# Patient Record
Sex: Female | Born: 1960 | Race: White | Hispanic: No | Marital: Married | Smoking: Never smoker
Health system: Southern US, Community
[De-identification: ages and names within clinical notes are randomized; demographics above are authoritative.]

## PROBLEM LIST (undated history)

## (undated) DIAGNOSIS — G43109 Migraine with aura, not intractable, without status migrainosus: Secondary | ICD-10-CM

## (undated) DIAGNOSIS — J45909 Unspecified asthma, uncomplicated: Secondary | ICD-10-CM

## (undated) DIAGNOSIS — Z9889 Other specified postprocedural states: Secondary | ICD-10-CM

## (undated) DIAGNOSIS — R112 Nausea with vomiting, unspecified: Secondary | ICD-10-CM

## (undated) DIAGNOSIS — L509 Urticaria, unspecified: Secondary | ICD-10-CM

## (undated) DIAGNOSIS — E7800 Pure hypercholesterolemia, unspecified: Secondary | ICD-10-CM

## (undated) DIAGNOSIS — C50919 Malignant neoplasm of unspecified site of unspecified female breast: Secondary | ICD-10-CM

## (undated) DIAGNOSIS — E0590 Thyrotoxicosis, unspecified without thyrotoxic crisis or storm: Secondary | ICD-10-CM

## (undated) DIAGNOSIS — N900 Mild vulvar dysplasia: Secondary | ICD-10-CM

## (undated) DIAGNOSIS — J069 Acute upper respiratory infection, unspecified: Secondary | ICD-10-CM

## (undated) HISTORY — DX: Migraine with aura, not intractable, without status migrainosus: G43.109

## (undated) HISTORY — DX: Malignant neoplasm of unspecified site of unspecified female breast: C50.919

## (undated) HISTORY — DX: Mild vulvar dysplasia: N90.0

## (undated) HISTORY — DX: Urticaria, unspecified: L50.9

## (undated) HISTORY — DX: Thyrotoxicosis, unspecified without thyrotoxic crisis or storm: E05.90

## (undated) HISTORY — PX: MASTECTOMY: SHX3

## (undated) HISTORY — PX: AUGMENTATION MAMMAPLASTY: SUR837

## (undated) HISTORY — PX: BREAST SURGERY: SHX581

## (undated) HISTORY — DX: Pure hypercholesterolemia, unspecified: E78.00

## (undated) HISTORY — DX: Acute upper respiratory infection, unspecified: J06.9

---

## 1997-09-11 ENCOUNTER — Other Ambulatory Visit: Admission: RE | Admit: 1997-09-11 | Discharge: 1997-09-11 | Payer: Self-pay | Admitting: Gynecology

## 1998-07-13 ENCOUNTER — Other Ambulatory Visit: Admission: RE | Admit: 1998-07-13 | Discharge: 1998-07-13 | Payer: Self-pay | Admitting: Gynecology

## 1999-09-19 ENCOUNTER — Other Ambulatory Visit: Admission: RE | Admit: 1999-09-19 | Discharge: 1999-09-19 | Payer: Self-pay | Admitting: Gynecology

## 2001-01-12 ENCOUNTER — Other Ambulatory Visit: Admission: RE | Admit: 2001-01-12 | Discharge: 2001-01-12 | Payer: Self-pay | Admitting: Gynecology

## 2001-01-21 ENCOUNTER — Ambulatory Visit (HOSPITAL_COMMUNITY): Admission: RE | Admit: 2001-01-21 | Discharge: 2001-01-21 | Payer: Self-pay | Admitting: Gynecology

## 2001-01-21 ENCOUNTER — Encounter: Payer: Self-pay | Admitting: Gynecology

## 2001-12-28 ENCOUNTER — Other Ambulatory Visit: Admission: RE | Admit: 2001-12-28 | Discharge: 2001-12-28 | Payer: Self-pay | Admitting: Gynecology

## 2002-02-09 ENCOUNTER — Encounter: Payer: Self-pay | Admitting: Gynecology

## 2002-02-09 ENCOUNTER — Ambulatory Visit (HOSPITAL_COMMUNITY): Admission: RE | Admit: 2002-02-09 | Discharge: 2002-02-09 | Payer: Self-pay | Admitting: Gynecology

## 2003-01-17 ENCOUNTER — Other Ambulatory Visit: Admission: RE | Admit: 2003-01-17 | Discharge: 2003-01-17 | Payer: Self-pay | Admitting: Gynecology

## 2003-01-30 ENCOUNTER — Encounter: Admission: RE | Admit: 2003-01-30 | Discharge: 2003-01-30 | Payer: Self-pay | Admitting: Family Medicine

## 2003-01-30 ENCOUNTER — Encounter (INDEPENDENT_AMBULATORY_CARE_PROVIDER_SITE_OTHER): Payer: Self-pay | Admitting: *Deleted

## 2003-01-30 ENCOUNTER — Encounter: Payer: Self-pay | Admitting: Family Medicine

## 2003-05-04 ENCOUNTER — Encounter: Admission: RE | Admit: 2003-05-04 | Discharge: 2003-05-04 | Payer: Self-pay | Admitting: Gynecology

## 2004-02-19 ENCOUNTER — Other Ambulatory Visit: Admission: RE | Admit: 2004-02-19 | Discharge: 2004-02-19 | Payer: Self-pay | Admitting: Obstetrics and Gynecology

## 2004-05-31 ENCOUNTER — Ambulatory Visit (HOSPITAL_COMMUNITY): Admission: RE | Admit: 2004-05-31 | Discharge: 2004-05-31 | Payer: Self-pay | Admitting: Gynecology

## 2004-12-17 DIAGNOSIS — N900 Mild vulvar dysplasia: Secondary | ICD-10-CM

## 2004-12-17 HISTORY — DX: Mild vulvar dysplasia: N90.0

## 2005-02-19 ENCOUNTER — Other Ambulatory Visit: Admission: RE | Admit: 2005-02-19 | Discharge: 2005-02-19 | Payer: Self-pay | Admitting: Gynecology

## 2005-08-21 ENCOUNTER — Ambulatory Visit (HOSPITAL_COMMUNITY): Admission: RE | Admit: 2005-08-21 | Discharge: 2005-08-21 | Payer: Self-pay | Admitting: Gynecology

## 2005-09-05 ENCOUNTER — Encounter: Admission: RE | Admit: 2005-09-05 | Discharge: 2005-09-05 | Payer: Self-pay | Admitting: Gynecology

## 2006-02-23 ENCOUNTER — Other Ambulatory Visit: Admission: RE | Admit: 2006-02-23 | Discharge: 2006-02-23 | Payer: Self-pay | Admitting: Gynecology

## 2006-05-19 HISTORY — PX: CARDIAC CATHETERIZATION: SHX172

## 2006-06-25 ENCOUNTER — Ambulatory Visit: Payer: Self-pay | Admitting: Family Medicine

## 2006-07-17 ENCOUNTER — Ambulatory Visit: Payer: Self-pay | Admitting: Family Medicine

## 2006-07-29 ENCOUNTER — Ambulatory Visit: Payer: Self-pay | Admitting: Cardiology

## 2006-07-29 LAB — CONVERTED CEMR LAB
BUN: 10 mg/dL (ref 6–23)
Basophils Absolute: 0 10*3/uL (ref 0.0–0.1)
Basophils Relative: 0.4 % (ref 0.0–1.0)
CO2: 25 meq/L (ref 19–32)
Calcium: 9.3 mg/dL (ref 8.4–10.5)
Chloride: 105 meq/L (ref 96–112)
Cholesterol: 183 mg/dL (ref 0–200)
Creatinine, Ser: 0.8 mg/dL (ref 0.4–1.2)
Eosinophils Absolute: 0.1 10*3/uL (ref 0.0–0.6)
Eosinophils Relative: 1.2 % (ref 0.0–5.0)
GFR calc Af Amer: 100 mL/min
GFR calc non Af Amer: 82 mL/min
Glucose, Bld: 97 mg/dL (ref 70–99)
HCT: 39.6 % (ref 36.0–46.0)
HDL: 60.8 mg/dL (ref >39.0)
Hemoglobin: 13.9 g/dL (ref 12.0–15.0)
Hgb A1c MFr Bld: 5.4 % (ref 4.6–6.0)
INR: 0.9 (ref 0.9–2.0)
LDL Cholesterol: 91 mg/dL (ref 0–99)
Lymphocytes Relative: 23.9 % (ref 12.0–46.0)
MCHC: 35 g/dL (ref 30.0–36.0)
MCV: 89.8 fL (ref 78.0–100.0)
Monocytes Absolute: 0.3 10*3/uL (ref 0.2–0.7)
Monocytes Relative: 5.2 % (ref 3.0–11.0)
Neutro Abs: 4.6 10*3/uL (ref 1.4–7.7)
Neutrophils Relative %: 69.3 % (ref 43.0–77.0)
Platelets: 285 10*3/uL (ref 150–400)
Potassium: 3.9 meq/L (ref 3.5–5.1)
Prothrombin Time: 11.7 s (ref 10.0–14.0)
RBC: 4.42 M/uL (ref 3.87–5.11)
RDW: 11.9 % (ref 11.5–14.6)
Sodium: 140 meq/L (ref 135–145)
Total CHOL/HDL Ratio: 3
Triglycerides: 154 mg/dL — ABNORMAL HIGH (ref 0–149)
VLDL: 31 mg/dL (ref 0–40)
WBC: 6.6 10*3/uL (ref 4.5–10.5)
aPTT: 29.5 s (ref 26.5–36.5)

## 2006-08-04 ENCOUNTER — Inpatient Hospital Stay (HOSPITAL_BASED_OUTPATIENT_CLINIC_OR_DEPARTMENT_OTHER): Admission: RE | Admit: 2006-08-04 | Discharge: 2006-08-04 | Payer: Self-pay | Admitting: Cardiovascular Disease

## 2006-08-04 ENCOUNTER — Ambulatory Visit: Payer: Self-pay | Admitting: Cardiovascular Disease

## 2006-08-14 ENCOUNTER — Ambulatory Visit: Payer: Self-pay

## 2006-08-14 ENCOUNTER — Encounter: Payer: Self-pay | Admitting: Cardiology

## 2006-09-10 ENCOUNTER — Ambulatory Visit: Payer: Self-pay | Admitting: Cardiology

## 2007-02-25 ENCOUNTER — Other Ambulatory Visit: Admission: RE | Admit: 2007-02-25 | Discharge: 2007-02-25 | Payer: Self-pay | Admitting: Gynecology

## 2008-03-17 ENCOUNTER — Ambulatory Visit: Payer: Self-pay | Admitting: Gynecology

## 2008-03-17 ENCOUNTER — Encounter: Payer: Self-pay | Admitting: Gynecology

## 2008-03-17 ENCOUNTER — Other Ambulatory Visit: Admission: RE | Admit: 2008-03-17 | Discharge: 2008-03-17 | Payer: Self-pay | Admitting: Gynecology

## 2008-06-07 ENCOUNTER — Ambulatory Visit: Payer: Self-pay | Admitting: Gynecology

## 2008-06-13 ENCOUNTER — Encounter: Admission: RE | Admit: 2008-06-13 | Discharge: 2008-06-13 | Payer: Self-pay | Admitting: Gynecology

## 2008-10-19 ENCOUNTER — Ambulatory Visit: Payer: Self-pay | Admitting: Gynecology

## 2008-11-16 ENCOUNTER — Ambulatory Visit: Payer: Self-pay | Admitting: Gynecology

## 2009-01-12 DIAGNOSIS — Z8742 Personal history of other diseases of the female genital tract: Secondary | ICD-10-CM | POA: Insufficient documentation

## 2009-01-12 DIAGNOSIS — K219 Gastro-esophageal reflux disease without esophagitis: Secondary | ICD-10-CM | POA: Insufficient documentation

## 2009-01-12 DIAGNOSIS — R1110 Vomiting, unspecified: Secondary | ICD-10-CM | POA: Insufficient documentation

## 2009-01-12 DIAGNOSIS — R110 Nausea: Secondary | ICD-10-CM | POA: Insufficient documentation

## 2009-01-12 DIAGNOSIS — R109 Unspecified abdominal pain: Secondary | ICD-10-CM | POA: Insufficient documentation

## 2009-01-18 ENCOUNTER — Ambulatory Visit: Payer: Self-pay | Admitting: Internal Medicine

## 2009-01-18 DIAGNOSIS — R195 Other fecal abnormalities: Secondary | ICD-10-CM | POA: Insufficient documentation

## 2009-01-18 DIAGNOSIS — R635 Abnormal weight gain: Secondary | ICD-10-CM | POA: Insufficient documentation

## 2009-01-18 DIAGNOSIS — K5900 Constipation, unspecified: Secondary | ICD-10-CM | POA: Insufficient documentation

## 2009-01-19 LAB — CONVERTED CEMR LAB
ALT: 24 units/L (ref 0–35)
AST: 21 units/L (ref 0–37)
Albumin: 3.9 g/dL (ref 3.5–5.2)
Alkaline Phosphatase: 56 units/L (ref 39–117)
Basophils Absolute: 0 10*3/uL (ref 0.0–0.1)
Basophils Relative: 0.1 % (ref 0.0–3.0)
Bilirubin, Direct: 0.1 mg/dL (ref 0.0–0.3)
Eosinophils Absolute: 0.1 10*3/uL (ref 0.0–0.7)
Eosinophils Relative: 1.1 % (ref 0.0–5.0)
HCT: 38.1 % (ref 36.0–46.0)
Hemoglobin: 13.2 g/dL (ref 12.0–15.0)
Lymphocytes Relative: 28.7 % (ref 12.0–46.0)
Lymphs Abs: 1.7 10*3/uL (ref 0.7–4.0)
MCHC: 34.7 g/dL (ref 30.0–36.0)
MCV: 92.1 fL (ref 78.0–100.0)
Monocytes Absolute: 0.3 10*3/uL (ref 0.1–1.0)
Monocytes Relative: 5.4 % (ref 3.0–12.0)
Neutro Abs: 3.7 10*3/uL (ref 1.4–7.7)
Neutrophils Relative %: 64.7 % (ref 43.0–77.0)
Platelets: 250 10*3/uL (ref 150.0–400.0)
RBC: 4.14 M/uL (ref 3.87–5.11)
RDW: 12.2 % (ref 11.5–14.6)
Total Bilirubin: 0.6 mg/dL (ref 0.3–1.2)
Total Protein: 7.2 g/dL (ref 6.0–8.3)
WBC: 5.8 10*3/uL (ref 4.5–10.5)

## 2009-01-23 ENCOUNTER — Ambulatory Visit (HOSPITAL_COMMUNITY): Admission: RE | Admit: 2009-01-23 | Discharge: 2009-01-23 | Payer: Self-pay | Admitting: Internal Medicine

## 2009-03-28 ENCOUNTER — Encounter: Payer: Self-pay | Admitting: Gynecology

## 2009-03-28 ENCOUNTER — Other Ambulatory Visit: Admission: RE | Admit: 2009-03-28 | Discharge: 2009-03-28 | Payer: Self-pay | Admitting: Gynecology

## 2009-03-28 ENCOUNTER — Ambulatory Visit: Payer: Self-pay | Admitting: Gynecology

## 2009-08-19 ENCOUNTER — Emergency Department (HOSPITAL_COMMUNITY): Admission: EM | Admit: 2009-08-19 | Discharge: 2009-08-19 | Payer: Self-pay | Admitting: Emergency Medicine

## 2010-04-01 ENCOUNTER — Ambulatory Visit: Payer: Self-pay | Admitting: Gynecology

## 2010-04-01 ENCOUNTER — Other Ambulatory Visit: Admission: RE | Admit: 2010-04-01 | Discharge: 2010-04-01 | Payer: Self-pay | Admitting: Gynecology

## 2010-06-08 ENCOUNTER — Encounter: Payer: Self-pay | Admitting: Gynecology

## 2010-06-09 ENCOUNTER — Encounter: Payer: Self-pay | Admitting: Gynecology

## 2010-06-09 ENCOUNTER — Encounter: Payer: Self-pay | Admitting: Sports Medicine

## 2010-06-18 NOTE — Assessment & Plan Note (Signed)
Summary: nausea,burning in throat,vomitting,stomach pain.Marland Kitchenem   History of Present Illness Visit Type: new patient  Primary GI MD: Lina Sar MD Primary Provider: Colin Broach, MD  Requesting Provider: n/a Chief Complaint: Pt states BRB in stool after BM's, nausea, vomiting, and lower abd pain History of Present Illness:   This is a 50 year old white female with several months history of burning epigastric pain and nocturnal abdominal pain radiating to the back. She has developed severe food intolerances. She had an attack of abdominal pain after having barbecue for supper. There is no fever or jaundice. There is strong family history of gallbladder disease in patient's mother, father and sister. She also has occasional dysphagia to liquids and solids but mostly solids. Patient has taken TUMS with only partial relief. There has been a significant weight gain recently. She also complains of constipation. Patient denies any stress.   GI Review of Systems    Reports abdominal pain, acid reflux, bloating, heartburn, nausea, vomiting, and  weight gain.     Location of  Abdominal pain: lower abdomen.    Denies belching, chest pain, dysphagia with liquids, dysphagia with solids, loss of appetite, vomiting blood, and  weight loss.      Reports constipation, hemorrhoids, and  rectal bleeding.     Denies anal fissure, black tarry stools, change in bowel habit, diarrhea, diverticulosis, fecal incontinence, heme positive stool, irritable bowel syndrome, jaundice, light color stool, liver problems, and  rectal pain.    Current Medications (verified): 1)  None  Allergies (verified): No Known Drug Allergies  Past History:  Past Medical History: Current Problems:  WEIGHT GAIN (ICD-783.1) BLOOD IN STOOL, OCCULT (ICD-792.1) CONSTIPATION (ICD-564.00) GERD (ICD-530.81) ABDOMINAL PAIN, UNSPECIFIED SITE (ICD-789.00) VOMITING (ICD-787.03) NAUSEA (ICD-787.02)  DIABETES MELLITUS, GESTATIONAL, HX OF (ICD-V13.29)      Past Surgical History: Reviewed history from 01/12/2009 and no changes required. Unremarkable  Family History: Family History of Heart Disease: Father Family History of Diabetes: Mom, Dad, and Sister  Family History of Colon Cancer:MGF   Social History: Patient has never smoked.  Alcohol Use - no Illicit Drug Use - no Occupation: Self employed  married 3 childern  Daily Caffeine Use: 2 daily   Review of Systems       The patient complains of back pain and night sweats.  The patient denies allergy/sinus, anemia, anxiety-new, arthritis/joint pain, blood in urine, breast changes/lumps, change in vision, confusion, cough, coughing up blood, depression-new, fainting, fatigue, fever, headaches-new, hearing problems, heart murmur, heart rhythm changes, itching, menstrual pain, muscle pains/cramps, nosebleeds, pregnancy symptoms, shortness of breath, skin rash, sleeping problems, sore throat, swelling of feet/legs, swollen lymph glands, thirst - excessive , urination - excessive , urination changes/pain, urine leakage, vision changes, and voice change.         Pertinent positive and negative review of systems were noted in the above HPI. All other ROS was otherwise negative.   Vital Signs:  Patient profile:   50 year old female Height:      65 inches Weight:      163 pounds BMI:     27.22 BSA:     1.82 Pulse rate:   74 / minute Pulse rhythm:   regular BP sitting:   132 / 80  (left arm) Cuff size:   regular  Vitals Entered By: Ok Anis CMA (January 18, 2009 9:33 AM)  Physical Exam  General:  very nice, pleasant, alert and oriented. Eyes:  nonicteric. Mouth:  no ulcers. Neck:  Supple; no  masses or thyromegaly. Lungs:  Clear throughout to auscultation. Heart:  Regular rate and rhythm; no murmurs, rubs,  or bruits.  Abdomen:  soft abdomen with diffuse tenderness in all quadrants but more so in the epigastrium and along the right costal margin with more pain on inspiration. No CVA tenderness. Rectal:  deferred due to patient being on her menstrual cycle.   Impression & Recommendations:  Problem # 1:  BLOOD IN STOOL, OCCULT (ICD-792.1) Patient will have a CBC today. A rectal exam could not be done due to patient having her menstrual cycle.  Problem # 2:  ABDOMINAL PAIN, UNSPECIFIED SITE (ICD-789.00) Patient's dyspepsia and abdominal pain is suggestive of either biliary dysfunction or gastritis. We need to rule out peptic ulcer disease. She will be started on Prevacid 15 mg a day and will be scheduled for an upper endoscopy. We will obtain liver function tests and a CBC as well. Orders: TLB-CBC Platelet - w/Differential (85025-CBCD) TLB-Hepatic/Liver Function Pnl (80076-HEPATIC) EGD (EGD) Ultrasound Abdomen (UAS)  Patient Instructions: 1)  Prevacid 15 mg by mouth once daily. 2)  Abdominal ultrasound. 3)  Liver function tests and CBC today. 4)  Low-fat diet. 5)  Upper endoscopy with biopsies for H. pylori. Prescriptions: PROMETHAZINE HCL 25 MG TABS (PROMETHAZINE HCL) Take 1 tablet by mouth every 6 hours as needed for nausea  #20 x 0   Entered by:   Hortense Ramal CMA (AAMA)   Authorized by:   Hart Carwin MD   Signed by:   Hortense Ramal CMA (AAMA) on 01/18/2009   Method used:   Electronically to        [facility]  [address] [phone]* (retail)       [address]       [place], [place]  [address]       Ph: [phone]       Fax: [phone]   RxID:   [id number]

## 2010-08-02 ENCOUNTER — Emergency Department (HOSPITAL_COMMUNITY): Payer: BC Managed Care – PPO

## 2010-08-02 ENCOUNTER — Emergency Department (HOSPITAL_COMMUNITY)
Admission: EM | Admit: 2010-08-02 | Discharge: 2010-08-03 | Disposition: A | Discharge status: Home / Self Care | Payer: BC Managed Care – PPO | Attending: Emergency Medicine | Admitting: Emergency Medicine

## 2010-08-02 DIAGNOSIS — R42 Dizziness and giddiness: Secondary | ICD-10-CM | POA: Insufficient documentation

## 2010-08-02 DIAGNOSIS — I10 Essential (primary) hypertension: Secondary | ICD-10-CM | POA: Insufficient documentation

## 2010-08-02 DIAGNOSIS — R0602 Shortness of breath: Secondary | ICD-10-CM | POA: Insufficient documentation

## 2010-08-02 DIAGNOSIS — E7800 Pure hypercholesterolemia, unspecified: Secondary | ICD-10-CM | POA: Insufficient documentation

## 2010-08-02 DIAGNOSIS — R55 Syncope and collapse: Secondary | ICD-10-CM | POA: Insufficient documentation

## 2010-08-02 DIAGNOSIS — R079 Chest pain, unspecified: Secondary | ICD-10-CM | POA: Insufficient documentation

## 2010-08-02 DIAGNOSIS — Z79899 Other long term (current) drug therapy: Secondary | ICD-10-CM | POA: Insufficient documentation

## 2010-08-02 DIAGNOSIS — R110 Nausea: Secondary | ICD-10-CM | POA: Insufficient documentation

## 2010-08-02 LAB — POCT I-STAT, CHEM 8
BUN: 11 mg/dL (ref 6–23)
Calcium, Ion: 1.03 mmol/L — ABNORMAL LOW (ref 1.12–1.32)
Chloride: 106 mEq/L (ref 96–112)
Creatinine, Ser: 0.8 mg/dL (ref 0.4–1.2)
Glucose, Bld: 111 mg/dL — ABNORMAL HIGH (ref 70–99)
HCT: 40 % (ref 36.0–46.0)
Hemoglobin: 13.6 g/dL (ref 12.0–15.0)
Potassium: 3.1 mEq/L — ABNORMAL LOW (ref 3.5–5.1)
Sodium: 137 mEq/L (ref 135–145)
TCO2: 19 mmol/L (ref 0–100)

## 2010-08-02 LAB — CBC
HCT: 38.2 % (ref 36.0–46.0)
Hemoglobin: 13 g/dL (ref 12.0–15.0)
MCH: 29.7 pg (ref 26.0–34.0)
MCHC: 34 g/dL (ref 30.0–36.0)
MCV: 87.2 fL (ref 78.0–100.0)
Platelets: 236 10*3/uL (ref 150–400)
RBC: 4.38 MIL/uL (ref 3.87–5.11)
RDW: 12.5 % (ref 11.5–15.5)
WBC: 9.7 10*3/uL (ref 4.0–10.5)

## 2010-08-02 LAB — DIFFERENTIAL
Basophils Absolute: 0 10*3/uL (ref 0.0–0.1)
Basophils Relative: 0 % (ref 0–1)
Eosinophils Absolute: 0.1 10*3/uL (ref 0.0–0.7)
Eosinophils Relative: 1 % (ref 0–5)
Lymphocytes Relative: 23 % (ref 12–46)
Lymphs Abs: 2.3 10*3/uL (ref 0.7–4.0)
Monocytes Absolute: 0.5 10*3/uL (ref 0.1–1.0)
Monocytes Relative: 5 % (ref 3–12)
Neutro Abs: 6.9 10*3/uL (ref 1.7–7.7)
Neutrophils Relative %: 71 % (ref 43–77)

## 2010-08-02 LAB — APTT: aPTT: 27 seconds (ref 24–37)

## 2010-08-02 LAB — POCT CARDIAC MARKERS
CKMB, poc: <1 ng/mL — ABNORMAL LOW (ref 1.0–8.0)
Myoglobin, poc: 115 ng/mL (ref 12–200)
Troponin i, poc: 0.1 ng/mL — ABNORMAL HIGH (ref 0.00–0.09)

## 2010-08-02 LAB — PROTIME-INR
INR: 0.98 (ref 0.00–1.49)
Prothrombin Time: 13.2 seconds (ref 11.6–15.2)

## 2010-08-02 LAB — CK TOTAL AND CKMB (NOT AT ARMC)
CK, MB: 0.9 ng/mL (ref 0.3–4.0)
Relative Index: INVALID (ref 0.0–2.5)
Total CK: 68 U/L (ref 7–177)

## 2010-08-02 LAB — TROPONIN I: Troponin I: 0.01 ng/mL (ref 0.00–0.06)

## 2010-08-07 LAB — DIFFERENTIAL
Basophils Absolute: 0 10*3/uL (ref 0.0–0.1)
Basophils Relative: 1 % (ref 0–1)
Eosinophils Absolute: 0.1 10*3/uL (ref 0.0–0.7)
Eosinophils Relative: 1 % (ref 0–5)
Lymphocytes Relative: 25 % (ref 12–46)
Lymphs Abs: 1.8 10*3/uL (ref 0.7–4.0)
Monocytes Absolute: 0.3 10*3/uL (ref 0.1–1.0)
Monocytes Relative: 4 % (ref 3–12)
Neutro Abs: 5.2 10*3/uL (ref 1.7–7.7)
Neutrophils Relative %: 70 % (ref 43–77)

## 2010-08-07 LAB — POCT I-STAT, CHEM 8
BUN: 10 mg/dL (ref 6–23)
Calcium, Ion: 1.13 mmol/L (ref 1.12–1.32)
Chloride: 106 mEq/L (ref 96–112)
Creatinine, Ser: 0.5 mg/dL (ref 0.4–1.2)
Glucose, Bld: 102 mg/dL — ABNORMAL HIGH (ref 70–99)
HCT: 43 % (ref 36.0–46.0)
Hemoglobin: 14.6 g/dL (ref 12.0–15.0)
Potassium: 3.4 mEq/L — ABNORMAL LOW (ref 3.5–5.1)
Sodium: 139 mEq/L (ref 135–145)
TCO2: 23 mmol/L (ref 0–100)

## 2010-08-07 LAB — POCT CARDIAC MARKERS
CKMB, poc: <1 ng/mL — ABNORMAL LOW (ref 1.0–8.0)
CKMB, poc: <1 ng/mL — ABNORMAL LOW (ref 1.0–8.0)
Myoglobin, poc: 31.4 ng/mL (ref 12–200)
Myoglobin, poc: 48 ng/mL (ref 12–200)
Troponin i, poc: <0.05 ng/mL (ref 0.00–0.09)
Troponin i, poc: <0.05 ng/mL (ref 0.00–0.09)

## 2010-08-07 LAB — CBC
HCT: 41.2 % (ref 36.0–46.0)
Hemoglobin: 14.2 g/dL (ref 12.0–15.0)
MCHC: 34.5 g/dL (ref 30.0–36.0)
MCV: 91.6 fL (ref 78.0–100.0)
Platelets: 254 10*3/uL (ref 150–400)
RBC: 4.49 MIL/uL (ref 3.87–5.11)
RDW: 12.5 % (ref 11.5–15.5)
WBC: 7.4 10*3/uL (ref 4.0–10.5)

## 2010-08-07 LAB — PROTIME-INR
INR: 0.99 (ref 0.00–1.49)
Prothrombin Time: 13 seconds (ref 11.6–15.2)

## 2010-08-07 LAB — POCT PREGNANCY, URINE: Preg Test, Ur: NEGATIVE

## 2010-08-07 LAB — URINALYSIS, ROUTINE W REFLEX MICROSCOPIC
Bilirubin Urine: NEGATIVE
Glucose, UA: NEGATIVE mg/dL
Hgb urine dipstick: NEGATIVE
Ketones, ur: NEGATIVE mg/dL
Nitrite: NEGATIVE
Protein, ur: NEGATIVE mg/dL
Specific Gravity, Urine: 1.012 (ref 1.005–1.030)
Urobilinogen, UA: 0.2 mg/dL (ref 0.0–1.0)
pH: 5.5 (ref 5.0–8.0)

## 2010-08-12 ENCOUNTER — Emergency Department (HOSPITAL_COMMUNITY)
Admission: EM | Admit: 2010-08-12 | Discharge: 2010-08-12 | Disposition: A | Discharge status: Home / Self Care | Payer: BC Managed Care – PPO | Attending: Emergency Medicine | Admitting: Emergency Medicine

## 2010-08-12 DIAGNOSIS — R197 Diarrhea, unspecified: Secondary | ICD-10-CM | POA: Insufficient documentation

## 2010-08-12 DIAGNOSIS — R002 Palpitations: Secondary | ICD-10-CM | POA: Insufficient documentation

## 2010-08-12 DIAGNOSIS — I10 Essential (primary) hypertension: Secondary | ICD-10-CM | POA: Insufficient documentation

## 2010-08-12 DIAGNOSIS — E7800 Pure hypercholesterolemia, unspecified: Secondary | ICD-10-CM | POA: Insufficient documentation

## 2010-08-12 DIAGNOSIS — R109 Unspecified abdominal pain: Secondary | ICD-10-CM | POA: Insufficient documentation

## 2010-08-12 DIAGNOSIS — R10819 Abdominal tenderness, unspecified site: Secondary | ICD-10-CM | POA: Insufficient documentation

## 2010-08-12 DIAGNOSIS — R112 Nausea with vomiting, unspecified: Secondary | ICD-10-CM | POA: Insufficient documentation

## 2010-08-12 DIAGNOSIS — Z79899 Other long term (current) drug therapy: Secondary | ICD-10-CM | POA: Insufficient documentation

## 2010-08-12 DIAGNOSIS — R079 Chest pain, unspecified: Secondary | ICD-10-CM | POA: Insufficient documentation

## 2010-08-12 LAB — CBC
HCT: 39.9 % (ref 36.0–46.0)
Hemoglobin: 13.7 g/dL (ref 12.0–15.0)
MCH: 30.8 pg (ref 26.0–34.0)
MCHC: 34.3 g/dL (ref 30.0–36.0)
MCV: 89.7 fL (ref 78.0–100.0)
Platelets: 221 10*3/uL (ref 150–400)
RBC: 4.45 MIL/uL (ref 3.87–5.11)
RDW: 12.7 % (ref 11.5–15.5)
WBC: 10.8 10*3/uL — ABNORMAL HIGH (ref 4.0–10.5)

## 2010-08-12 LAB — DIFFERENTIAL
Basophils Absolute: 0 10*3/uL (ref 0.0–0.1)
Basophils Relative: 0 % (ref 0–1)
Eosinophils Absolute: 0.1 10*3/uL (ref 0.0–0.7)
Eosinophils Relative: 1 % (ref 0–5)
Lymphocytes Relative: 6 % — ABNORMAL LOW (ref 12–46)
Lymphs Abs: 0.7 10*3/uL (ref 0.7–4.0)
Monocytes Absolute: 0.6 10*3/uL (ref 0.1–1.0)
Monocytes Relative: 5 % (ref 3–12)
Neutro Abs: 9.5 10*3/uL — ABNORMAL HIGH (ref 1.7–7.7)
Neutrophils Relative %: 88 % — ABNORMAL HIGH (ref 43–77)

## 2010-08-12 LAB — URINALYSIS, ROUTINE W REFLEX MICROSCOPIC
Bilirubin Urine: NEGATIVE
Glucose, UA: NEGATIVE mg/dL
Hgb urine dipstick: NEGATIVE
Ketones, ur: NEGATIVE mg/dL
Nitrite: NEGATIVE
Protein, ur: NEGATIVE mg/dL
Specific Gravity, Urine: 1.016 (ref 1.005–1.030)
Urobilinogen, UA: 0.2 mg/dL (ref 0.0–1.0)
pH: 7 (ref 5.0–8.0)

## 2010-08-12 LAB — POCT CARDIAC MARKERS
CKMB, poc: <1 ng/mL — ABNORMAL LOW (ref 1.0–8.0)
Myoglobin, poc: 34.5 ng/mL (ref 12–200)
Troponin i, poc: <0.05 ng/mL (ref 0.00–0.09)

## 2010-08-12 LAB — COMPREHENSIVE METABOLIC PANEL
ALT: 18 U/L (ref 0–35)
AST: 19 U/L (ref 0–37)
Albumin: 3.8 g/dL (ref 3.5–5.2)
Alkaline Phosphatase: 54 U/L (ref 39–117)
BUN: 17 mg/dL (ref 6–23)
CO2: 25 mEq/L (ref 19–32)
Calcium: 8.6 mg/dL (ref 8.4–10.5)
Chloride: 103 mEq/L (ref 96–112)
Creatinine, Ser: 0.67 mg/dL (ref 0.4–1.2)
GFR calc Af Amer: >60 mL/min (ref >60)
GFR calc non Af Amer: >60 mL/min (ref >60)
Glucose, Bld: 117 mg/dL — ABNORMAL HIGH (ref 70–99)
Potassium: 3.8 mEq/L (ref 3.5–5.1)
Sodium: 137 mEq/L (ref 135–145)
Total Bilirubin: 0.5 mg/dL (ref 0.3–1.2)
Total Protein: 6.4 g/dL (ref 6.0–8.3)

## 2010-08-15 ENCOUNTER — Other Ambulatory Visit (HOSPITAL_COMMUNITY): Payer: Self-pay | Admitting: Gastroenterology

## 2010-08-15 DIAGNOSIS — R112 Nausea with vomiting, unspecified: Secondary | ICD-10-CM

## 2010-08-15 DIAGNOSIS — R1084 Generalized abdominal pain: Secondary | ICD-10-CM

## 2010-08-26 ENCOUNTER — Ambulatory Visit
Admission: RE | Admit: 2010-08-26 | Discharge: 2010-08-26 | Disposition: A | Discharge status: Home / Self Care | Payer: BC Managed Care – PPO | Source: Ambulatory Visit | Attending: Gastroenterology | Admitting: Gastroenterology

## 2010-08-26 ENCOUNTER — Other Ambulatory Visit: Payer: Self-pay | Admitting: Gastroenterology

## 2010-08-26 DIAGNOSIS — R110 Nausea: Secondary | ICD-10-CM

## 2010-08-28 ENCOUNTER — Other Ambulatory Visit (HOSPITAL_COMMUNITY): Payer: BC Managed Care – PPO

## 2010-09-04 ENCOUNTER — Other Ambulatory Visit (HOSPITAL_COMMUNITY): Payer: BC Managed Care – PPO

## 2010-09-04 ENCOUNTER — Encounter (HOSPITAL_COMMUNITY): Payer: Self-pay

## 2010-09-04 ENCOUNTER — Encounter (HOSPITAL_COMMUNITY)
Admission: RE | Admit: 2010-09-04 | Discharge: 2010-09-04 | Disposition: A | Discharge status: Home / Self Care | Payer: BC Managed Care – PPO | Source: Ambulatory Visit | Attending: Gastroenterology | Admitting: Gastroenterology

## 2010-09-04 DIAGNOSIS — R112 Nausea with vomiting, unspecified: Secondary | ICD-10-CM | POA: Insufficient documentation

## 2010-09-04 DIAGNOSIS — R1084 Generalized abdominal pain: Secondary | ICD-10-CM

## 2010-09-04 DIAGNOSIS — R109 Unspecified abdominal pain: Secondary | ICD-10-CM | POA: Insufficient documentation

## 2010-09-04 MED ORDER — TECHNETIUM TC 99M MEBROFENIN IV KIT
5.3000 | PACK | Freq: Once | INTRAVENOUS | Status: AC | PRN
  Administered 2010-09-04: 5.3 via INTRAVENOUS

## 2010-09-04 MED ORDER — SINCALIDE 5 MCG IJ SOLR: 0.0200 ug/kg | Freq: Once | INTRAMUSCULAR | Status: DC

## 2010-09-05 ENCOUNTER — Other Ambulatory Visit (HOSPITAL_COMMUNITY): Payer: Self-pay | Admitting: Gastroenterology

## 2010-09-05 DIAGNOSIS — R1011 Right upper quadrant pain: Secondary | ICD-10-CM

## 2010-09-05 DIAGNOSIS — R112 Nausea with vomiting, unspecified: Secondary | ICD-10-CM

## 2010-09-10 ENCOUNTER — Other Ambulatory Visit: Payer: Self-pay | Admitting: Dermatology

## 2010-09-18 ENCOUNTER — Inpatient Hospital Stay (HOSPITAL_COMMUNITY): Admission: RE | Admit: 2010-09-18 | Payer: BC Managed Care – PPO | Source: Ambulatory Visit

## 2010-09-25 ENCOUNTER — Inpatient Hospital Stay (HOSPITAL_COMMUNITY): Admission: RE | Admit: 2010-09-25 | Payer: BC Managed Care – PPO | Source: Ambulatory Visit

## 2010-10-04 NOTE — Assessment & Plan Note (Signed)
[facility]                            CARDIOLOGY OFFICE NOTE   NAME:Brandy [name], Brandy [name]                        MRN:          [id number]  DATE:09/10/2006                            DOB:          03-05-1961    Brandy [name] returns today.  Her cardiac catheterization was normal.  There  was a question of some mild mitral valve prolapse.  She underwent a 2D  echocardiogram, which ruled out mitral valve prolapse.   She had been feeling dizzy and having headaches.   Her medicines are Toprol XL 25 mg a day.   Her blood pressure is elevated again today at 160/90.  I checked it with  a large cuff in the left arm, even after she had rested for 10 minutes.  Her heart rate is 90 and regular.  The rest of her exam is unchanged.   ASSESSMENT:  1. Essential hypertension.  2. Normal coronary arteries.  3. No evidence of mitral valve prolapse.   PLAN:  1. Increase Toprol XL to 50 mg a day.  2. Add lisinopril hydrochlorothiazide 10/12.5 q.a.m.  3. Check blood pressure cuffs at home.  4. Follow up with me in 2 weeks to review her blood pressure log, and      make any additional changes.     Thomas C. Daleen Squibb, MD, [facility]  Electronically Signed    TCW/MedQ  DD: 09/10/2006  DT: 09/10/2006  Job #: [id number]   cc:   Brandy A. Milinda Antis, MD

## 2010-10-04 NOTE — Cardiovascular Report (Signed)
NAME:  [name], [name]                 ACCOUNT NO.:  [id number]   MEDICAL RECORD NO.:  [id number]          PATIENT TYPE:  OIB   LOCATION:  1962                         FACILITY:  [facility]   PHYSICIAN:  Veverly Fells. Excell Seltzer, MD  DATE OF BIRTH:  1960-07-21   DATE OF PROCEDURE:  08/04/2006  DATE OF DISCHARGE:  08/04/2006                            CARDIAC CATHETERIZATION   PROCEDURE:  Left heart catheterization, selective coronary angiography,  left ventricular angiography.   Brandy [name] is a 50 year old woman who presented for evaluation of chest  discomfort.  She has a strong family history of premature coronary  artery disease in her father.  She has been experiencing frequent chest  pain over the past few weeks.  With her strong family history and  borderline hypertension and hyperlipidemia, she was referred for  diagnostic angiography.   Risks and indications of the procedure were reviewed with the patient.  Informed consent was obtained.  The right groin was prepped, draped and  anesthetized with 1% lidocaine.  A 4-French sheath was placed in the  right femoral artery using the modified Seldinger technique.  Multiple  views of the left and right coronary arteries were taken with standard 4-  French catheters.  Following selective coronary angiography, an angled  pigtail catheter was inserted into the left ventricle and pressures were  recorded.  A left ventriculogram was performed, then a pullback across  the aortic valve was done.  At the conclusion of the procedure, the  patient was taken to the recovery area, and there were no immediate  complications.   FINDINGS:  Aortic pressure 131/77 with a mean of 102, left ventricular  pressure 130/2 with an end-diastolic pressure of 6.   CORONARY ANGIOGRAPHY:  Left mainstem is angiographically normal.  It  bifurcates into the LAD and left circumflex.   The LAD is a large-caliber vessel that courses down to the LV apex.  It  gives off a  large diagonal branch from its midportion.  There are  multiple septal perforators.  There is no significant angiographic  disease in the LAD.   The left circumflex is large caliber.  It gives off a first OM branch,  as well as two posterolateral branches.  The true AV groove circumflex  is of medium caliber.  There is no significant angiographic disease  throughout the left circumflex system.   The right coronary artery is dominant.  It is a large vessel.  It  supplies a PDA and posterior AV segment that gives off one  posterolateral branch.  There is also an RV marginal branch from the  midportion of the right coronary artery.  There is no significant  angiographic disease in the right coronary artery.   Left ventriculogram performed in the 30 degree right anterior oblique  projection shows normal LV function with an estimated LVEF of 60%.  There is probable mitral valve prolapse present.   ASSESSMENT:  1. Normal coronary arteries.  2. Normal left ventricular function.  3. Probable mitral valve prolapse.   RECOMMENDATIONS:  Recommend a 2-D echocardiogram to rule out significant  mitral valve prolapse.  I do not appreciate any mitral regurgitation.  This may give an explanation for the patient's chest pain syndrome.  If  not, she may require further investigation into noncardiac chest pain.  The patient will follow-up with Dr. Milinda Antis as an outpatient.      Veverly Fells. Excell Seltzer, MD  Electronically Signed     MDC/MEDQ  D:  08/05/2006  T:  08/05/2006  Job:  [id number]   cc:   Thomas C. Wall, MD, [facility]  Marne A. Milinda Antis, MD

## 2010-10-04 NOTE — Assessment & Plan Note (Signed)
[facility]                            CARDIOLOGY OFFICE NOTE   NAME:[name], [name]                        MRN:          [id number]  DATE:07/29/2006                            DOB:          1961-04-08    I was asked by Dr. Milinda Antis to evaluate Brandy [name], a delightful 50-year-  old married white female with chest pressure and tightness going into  her neck and down her left arm.   HISTORY OF PRESENT ILLNESS:  Brandy [name] is a 50 year old married white  female from [place].  She is quite active and has 3 children; 1 is  getting ready to go to college in [place].   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  She works incredible hours.   Recently, she was walking about the house one evening and had sudden  onset of pressure, heaviness and tightness in her chest.  It went up  into her left shoulder and down her left arm.  It resolved on its own.  At the time, she did not have any reflux symptoms or indigestion.  She  has had some reflux on occasion, however.   Since that time, she has had some chest tightness and similar symptoms  with exertion; it has not been nearly as bad.  She has had no recent  symptoms.   PAST MEDICAL HISTORY:  She does not smoke, does not drink and does not  use any recreational drugs.  She does not know her lipid status.   She has had no surgery.   MEDICATIONS:  Her only medicine is Dianah Field, which is birth control.   FAMILY HISTORY:  Her father had coronary artery disease diagnosed at age  34; he has had 2 bypass procedures since then.  He is 17 and doing  poorly.   SOCIAL HISTORY:  As above.   REVIEW OF SYSTEMS:  Other than the HPI, she had asthma as a child.  She  also had gestational diabetes.   PHYSICAL EXAMINATION:  Her blood pressure is 150/93.  Her heart rate is  101 and regular.  EKG from the outside shows no acute changes.  She is 5  feet 5 inches and weighs 152  pounds.  HEENT:  Normocephalic, atraumatic.  PERRLA.  Extraocular movements  intact.  Sclerae are clear.  Facial symmetry is normal.  Dentition is  satisfactory.  Carotid upstrokes are equal bilaterally without bruits.  There is no  JVD.  Neck is supple.  Thyroid is not enlarged.  LUNGS:  Clear.  HEART:  Reveals a nondisplaced PMI.  She has a normal S1 and S2.  ABDOMEN:  Soft with good bowel sounds.  No midline bruit.  There is no  hepatomegaly.  EXTREMITIES:  No cyanosis, clubbing or edema.  Pulses are brisk.  NEUROLOGIC:  Exam is intact.  SKIN:  Intact.  MUSCULOSKELETAL:  Intact.   ASSESSMENT:  1. Exertional chest tightness with radiation to the left shoulder,      neck and down the arm.  She has had subsequent events  with some      exertion.  She has several cardiac risk factors including a strong      family history, probable hyperlipidemia, perhaps hypertension,      sedentary lifestyle and lots of stress.  2. Hypertension today.  3. Questionable hyperlipidemia.  4. History of gestational diabetes.   RECOMMENDATION:  I have recommended to Mrs. Sorenson that she have an  outpatient cardiac catheterization.  We will also get a comprehensive  metabolic panel, CBC, lipid panel and hemoglobin A1c.  I have started  her on aspirin as well as metoprolol 25 mg a day.  Indications, risks  and potential benefits have been discussed about the cath.  She agrees  to proceed early next week.   I have also talked to her about unstable angina or an acute coronary  syndrome and how to activate 911.     Thomas C. Daleen Squibb, MD, [facility]  Electronically Signed    TCW/MedQ  DD: 07/29/2006  DT: 07/31/2006  Job #: [id number]   cc:   Marne A. Milinda Antis, MD

## 2010-12-10 ENCOUNTER — Other Ambulatory Visit: Payer: Self-pay | Admitting: Gynecology

## 2010-12-10 DIAGNOSIS — Z1231 Encounter for screening mammogram for malignant neoplasm of breast: Secondary | ICD-10-CM

## 2010-12-17 ENCOUNTER — Ambulatory Visit: Payer: BC Managed Care – PPO

## 2011-03-28 ENCOUNTER — Encounter: Payer: Self-pay | Admitting: *Deleted

## 2011-03-28 DIAGNOSIS — N900 Mild vulvar dysplasia: Secondary | ICD-10-CM | POA: Insufficient documentation

## 2011-04-03 ENCOUNTER — Encounter: Payer: BC Managed Care – PPO | Admitting: Gynecology

## 2011-04-07 ENCOUNTER — Encounter: Payer: BC Managed Care – PPO | Admitting: Gynecology

## 2011-05-08 ENCOUNTER — Encounter: Payer: Self-pay | Admitting: Gynecology

## 2011-05-08 ENCOUNTER — Ambulatory Visit (INDEPENDENT_AMBULATORY_CARE_PROVIDER_SITE_OTHER): Payer: BC Managed Care – PPO | Admitting: Gynecology

## 2011-05-08 VITALS — BP 124/82 | Ht 65.0 in | Wt 155.0 lb

## 2011-05-08 DIAGNOSIS — F419 Anxiety disorder, unspecified: Secondary | ICD-10-CM

## 2011-05-08 DIAGNOSIS — F411 Generalized anxiety disorder: Secondary | ICD-10-CM

## 2011-05-08 DIAGNOSIS — Z01419 Encounter for gynecological examination (general) (routine) without abnormal findings: Secondary | ICD-10-CM

## 2011-05-08 MED ORDER — FLUOXETINE HCL 20 MG PO TABS: 20.0000 mg | ORAL_TABLET | Freq: Every day | ORAL | Status: DC

## 2011-05-08 NOTE — Progress Notes (Signed)
Brandy [name] 10/11/1960 [id number]        50 y.o.  for annual exam.  Menses continue heavy and she continues to contemplate hysterectomy.  Past medical history,surgical history, medications, allergies, family history and social history were all reviewed and documented in the EPIC chart. ROS:  Was performed and pertinent positives and negatives are included in the history.  Exam: chaperone present Filed Vitals:   05/08/11 0916  BP: 124/82   General appearance  Normal Skin grossly normal Head/Neck normal with no cervical or supraclavicular adenopathy thyroid normal Lungs  clear Cardiac RR, without RMG Abdominal  soft, nontender, without masses, organomegaly or hernia Breasts  examined lying and sitting without masses, retractions, discharge or axillary adenopathy. Pelvic  Ext/BUS/vagina  normal   Cervix  normal    Uterus  anteverted, normal size, shape and contour, midline and mobile nontender   Adnexa  Without masses or tenderness    Anus and perineum  normal   Rectovaginal  normal sphincter tone without palpated masses or tenderness.    Assessment/Plan:  50 y.o. female for annual exam.    1. Menorrhagia/dysmenorrhea. Menses continue heavy. She's been evaluated with hormonal studies sonohysterogram biopsy all which were negative. She continues to contemplate hysterectomy but is not ready to make that decision yet. She'll continue to monitor her cycles and follow up with me when she decides for definitive treatment. 2. Contraception. I again discussed her lack of contraception and the pregnancy risk and she understands and accepts this. Options have been discussed and declined. 3. Pap smear. She has no history of abnormal Pap smears. Her last Pap smear was 2011. I reviewed current screening guidelines and we'll plan on doing every 3 year Pap smears and no Pap smear was done today. 4. Breast health. SBE monthly reviewed. She has not had a mammogram in 2 years and she knows the  importance of doing so and agrees to schedule this.from 5. Anxiety. She was started on Prozac previously and has done well with this, wants to continue it, I refilled her times a year. 6. Health maintenance. She actively sees her primary who manages her medical issues and she'll continue to do so. No blood work was done today, this is all done through their office.     Dara Lords MD, 9:36 AM 05/08/2011

## 2011-05-08 NOTE — Patient Instructions (Signed)
Schedule mammogram. Follow for annual GYN exam in 1 year. Follow up sooner if you decide you want to proceed with hysterectomy.

## 2011-11-27 ENCOUNTER — Encounter: Payer: Self-pay | Admitting: Gynecology

## 2011-11-27 ENCOUNTER — Ambulatory Visit (INDEPENDENT_AMBULATORY_CARE_PROVIDER_SITE_OTHER): Payer: BC Managed Care – PPO | Admitting: Gynecology

## 2011-11-27 VITALS — BP 110/74

## 2011-11-27 DIAGNOSIS — N951 Menopausal and female climacteric states: Secondary | ICD-10-CM

## 2011-11-27 LAB — TSH: TSH: 2.636 u[IU]/mL (ref 0.350–4.500)

## 2011-11-27 MED ORDER — VENLAFAXINE HCL ER 37.5 MG PO CP24: 37.5000 mg | ORAL_CAPSULE | Freq: Every day | ORAL | Status: DC

## 2011-11-27 NOTE — Progress Notes (Signed)
Patient presents to discuss hormonal-type symptoms. She says she feels fatigued somewhat anxious having hot flushes some sweats. She's continuing on monthly heavy menses. No weight changes hair changes skin changes nausea vomiting diarrhea constipation. Had been on Prozac in the past but is on nothing from a psychoactive standpoint now.  Exam HEENT normal Lungs clear Cardiac regular rate no rubs murmurs or gallops  Assessment and plan: Hormonal-type perimenopausal symptoms. Still with regular menses. Will check TSH FSH. Options for management include observation, psychoactive medications such as Effexor/Paxil, HRT to include low-dose patch intermittent progesterone her continuous progesterone, low-dose oral contraceptives.  She is being followed for her hypertension although her blood pressure today is normal. She's also being seen for migraines. The risks of estrogen containing products to include the birth control pills and HRT of stroke heart attack DVT reviewed. I also discussed the increased risk of breast cancer with HRT. The issues of her migraines reviewed. After a lengthy discussion wait to try Effexor XR 37.5. She'll call me in a month we'll see how she's doing and then talk about dose adjustment to 75 mg. Side effect profile as well as suicide ideation precautions reviewed.

## 2011-11-27 NOTE — Patient Instructions (Signed)
Start on Effexor daily, call me in a month with follow up results. Office will call you with hormone results.

## 2011-11-28 LAB — FOLLICLE STIMULATING HORMONE: FSH: 10.3 m[IU]/mL

## 2011-12-02 ENCOUNTER — Telehealth: Payer: Self-pay | Admitting: *Deleted

## 2011-12-02 NOTE — Telephone Encounter (Signed)
Tell patient my preference would be to try the Effexor.  With her history of hypertension and migraine headaches am not sure low dose birth control pills would be the best choice particularly if she did not try Effexor first. 37.5 mg a low dose and I think she would be better off trying that oppose to birth control pills at this point.

## 2011-12-02 NOTE — Telephone Encounter (Signed)
Pt was giving Effexor xr 37.5 mg daily on office visit 11/27/11, pt decided she wold like to try birth control pills. Pt didn't like some of the side effects on the label. Please advise

## 2011-12-03 NOTE — Telephone Encounter (Signed)
Left message for pt to call.

## 2011-12-03 NOTE — Telephone Encounter (Signed)
Pt informed with the below note. 

## 2011-12-04 ENCOUNTER — Ambulatory Visit: Payer: BC Managed Care – PPO

## 2012-02-04 ENCOUNTER — Telehealth: Payer: Self-pay | Admitting: *Deleted

## 2012-02-04 MED ORDER — MEGESTROL ACETATE 40 MG PO TABS: 40.0000 mg | ORAL_TABLET | Freq: Two times a day (BID) | ORAL | Status: DC

## 2012-02-04 NOTE — Telephone Encounter (Signed)
Please make sure that patient does a home pregnancy test is negative. Since my partner will not be here to next week she's more than welcome to come in this week to be checked. If her pregnancy test is negative and she was to wait until next week to see Dr. Audie Box we can call her in Megace 40 mg one by mouth twice a day for 5 days until she sees him next week at which time he may want to do an endometrial biopsy.

## 2012-02-04 NOTE — Telephone Encounter (Signed)
Pt informed with all the below and will make ov with TF next week, rx sent to pharmacy.

## 2012-02-04 NOTE — Telephone Encounter (Signed)
Pt calling c/o bleeding x 6 weeks now, she had about 4 days of spotting then bleeding started back. Pt is calling requesting something to stop bleeding until TF returns to office to follow up. Please advise

## 2012-02-12 ENCOUNTER — Telehealth: Payer: Self-pay | Admitting: *Deleted

## 2012-02-12 MED ORDER — MEGESTROL ACETATE 20 MG PO TABS: 20.0000 mg | ORAL_TABLET | Freq: Every day | ORAL | Status: DC

## 2012-02-12 NOTE — Telephone Encounter (Signed)
Megace 20 mg daily, #15. Patient needs to have an appointment within a 2 week window to discuss and evaluate.

## 2012-02-12 NOTE — Telephone Encounter (Signed)
Follow up telephone encounter on 02/04/12. Pt took megace x 5 days, bleeding slowed down, but now starting to bleeding heavy again. Can pt have more megace until OV with you? She would like to schedule OV to discuss proceed with hysterectomy  as well. Please advise

## 2012-02-12 NOTE — Telephone Encounter (Signed)
Pt informed with the below note, she will make appointment. 

## 2012-02-26 ENCOUNTER — Ambulatory Visit (INDEPENDENT_AMBULATORY_CARE_PROVIDER_SITE_OTHER): Payer: BC Managed Care – PPO | Admitting: Gynecology

## 2012-02-26 ENCOUNTER — Encounter: Payer: Self-pay | Admitting: Gynecology

## 2012-02-26 DIAGNOSIS — N926 Irregular menstruation, unspecified: Secondary | ICD-10-CM

## 2012-02-26 DIAGNOSIS — N920 Excessive and frequent menstruation with regular cycle: Secondary | ICD-10-CM

## 2012-02-26 DIAGNOSIS — Z23 Encounter for immunization: Secondary | ICD-10-CM

## 2012-02-26 LAB — CBC WITH DIFFERENTIAL/PLATELET
Basophils Absolute: 0 10*3/uL (ref 0.0–0.1)
Basophils Relative: 1 % (ref 0–1)
Eosinophils Absolute: 0.1 10*3/uL (ref 0.0–0.7)
Eosinophils Relative: 2 % (ref 0–5)
HCT: 39.9 % (ref 36.0–46.0)
Hemoglobin: 13.8 g/dL (ref 12.0–15.0)
Lymphocytes Relative: 34 % (ref 12–46)
Lymphs Abs: 2 10*3/uL (ref 0.7–4.0)
MCH: 30.4 pg (ref 26.0–34.0)
MCHC: 34.6 g/dL (ref 30.0–36.0)
MCV: 87.9 fL (ref 78.0–100.0)
Monocytes Absolute: 0.3 10*3/uL (ref 0.1–1.0)
Monocytes Relative: 5 % (ref 3–12)
Neutro Abs: 3.3 10*3/uL (ref 1.7–7.7)
Neutrophils Relative %: 58 % (ref 43–77)
Platelets: 328 10*3/uL (ref 150–400)
RBC: 4.54 MIL/uL (ref 3.87–5.11)
RDW: 13.1 % (ref 11.5–15.5)
WBC: 5.7 10*3/uL (ref 4.0–10.5)

## 2012-02-26 MED ORDER — MEGESTROL ACETATE 20 MG PO TABS: 20.0000 mg | ORAL_TABLET | Freq: Every day | ORAL | Status: DC

## 2012-02-26 NOTE — Progress Notes (Signed)
Patient presents having bled on and off for the last 2 months. She has a history of menorrhagia and was planning hysterectomy but has postponed it over the last several years. She did have a biopsy with negative sonohysterogram several years ago and recently had normal TSH/FSH.  She was started on Megace for the bleeding over the phone and currently is on 20 mg daily. Bleeding has substantially declined.  Exam with Fleet Contras Asst. Abdomen soft nontender without masses guarding rebound organomegaly. Pelvic external BUS vagina with scant bleeding. Cervix normal. Uterus globoid grossly normal size midline mobile nontender. Adnexa without masses or tenderness  Assessment and plan: Menorrhagia now with irregular bleeding. We again discussed the situation. She does want to proceed with hysterectomy now wants to schedule this. The question is whether we should study her first to include ultrasound biopsy. I think if we're going to proceed with surgery within the next month or 2 that we do not need to do so as she is easy to examine and short of cancer it would not alter our plan. Given she was sampled several years ago which was negative and she was having regular monthly menses up until most recently I feel is reasonable to proceed with surgery. I discussed the ovarian conservation issue. The options to keep her ovaries for continued hormone production given she is 51 and the perimenopause question as to how much longer her ovaries will function versus the risk of ovarian cancer and benign ovarian disease. Alternative to remove her ovaries and the issues of hypoestrogenism accelerated cardiovascular risk in bone loss all reviewed. Patient desires to keep both ovaries and she accepts the risks of disease to include ovarian cancer in the future. I also discussed we will attempt vaginal hysterectomy given that she has had vaginal births but laparoscopic assisted up to including TAH possibilities were also reviewed if  surprise findings or complications during surgery. Patient wants to move toward scheduling. She'll stay on her Megace 20 mg daily through surgery we'll check CBC now given her bleeding history and she will follow up for a full preoperative consult

## 2012-02-26 NOTE — Patient Instructions (Signed)
Stay on Megace 20 mg daily through surgery. Office will contact you to arrange surgery.

## 2012-02-27 ENCOUNTER — Telehealth: Payer: Self-pay | Admitting: Gynecology

## 2012-02-27 NOTE — Telephone Encounter (Signed)
error 

## 2012-03-09 ENCOUNTER — Encounter (HOSPITAL_COMMUNITY): Payer: Self-pay | Admitting: Pharmacist

## 2012-03-15 ENCOUNTER — Encounter (HOSPITAL_COMMUNITY): Payer: Self-pay

## 2012-03-15 ENCOUNTER — Encounter (HOSPITAL_COMMUNITY)
Admission: RE | Admit: 2012-03-15 | Discharge: 2012-03-15 | Disposition: A | Discharge status: Home / Self Care | Payer: BC Managed Care – PPO | Source: Ambulatory Visit | Attending: Gynecology | Admitting: Gynecology

## 2012-03-15 DIAGNOSIS — Z01818 Encounter for other preprocedural examination: Secondary | ICD-10-CM | POA: Insufficient documentation

## 2012-03-15 DIAGNOSIS — Z01812 Encounter for preprocedural laboratory examination: Secondary | ICD-10-CM | POA: Insufficient documentation

## 2012-03-15 HISTORY — DX: Unspecified asthma, uncomplicated: J45.909

## 2012-03-15 LAB — CBC
HCT: 39.5 % (ref 36.0–46.0)
Hemoglobin: 13.5 g/dL (ref 12.0–15.0)
MCH: 30.1 pg (ref 26.0–34.0)
MCHC: 34.2 g/dL (ref 30.0–36.0)
MCV: 88 fL (ref 78.0–100.0)
Platelets: 262 10*3/uL (ref 150–400)
RBC: 4.49 MIL/uL (ref 3.87–5.11)
RDW: 12.5 % (ref 11.5–15.5)
WBC: 5.8 10*3/uL (ref 4.0–10.5)

## 2012-03-15 LAB — SURGICAL PCR SCREEN
MRSA, PCR: NEGATIVE
Staphylococcus aureus: NEGATIVE

## 2012-03-15 LAB — COMPREHENSIVE METABOLIC PANEL
ALT: 16 U/L (ref 0–35)
AST: 19 U/L (ref 0–37)
Albumin: 4.3 g/dL (ref 3.5–5.2)
Alkaline Phosphatase: 56 U/L (ref 39–117)
BUN: 11 mg/dL (ref 6–23)
CO2: 23 mEq/L (ref 19–32)
Calcium: 9.4 mg/dL (ref 8.4–10.5)
Chloride: 99 mEq/L (ref 96–112)
Creatinine, Ser: 0.62 mg/dL (ref 0.50–1.10)
GFR calc Af Amer: >90 mL/min (ref >90)
GFR calc non Af Amer: >90 mL/min (ref >90)
Glucose, Bld: 94 mg/dL (ref 70–99)
Potassium: 3.5 mEq/L (ref 3.5–5.1)
Sodium: 135 mEq/L (ref 135–145)
Total Bilirubin: 0.8 mg/dL (ref 0.3–1.2)
Total Protein: 7.2 g/dL (ref 6.0–8.3)

## 2012-03-15 NOTE — Patient Instructions (Addendum)
Your procedure is scheduled on:03/22/12  Enter through the Main Entrance at :6am Pick up desk phone and dial [phone] and inform us of your arrival.  Please call [phone] if you have any problems the morning of surgery.  Remember: Do not eat or drink after midnight:Sunday   Take these meds the morning of surgery with a sip of water:BP med DO NOT wear jewelry, eye make-up, lipstick,body lotion, or dark fingernail polish. Do not shave for 48 hours prior to surgery.  If you are to be admitted after surgery, leave suitcase in car until your room has been assigned. Patients discharged on the day of surgery will not be allowed to drive home.

## 2012-03-16 ENCOUNTER — Ambulatory Visit (INDEPENDENT_AMBULATORY_CARE_PROVIDER_SITE_OTHER): Payer: BC Managed Care – PPO | Admitting: Gynecology

## 2012-03-16 ENCOUNTER — Encounter: Payer: Self-pay | Admitting: Gynecology

## 2012-03-16 DIAGNOSIS — N920 Excessive and frequent menstruation with regular cycle: Secondary | ICD-10-CM

## 2012-03-16 DIAGNOSIS — N946 Dysmenorrhea, unspecified: Secondary | ICD-10-CM

## 2012-03-16 NOTE — Progress Notes (Signed)
Brandy [name] Oct 17, 1960 [id number]   Preoperative consultation  Chief complaint:  Frequent, heavy, painful periods   History of present illness: 51 y.o. G3P3 with long history of worsening menses where she will bleed every other to every third week lasting 7-8 days changing every 2 hours double protection bleedthrough episodes with significant cramping which has become socially unacceptable. Has most recently required Megace for menstrual suppression.  Has been evaluated in the past to include a negative sonohysterogram and normal endometrial biopsy and options for management to include hormone manipulation, Mirena IUD, endometrial ablation, hysterectomy. Patient elects for hysterectomy and will be admitted for [facility].  Past medical history,surgical history, medications, allergies, family history and social history were all reviewed and documented in the EPIC chart. ROS:  Was performed and pertinent positives and negatives are included in the history of present illness.  Exam:  Brandy [name] assistant General: well developed, well nourished female, no acute distress HEENT: normal  Lungs: clear to auscultation without wheezing, rales or rhonchi  Cardiac: regular rate without rubs, murmurs or gallops  Abdomen: soft, nontender without masses, guarding, rebound, organomegaly  Pelvic: external bus vagina: normal   Cervix: grossly normal  Uterus: normal size, midline and mobile, nontender  Adnexa: without masses or tenderness      Assessment/Plan:  51 y.o. G3P3 long history of worsening menses as described above. I again reviewed options as I have on multiple occasions in the past several years and the patient ultimately has decided to proceed with hysterectomy. The ovarian conservation issue was reviewed with her and the options to keep both ovaries, recognizing her age for continued hormone production and possible peri-/postmenopausal benefit versus risk of ovarian disease in the future to include pain,  cysts and ovarian cancer versus removing her ovaries now and the issues of symptoms accelerated cardiovascular risk/osteoporosis and possible HRT. The patient very strongly wants to keep both ovaries understanding and accepting the risks of ovarian disease up to and including ovarian cancer in the future although she does give me permission to remove one or both ovaries if significant disease is encountered at the time of surgery and is my best intraoperative decision to remove them she would accept this. Sexuality following hysterectomy reviewed and the risks of persistent orgasmic dysfunction and persistent dyspareunia discussed as well as the absolute and irreversible sterility associated with hysterectomy. The expected intraoperative/postoperative courses were discussed and she understands that we will attempt a TVH but at any time during the procedure if unexpected findings or complications arise I may convert this to an LAVH or TAH with larger incision and longer recovery and she understands and accepts this. The risks of infection to include prolonged antibiotics as well as abscess/hematoma formation requiring reoperation and drainage was reviewed. The risk of hemorrhage necessitating transfusion and the risks of transfusion discussed to include transfusion reaction hepatitis HIV mad cow disease and other unknown entities. Incisional complications requiring opening and draining of incisions and closure by secondary intention as well as long-term issues of keloid/cosmetics and hernia formation was discussed. The risk of inadvertent injury to internal organs including bowel bladder ureters vessels and nerves, either immediately recognized or delay recognized necessitating major exploratory reparative surgeries, future reparative surgeries bowel resection bladder repair ureteral damage repair ostomy formation was all discussed understood and accepted. The patient's questions were answered to her satisfaction she  is ready to proceed with surgery.    Dara Lords MD, 12:43 PM 03/16/2012

## 2012-03-16 NOTE — Patient Instructions (Signed)
Follow up for surgery as scheduled. Call if any questions before hand.

## 2012-03-16 NOTE — H&P (Signed)
Brandy [name] 09-01-1960 [id number]   History and Physical  Chief complaint:  Frequent, heavy, painful periods   History of present illness: 51 y.o. G3P3 with long history of worsening menses where she will bleed every other to every third week lasting 7-8 days changing every 2 hours double protection bleedthrough episodes with significant cramping which has become socially unacceptable. Has most recently required Megace for menstrual suppression.  Has been evaluated in the past to include a negative sonohysterogram and normal endometrial biopsy and options for management to include hormone manipulation, Mirena IUD, endometrial ablation, hysterectomy. Patient elects for hysterectomy and is admitted for [facility].  Past medical history,surgical history, medications, allergies, family history and social history were all reviewed and documented in the EPIC chart. ROS:  Was performed and pertinent positives and negatives are included in the history of present illness.  Exam:  [name] assistant General: well developed, well nourished female, no acute distress HEENT: normal  Lungs: clear to auscultation without wheezing, rales or rhonchi  Cardiac: regular rate without rubs, murmurs or gallops  Abdomen: soft, nontender without masses, guarding, rebound, organomegaly  Pelvic: external bus vagina: normal   Cervix: grossly normal  Uterus: normal size, midline and mobile, nontender  Adnexa: without masses or tenderness      Assessment/Plan:  51 y.o. G3P3 long history of worsening menses as described above. I again reviewed options as I have on multiple occasions in the past several years and the patient ultimately has decided to proceed with hysterectomy. The ovarian conservation issue was reviewed with her and the options to keep both ovaries, recognizing her age for continued hormone production and possible peri-/postmenopausal benefit versus risk of ovarian disease in the future to include pain, cysts and  ovarian cancer versus removing her ovaries now and the issues of symptoms accelerated cardiovascular risk/osteoporosis and possible HRT. The patient very strongly wants to keep both ovaries understanding and accepting the risks of ovarian disease up to and including ovarian cancer in the future although she does give me permission to remove one or both ovaries if significant disease is encountered at the time of surgery and is my best intraoperative decision to remove them she would accept this. Sexuality following hysterectomy reviewed and the risks of persistent orgasmic dysfunction and persistent dyspareunia discussed as well as the absolute and irreversible sterility associated with hysterectomy. The expected intraoperative/postoperative courses were discussed and she understands that we will attempt a TVH but at any time during the procedure if unexpected findings or complications arise I may convert this to an LAVH or TAH with larger incision and longer recovery and she understands and accepts this. The risks of infection to include prolonged antibiotics as well as abscess/hematoma formation requiring reoperation and drainage was reviewed. The risk of hemorrhage necessitating transfusion and the risks of transfusion discussed to include transfusion reaction hepatitis HIV mad cow disease and other unknown entities. Incisional complications requiring opening and draining of incisions and closure by secondary intention as well as long-term issues of keloid/cosmetics and hernia formation was discussed. The risk of inadvertent injury to internal organs including bowel bladder ureters vessels and nerves, either immediately recognized or delay recognized necessitating major exploratory reparative surgeries, future reparative surgeries bowel resection bladder repair ureteral damage repair ostomy formation was all discussed understood and accepted. The patient's questions were answered to her satisfaction she is ready  to proceed with surgery.   Dara Lords MD, 12:50 PM 03/16/2012

## 2012-03-18 ENCOUNTER — Telehealth: Payer: Self-pay | Admitting: Gynecology

## 2012-03-18 NOTE — Telephone Encounter (Signed)
I did get a call back from patient who confirmed that she is very sick and wants to cancel.  Cancelled case with Debbie in the OR. Schedules opened here in the office.

## 2012-03-18 NOTE — Telephone Encounter (Signed)
Let the operating room/Dr. Fernandez/[facility] scheduling know that we are canceling surgery to open our schedules

## 2012-03-18 NOTE — Telephone Encounter (Signed)
Patient's sister called.  Patient is sick with high fever, body aches and they suspect she may have the flu.  She is not able to get ready for surgery on Monday and they are concerned about her trying to have surgery with being sick now.  She said she is going to PCP today.  Sister said Brandy [name] wants to cancel her surgery and Brandy [name] said she will get back with me to let me know what is going on when she is better.  I called patient back and asked her to call me to confirm that this is what she wanted me to do.  I told her I was a little uncomfortable cancelling major surgery based on message left by someone other than the patient and the lady did not leave a phone number.

## 2012-03-22 ENCOUNTER — Encounter (HOSPITAL_COMMUNITY): Admission: RE | Payer: Self-pay | Source: Ambulatory Visit

## 2012-03-22 ENCOUNTER — Ambulatory Visit (HOSPITAL_COMMUNITY): Admission: RE | Admit: 2012-03-22 | Payer: BC Managed Care – PPO | Source: Ambulatory Visit | Admitting: Gynecology

## 2012-03-22 SURGERY — HYSTERECTOMY, VAGINAL
Anesthesia: General

## 2012-06-25 ENCOUNTER — Other Ambulatory Visit: Payer: Self-pay | Admitting: Gynecology

## 2012-06-29 ENCOUNTER — Encounter: Payer: Self-pay | Admitting: Gynecology

## 2012-06-29 ENCOUNTER — Other Ambulatory Visit (HOSPITAL_COMMUNITY)
Admission: RE | Admit: 2012-06-29 | Discharge: 2012-06-29 | Disposition: A | Discharge status: Home / Self Care | Payer: BC Managed Care – PPO | Source: Ambulatory Visit | Attending: Gynecology | Admitting: Gynecology

## 2012-06-29 ENCOUNTER — Ambulatory Visit (INDEPENDENT_AMBULATORY_CARE_PROVIDER_SITE_OTHER): Payer: BC Managed Care – PPO | Admitting: Gynecology

## 2012-06-29 VITALS — BP 130/84 | Ht 64.0 in | Wt 122.0 lb

## 2012-06-29 DIAGNOSIS — Z01419 Encounter for gynecological examination (general) (routine) without abnormal findings: Secondary | ICD-10-CM | POA: Insufficient documentation

## 2012-06-29 DIAGNOSIS — Z1151 Encounter for screening for human papillomavirus (HPV): Secondary | ICD-10-CM | POA: Insufficient documentation

## 2012-06-29 DIAGNOSIS — N920 Excessive and frequent menstruation with regular cycle: Secondary | ICD-10-CM

## 2012-06-29 DIAGNOSIS — Z13220 Encounter for screening for lipoid disorders: Secondary | ICD-10-CM

## 2012-06-29 LAB — LIPID PANEL
Cholesterol: 183 mg/dL (ref 0–200)
HDL: 53 mg/dL (ref >39)
LDL Cholesterol: 110 mg/dL — ABNORMAL HIGH (ref 0–99)
Total CHOL/HDL Ratio: 3.5 Ratio
Triglycerides: 98 mg/dL (ref <150)
VLDL: 20 mg/dL (ref 0–40)

## 2012-06-29 LAB — CBC WITH DIFFERENTIAL/PLATELET
Basophils Absolute: 0 10*3/uL (ref 0.0–0.1)
Basophils Relative: 0 % (ref 0–1)
Eosinophils Absolute: 0.1 10*3/uL (ref 0.0–0.7)
Eosinophils Relative: 1 % (ref 0–5)
HCT: 38.3 % (ref 36.0–46.0)
Hemoglobin: 12.9 g/dL (ref 12.0–15.0)
Lymphocytes Relative: 27 % (ref 12–46)
Lymphs Abs: 1.7 10*3/uL (ref 0.7–4.0)
MCH: 30.4 pg (ref 26.0–34.0)
MCHC: 33.7 g/dL (ref 30.0–36.0)
MCV: 90.3 fL (ref 78.0–100.0)
Monocytes Absolute: 0.4 10*3/uL (ref 0.1–1.0)
Monocytes Relative: 6 % (ref 3–12)
Neutro Abs: 4.2 10*3/uL (ref 1.7–7.7)
Neutrophils Relative %: 66 % (ref 43–77)
Platelets: 272 10*3/uL (ref 150–400)
RBC: 4.24 MIL/uL (ref 3.87–5.11)
RDW: 13.3 % (ref 11.5–15.5)
WBC: 6.3 10*3/uL (ref 4.0–10.5)

## 2012-06-29 LAB — COMPREHENSIVE METABOLIC PANEL
ALT: 25 U/L (ref 0–35)
AST: 21 U/L (ref 0–37)
Albumin: 4.4 g/dL (ref 3.5–5.2)
Alkaline Phosphatase: 51 U/L (ref 39–117)
BUN: 11 mg/dL (ref 6–23)
CO2: 28 mEq/L (ref 19–32)
Calcium: 9 mg/dL (ref 8.4–10.5)
Chloride: 102 mEq/L (ref 96–112)
Creat: 0.61 mg/dL (ref 0.50–1.10)
Glucose, Bld: 89 mg/dL (ref 70–99)
Potassium: 3.7 mEq/L (ref 3.5–5.3)
Sodium: 139 mEq/L (ref 135–145)
Total Bilirubin: 0.4 mg/dL (ref 0.3–1.2)
Total Protein: 6.4 g/dL (ref 6.0–8.3)

## 2012-06-29 NOTE — Patient Instructions (Signed)
All for ultrasound as scheduled. Office will call you to arrange for mammogram.

## 2012-06-29 NOTE — Progress Notes (Signed)
Brandy [name] 11/17/1960 [id number]        52 y.o.  G3P3003 for annual exam.  Several issues noted below.  Past medical history,surgical history, medications, allergies, family history and social history were all reviewed and documented in the EPIC chart. ROS:  Was performed and pertinent positives and negatives are included in the history.  Exam: [name] assistant Filed Vitals:   06/29/12 1509  BP: 130/84  Height: 5\' 4"  (1.626 m)  Weight: 122 lb (55.339 kg)   General appearance  Normal Skin grossly normal Head/Neck normal with no cervical or supraclavicular adenopathy thyroid normal Lungs  clear Cardiac RR, without RMG Abdominal  soft, nontender, without masses, organomegaly or hernia Breasts  examined lying and sitting without masses, retractions, discharge or axillary adenopathy. Pelvic  Ext/BUS/vagina  normal   Cervix  normal   Uterus  anteverted, normal size, shape and contour, midline and mobile nontender   Adnexa  Without masses or tenderness    Anus and perineum  normal   Rectovaginal  normal sphincter tone without palpated masses or tenderness.    Assessment/Plan:  52 y.o. G28P3003 female for annual exam.   1. Menorrhagia.  Patient's menses continue long and heavy.  Was scheduled for hysterectomy in November but she canceled this. Had not been studied in several years and we postponed it because of proceeding with a hysterectomy. It is now she is not moving towards hysterectomy then we will proceed with sonohysterogram to reassess the uterus and cavity allow sampling of the endometrium. Her menses continue heavy lasting up to 2 weeks. Also check baseline CBC and TSH. 2. Breast nodularity.  Patient was unsure whether she felt something in her left breast in the tail of Spence region. Her exam is totally normal today. We'll plan diagnostic mammogram as she does have dense breasts bilaterally. Assuming normal them we'll follow. 3. Questionable vulvar pigmented areas.  In several  areas on her right upper labia majora that she wanted me to look at. 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 Vulvar exam is totally normal in the area she is pointing to. Both labia majora are mildly pigmented in the areas she is pointing to look like normal pigmented areas. Patient will continue to follow as long as a remain unchanged. The changes also represent for evaluation. 4. Pap smear 2011.  Pap/HPV done today. 5. Colonoscopy.  Recommend baseline colonoscopy as she turned 52 and she agrees to arrange this. 6. Health maintenance.  Baseline CBC comprehensive metabolic panel urinalysis vitamin D lipid profile and TSH ordered along with the sonohysterogram. She is being followed for hypertension and hypercholesterolemia and has not had blood drawn in some time.    Dara Lords MD, 3:44 PM 06/29/2012

## 2012-06-30 ENCOUNTER — Telehealth: Payer: Self-pay | Admitting: *Deleted

## 2012-06-30 DIAGNOSIS — R922 Inconclusive mammogram: Secondary | ICD-10-CM

## 2012-06-30 LAB — URINALYSIS W MICROSCOPIC + REFLEX CULTURE
Bacteria, UA: NONE SEEN
Bilirubin Urine: NEGATIVE
Casts: NONE SEEN
Crystals: NONE SEEN
Glucose, UA: NEGATIVE mg/dL
Hgb urine dipstick: NEGATIVE
Ketones, ur: NEGATIVE mg/dL
Leukocytes, UA: NEGATIVE
Nitrite: NEGATIVE
Protein, ur: NEGATIVE mg/dL
Specific Gravity, Urine: 1.009 (ref 1.005–1.030)
Squamous Epithelial / LPF: NONE SEEN
Urobilinogen, UA: 1 mg/dL (ref 0.0–1.0)
pH: 6 (ref 5.0–8.0)

## 2012-06-30 LAB — TSH: TSH: 2.451 u[IU]/mL (ref 0.350–4.500)

## 2012-06-30 LAB — VITAMIN D 25 HYDROXY (VIT D DEFICIENCY, FRACTURES): Vit D, 25-Hydroxy: 21 ng/mL — ABNORMAL LOW (ref 30–89)

## 2012-06-30 NOTE — Telephone Encounter (Signed)
Message copied by Aura Camps on Wed Jun 30, 2012  9:20 AM ------      Message from: Dara Lords      Created: Tue Jun 29, 2012  4:11 PM       [facility] reference tender left tail of Spence. Very dense breasts. ------

## 2012-07-05 NOTE — Telephone Encounter (Signed)
Order placed for the below 

## 2012-07-13 NOTE — Telephone Encounter (Signed)
Appt. 07/22/12 @ 10:50 a.m

## 2012-07-16 ENCOUNTER — Ambulatory Visit (INDEPENDENT_AMBULATORY_CARE_PROVIDER_SITE_OTHER): Payer: BC Managed Care – PPO | Admitting: Gynecology

## 2012-07-16 ENCOUNTER — Encounter: Payer: Self-pay | Admitting: Gynecology

## 2012-07-16 ENCOUNTER — Ambulatory Visit (INDEPENDENT_AMBULATORY_CARE_PROVIDER_SITE_OTHER): Payer: BC Managed Care – PPO

## 2012-07-16 DIAGNOSIS — D259 Leiomyoma of uterus, unspecified: Secondary | ICD-10-CM

## 2012-07-16 DIAGNOSIS — N920 Excessive and frequent menstruation with regular cycle: Secondary | ICD-10-CM

## 2012-07-16 NOTE — Progress Notes (Signed)
Patient presents for sonohysterogram due to persistent menorrhagia. This is been a long-standing issue.  Ultrasound shows uterus generous in size. 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 Right ovary normal. 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 No free fluid. Sonohysterogram performed, sterile technique, easy catheter introduction, good distention with no abnormalities.  Endometrial sample taken. Patient tolerated well.  Assessment and plan: Long-standing menorrhagia. Patient will follow up for biopsy results. I again discussed options to include hormonal manipulation, Mirena IUD, endometrial ablation, hysterectomy. Patient will followup for biopsy results and with her decision.

## 2012-07-16 NOTE — Patient Instructions (Signed)
Followup with decision about menses management

## 2012-08-11 ENCOUNTER — Telehealth: Payer: Self-pay | Admitting: *Deleted

## 2012-08-11 MED ORDER — MEGESTROL ACETATE 20 MG PO TABS: 20.0000 mg | ORAL_TABLET | Freq: Two times a day (BID) | ORAL | Status: DC

## 2012-08-11 NOTE — Telephone Encounter (Signed)
Pt informed with the below note. She will think about the hysterectomy.

## 2012-08-11 NOTE — Telephone Encounter (Signed)
Pt calling c/o bad cramping and passing clots, she feels terrible. Period started on Saturday, she left earlier from work due to discomfort. Pt is calling requesting medication to stop her bleeding. Please advise

## 2012-08-11 NOTE — Telephone Encounter (Signed)
1. Megace 20 mg twice a day x5 days 2. Patient really needs to make a decision about hysterectomy that we've had discussion about multiple times

## 2012-11-05 ENCOUNTER — Telehealth: Payer: Self-pay | Admitting: *Deleted

## 2012-11-05 ENCOUNTER — Telehealth: Payer: Self-pay

## 2012-11-05 MED ORDER — MEGESTROL ACETATE 20 MG PO TABS: 20.0000 mg | ORAL_TABLET | Freq: Two times a day (BID) | ORAL | Status: DC

## 2012-11-05 NOTE — Telephone Encounter (Signed)
Patient called in voice mail stating she is ready to talk about scheduling her Hysterectomy.  Can you let me know exact procedure details,  etc. Thanks!

## 2012-11-05 NOTE — Telephone Encounter (Signed)
We will plan on TVH possible LAVH BSO

## 2012-11-05 NOTE — Telephone Encounter (Signed)
Pt is calling requesting medication to stop her bleeding, cycle has been on now for 2 weeks. I asked if she has made decision about the hysterectomy, pt left message for [facility] to contact her regarding this. Please advise

## 2012-11-05 NOTE — Telephone Encounter (Signed)
Pt informed with the below note, Rx sent. 

## 2012-11-05 NOTE — Telephone Encounter (Signed)
Megace 20 mg daily x7 days. Patient really needs to make a decision about surgery.

## 2012-12-22 ENCOUNTER — Ambulatory Visit
Admission: RE | Admit: 2012-12-22 | Discharge: 2012-12-22 | Disposition: A | Discharge status: Home / Self Care | Payer: BC Managed Care – PPO | Source: Ambulatory Visit | Attending: Gynecology | Admitting: Gynecology

## 2012-12-22 ENCOUNTER — Other Ambulatory Visit: Payer: Self-pay | Admitting: Gynecology

## 2012-12-22 DIAGNOSIS — N6320 Unspecified lump in the left breast, unspecified quadrant: Secondary | ICD-10-CM

## 2012-12-22 DIAGNOSIS — R922 Inconclusive mammogram: Secondary | ICD-10-CM

## 2013-03-03 ENCOUNTER — Ambulatory Visit (INDEPENDENT_AMBULATORY_CARE_PROVIDER_SITE_OTHER): Payer: BC Managed Care – PPO | Admitting: Gynecology

## 2013-03-03 ENCOUNTER — Encounter: Payer: Self-pay | Admitting: Gynecology

## 2013-03-03 DIAGNOSIS — R5383 Other fatigue: Secondary | ICD-10-CM

## 2013-03-03 DIAGNOSIS — Z23 Encounter for immunization: Secondary | ICD-10-CM

## 2013-03-03 DIAGNOSIS — R5381 Other malaise: Secondary | ICD-10-CM

## 2013-03-03 DIAGNOSIS — E559 Vitamin D deficiency, unspecified: Secondary | ICD-10-CM

## 2013-03-03 DIAGNOSIS — N920 Excessive and frequent menstruation with regular cycle: Secondary | ICD-10-CM

## 2013-03-03 LAB — CBC WITH DIFFERENTIAL/PLATELET
Basophils Absolute: 0 10*3/uL (ref 0.0–0.1)
Basophils Relative: 0 % (ref 0–1)
Eosinophils Absolute: 0.1 10*3/uL (ref 0.0–0.7)
Eosinophils Relative: 1 % (ref 0–5)
HCT: 37.5 % (ref 36.0–46.0)
Hemoglobin: 12.8 g/dL (ref 12.0–15.0)
Lymphocytes Relative: 21 % (ref 12–46)
Lymphs Abs: 1.3 10*3/uL (ref 0.7–4.0)
MCH: 31.4 pg (ref 26.0–34.0)
MCHC: 34.1 g/dL (ref 30.0–36.0)
MCV: 92.1 fL (ref 78.0–100.0)
Monocytes Absolute: 0.3 10*3/uL (ref 0.1–1.0)
Monocytes Relative: 5 % (ref 3–12)
Neutro Abs: 4.5 10*3/uL (ref 1.7–7.7)
Neutrophils Relative %: 73 % (ref 43–77)
Platelets: 280 10*3/uL (ref 150–400)
RBC: 4.07 MIL/uL (ref 3.87–5.11)
RDW: 12.7 % (ref 11.5–15.5)
WBC: 6.3 10*3/uL (ref 4.0–10.5)

## 2013-03-03 NOTE — Addendum Note (Signed)
Addended by: Dayna Barker on: 03/03/2013 10:58 AM   Modules accepted: Orders

## 2013-03-03 NOTE — Progress Notes (Signed)
Patient presents complaining of continued heavy menses. We've discussed this on multiple occasions and she actually had a hysterectomy plan last year but canceled. Had sonohysterogram 06/2012 which showed several small myomas but otherwise normal. Biopsy was benign endometrium. Patient feeling very fatigued.  Exam with Berenice Bouton External BUS vagina normal. Cervix grossly normal. Uterus anteverted grossly normal in size and shape. Adnexa without masses or tenderness.  Assessment and plan: 1. Menorrhagia with negative sonohysterogram and endometrial biopsy. TSH earlier this year normal. Hemoglobin was stable at 12. Recheck CBC now and at this age. I again reviewed options for management include hysterectomy/Mirena IUD/endometrial ablation. She does have migraines with aura do not think oral contraceptives the best choice at this point. The pros/cons risks/benefits of each choice discussed. Using no consistent contraception and risks of pregnancy reviewed. I think a Mirena IUD would be a perfect choice given the circumstance and encouraged her to consider this. Alternatives with hysterectomy again reviewed with her. Patient will follow up her lab results we'll go from there. 2. History of vitamin D deficiency. Vitamin D was 21 when I saw her in February 2014. She states she is on extra vitamin D and we'll go ahead and recheck her level today.

## 2013-03-03 NOTE — Patient Instructions (Signed)
Followup for lab results and ultimate decision about what you want to do about your heavy bleeding.

## 2013-03-04 ENCOUNTER — Other Ambulatory Visit: Payer: Self-pay | Admitting: Gynecology

## 2013-03-04 DIAGNOSIS — E559 Vitamin D deficiency, unspecified: Secondary | ICD-10-CM

## 2013-03-04 LAB — FOLLICLE STIMULATING HORMONE: FSH: 7 m[IU]/mL

## 2013-03-04 LAB — VITAMIN D 25 HYDROXY (VIT D DEFICIENCY, FRACTURES): Vit D, 25-Hydroxy: 23 ng/mL — ABNORMAL LOW (ref 30–89)

## 2013-03-04 MED ORDER — VITAMIN D (ERGOCALCIFEROL) 1.25 MG (50000 UNIT) PO CAPS: 50000.0000 [IU] | ORAL_CAPSULE | ORAL | Status: DC

## 2013-04-04 ENCOUNTER — Telehealth: Payer: Self-pay | Admitting: *Deleted

## 2013-04-04 NOTE — Telephone Encounter (Signed)
I would recommend patient consider Mirena IUD as we discussed. We've had this discussion on multiple occasions about dealing with her heavy menses. The patient has failed to followup on any of the suggestions and as such she probably will continue to bleed unless she chooses one of the options we discussed. If she chooses Mirena she should come and have it placed. She will have to abstain from intercourse for 2 weeks and had a negative hCG.

## 2013-04-04 NOTE — Telephone Encounter (Signed)
Pt called c/o bleeding since OV 03/03/13  Heavy to light at times, wearing pads and changing pads every 2 hours with cramping. Pt said she read if you take ibuprofen every 4 hours it helps with bleeding, but this has not helped as well. Pt tried megace in past and Rx didn't help as well. Please advise

## 2013-04-04 NOTE — Telephone Encounter (Signed)
Pt informed with the below note. 

## 2013-04-04 NOTE — Telephone Encounter (Signed)
Left message for pt to call.

## 2013-05-19 DIAGNOSIS — C50919 Malignant neoplasm of unspecified site of unspecified female breast: Secondary | ICD-10-CM

## 2013-05-19 HISTORY — DX: Malignant neoplasm of unspecified site of unspecified female breast: C50.919

## 2013-05-19 HISTORY — PX: BILATERAL TOTAL MASTECTOMY WITH AXILLARY LYMPH NODE DISSECTION: SHX6364

## 2013-07-27 ENCOUNTER — Encounter: Payer: Self-pay | Admitting: Gynecology

## 2013-07-27 ENCOUNTER — Ambulatory Visit (INDEPENDENT_AMBULATORY_CARE_PROVIDER_SITE_OTHER): Payer: BC Managed Care – PPO | Admitting: Gynecology

## 2013-07-27 VITALS — BP 112/66 | Ht 64.5 in | Wt 112.0 lb

## 2013-07-27 DIAGNOSIS — Z01419 Encounter for gynecological examination (general) (routine) without abnormal findings: Secondary | ICD-10-CM

## 2013-07-27 DIAGNOSIS — E559 Vitamin D deficiency, unspecified: Secondary | ICD-10-CM

## 2013-07-27 LAB — CBC WITH DIFFERENTIAL/PLATELET
Basophils Absolute: 0.1 10*3/uL (ref 0.0–0.1)
Basophils Relative: 1 % (ref 0–1)
Eosinophils Absolute: 0.1 10*3/uL (ref 0.0–0.7)
Eosinophils Relative: 1 % (ref 0–5)
HCT: 38 % (ref 36.0–46.0)
Hemoglobin: 12.7 g/dL (ref 12.0–15.0)
Lymphocytes Relative: 29 % (ref 12–46)
Lymphs Abs: 1.5 10*3/uL (ref 0.7–4.0)
MCH: 29.7 pg (ref 26.0–34.0)
MCHC: 33.4 g/dL (ref 30.0–36.0)
MCV: 88.8 fL (ref 78.0–100.0)
Monocytes Absolute: 0.4 10*3/uL (ref 0.1–1.0)
Monocytes Relative: 7 % (ref 3–12)
Neutro Abs: 3.2 10*3/uL (ref 1.7–7.7)
Neutrophils Relative %: 62 % (ref 43–77)
Platelets: 271 10*3/uL (ref 150–400)
RBC: 4.28 MIL/uL (ref 3.87–5.11)
RDW: 14 % (ref 11.5–15.5)
WBC: 5.2 10*3/uL (ref 4.0–10.5)

## 2013-07-27 NOTE — Progress Notes (Signed)
Brandy [name] 04-13-1961 [id number]        53 y.o.  G3P3003 for annual exam.  Several issues noted below.  Past medical history,surgical history, problem list, medications, allergies, family history and social history were all reviewed and documented in the EPIC chart.  ROS:  Performed and pertinent positives and negatives are included in the history, assessment and plan .  Exam: [name] assistant Filed Vitals:   07/27/13 1547  BP: 112/66  Height: 5' 4.5" (1.638 m)  Weight: 112 lb (50.803 kg)   General appearance  Normal Skin grossly normal Head/Neck normal with no cervical or supraclavicular adenopathy thyroid normal Lungs  clear Cardiac RR, without RMG Abdominal  soft, nontender, without masses, organomegaly or hernia Breasts  examined lying and sitting without masses, retractions, discharge or axillary adenopathy. Pelvic  Ext/BUS/vagina normal  Cervix normal  Uterus generous in size, anteverted, midline and mobile nontender   Adnexa  Without masses or tenderness    Anus and perineum  Normal   Rectovaginal  Normal sphincter tone without palpated masses or tenderness.    Assessment/Plan:  53 y.o. G57P3003 female for annual exam.   1. Irregular menses/menorrhagia. Patient with long history of menorrhagia. Uterus generous in size. Ultrasound a year ago showed several small myomas. Endometrial biopsy negative. Had been working towards hysterectomy. LMP 05/19/2013. Without significant menopausal symptoms. Check FSH today. At this point we both agreed just to monitor given her age and probable entering menopause and otherwise asymptomatic. Keep menstrual calendar. As long as less frequent but regular menses Will follow. If prolonged or atypical bleeding will call. If [place] is normal discussed progesterone withdrawal. Patient will followup for lab results. 2. History of VIN 2006. Vulvar exam is normal today. Patient will report any self perceived abnormalities. 3. Pap smear/HPV 2014. No Pap  smear done today. No history of abnormal Pap smears previously. Plan repeat at 3-5 year interval. 4. Colonoscopy scheduled this coming April. 5. Bone density never. We'll plan further into menopause. Increase calcium vitamin D reviewed. 6. Health maintenance. Baseline CBC comprehensive metabolic panel FSH TSH lipid profile urinalysis ordered.   Note: This document was prepared with digital dictation and possible smart phrase technology. Any transcriptional errors that result from this process are unintentional.   [name] MD, 4:45 PM 07/27/2013

## 2013-07-27 NOTE — Patient Instructions (Signed)
Office will contact you with lab results. Keep track of your menstrual periods. Report any prolonged or irregular bleeding. Followup in one year for annual exam.  Health Maintenance, Female A healthy lifestyle and preventative care can promote health and wellness.  Maintain regular health, dental, and eye exams.  Eat a healthy diet. Foods like vegetables, fruits, whole grains, low-fat dairy products, and lean protein foods contain the nutrients you need without too many calories. Decrease your intake of foods high in solid fats, added sugars, and salt. Get information about a proper diet from your caregiver, if necessary.  Regular physical exercise is one of the most important things you can do for your health. Most adults should get at least 150 minutes of moderate-intensity exercise (any activity that increases your heart rate and causes you to sweat) each week. In addition, most adults need muscle-strengthening exercises on 2 or more days a week.   Maintain a healthy weight. The body mass index (BMI) is a screening tool to identify possible weight problems. It provides an estimate of body fat based on height and weight. Your caregiver can help determine your BMI, and can help you achieve or maintain a healthy weight. For adults 20 years and older:  A BMI below 18.5 is considered underweight.  A BMI of 18.5 to 24.9 is normal.  A BMI of 25 to 29.9 is considered overweight.  A BMI of 30 and above is considered obese.  Maintain normal blood lipids and cholesterol by exercising and minimizing your intake of saturated fat. Eat a balanced diet with plenty of fruits and vegetables. Blood tests for lipids and cholesterol should begin at age 66 and be repeated every 5 years. If your lipid or cholesterol levels are high, you are over 50, or you are a high risk for heart disease, you may need your cholesterol levels checked more frequently.Ongoing high lipid and cholesterol levels should be treated  with medicines if diet and exercise are not effective.  If you smoke, find out from your caregiver how to quit. If you do not use tobacco, do not start.  Lung cancer screening is recommended for adults aged 3 80 years who are at high risk for developing lung cancer because of a history of smoking. Yearly low-dose computed tomography (CT) is recommended for people who have at least a 30-pack-year history of smoking and are a current smoker or have quit within the past 15 years. A pack year of smoking is smoking an average of 1 pack of cigarettes a day for 1 year (for example: 1 pack a day for 30 years or 2 packs a day for 15 years). Yearly screening should continue until the smoker has stopped smoking for at least 15 years. Yearly screening should also be stopped for people who develop a health problem that would prevent them from having lung cancer treatment.  If you are pregnant, do not drink alcohol. If you are breastfeeding, be very cautious about drinking alcohol. If you are not pregnant and choose to drink alcohol, do not exceed 1 drink per day. One drink is considered to be 12 ounces (355 mL) of beer, 5 ounces (148 mL) of wine, or 1.5 ounces (44 mL) of liquor.  Avoid use of street drugs. Do not share needles with anyone. Ask for help if you need support or instructions about stopping the use of drugs.  High blood pressure causes heart disease and increases the risk of stroke. Blood pressure should be checked at least every  1 to 2 years. Ongoing high blood pressure should be treated with medicines, if weight loss and exercise are not effective.  If you are 73 to 52 years old, ask your caregiver if you should take aspirin to prevent strokes.  Diabetes screening involves taking a blood sample to check your fasting blood sugar level. This should be done once every 3 years, after age 25, if you are within normal weight and without risk factors for diabetes. Testing should be considered at a younger  age or be carried out more frequently if you are overweight and have at least 1 risk factor for diabetes.  Breast cancer screening is essential preventative care for women. You should practice "breast self-awareness." This means understanding the normal appearance and feel of your breasts and may include breast self-examination. Any changes detected, no matter how small, should be reported to a caregiver. Women in their 3s and 30s should have a clinical breast exam (CBE) by a caregiver as part of a regular health exam every 1 to 3 years. After age 88, women should have a CBE every year. Starting at age 34, women should consider having a mammogram (breast X-ray) every year. Women who have a family history of breast cancer should talk to their caregiver about genetic screening. Women at a high risk of breast cancer should talk to their caregiver about having an MRI and a mammogram every year.  Breast cancer gene (BRCA)-related cancer risk assessment is recommended for women who have family members with BRCA-related cancers. BRCA-related cancers include breast, ovarian, tubal, and peritoneal cancers. Having family members with these cancers may be associated with an increased risk for harmful changes (mutations) in the breast cancer genes BRCA1 and BRCA2. Results of the assessment will determine the need for genetic counseling and BRCA1 and BRCA2 testing.  The Pap test is a screening test for cervical cancer. Women should have a Pap test starting at age 22. Between ages 79 and 89, Pap tests should be repeated every 2 years. Beginning at age 19, you should have a Pap test every 3 years as long as the past 3 Pap tests have been normal. If you had a hysterectomy for a problem that was not cancer or a condition that could lead to cancer, then you no longer need Pap tests. If you are between ages 84 and 82, and you have had normal Pap tests going back 10 years, you no longer need Pap tests. If you have had past  treatment for cervical cancer or a condition that could lead to cancer, you need Pap tests and screening for cancer for at least 20 years after your treatment. If Pap tests have been discontinued, risk factors (such as a new sexual partner) need to be reassessed to determine if screening should be resumed. Some women have medical problems that increase the chance of getting cervical cancer. In these cases, your caregiver may recommend more frequent screening and Pap tests.  The human papillomavirus (HPV) test is an additional test that may be used for cervical cancer screening. The HPV test looks for the virus that can cause the cell changes on the cervix. The cells collected during the Pap test can be tested for HPV. The HPV test could be used to screen women aged 15 years and older, and should be used in women of any age who have unclear Pap test results. After the age of 59, women should have HPV testing at the same frequency as a Pap test.  Colorectal cancer can be detected and often prevented. Most routine colorectal cancer screening begins at the age of 54 and continues through age 72. However, your caregiver may recommend screening at an earlier age if you have risk factors for colon cancer. On a yearly basis, your caregiver may provide home test kits to check for hidden blood in the stool. Use of a small camera at the end of a tube, to directly examine the colon (sigmoidoscopy or colonoscopy), can detect the earliest forms of colorectal cancer. Talk to your caregiver about this at age 76, when routine screening begins. Direct examination of the colon should be repeated every 5 to 10 years through age 24, unless early forms of pre-cancerous polyps or small growths are found.  Hepatitis C blood testing is recommended for all people born from [birth year] through 1964 and any individual with known risks for hepatitis C.  Practice safe sex. Use condoms and avoid high-risk sexual practices to reduce the spread of  sexually transmitted infections (STIs). Sexually active women aged 18 and younger should be checked for Chlamydia, which is a common sexually transmitted infection. Older women with new or multiple partners should also be tested for Chlamydia. Testing for other STIs is recommended if you are sexually active and at increased risk.  Osteoporosis is a disease in which the bones lose minerals and strength with aging. This can result in serious bone fractures. The risk of osteoporosis can be identified using a bone density scan. Women ages 60 and over and women at risk for fractures or osteoporosis should discuss screening with their caregivers. Ask your caregiver whether you should be taking a calcium supplement or vitamin D to reduce the rate of osteoporosis.  Menopause can be associated with physical symptoms and risks. Hormone replacement therapy is available to decrease symptoms and risks. You should talk to your caregiver about whether hormone replacement therapy is right for you.  Use sunscreen. Apply sunscreen liberally and repeatedly throughout the day. You should seek shade when your shadow is shorter than you. Protect yourself by wearing long sleeves, pants, a wide-brimmed hat, and sunglasses year round, whenever you are outdoors.  Notify your caregiver of new moles or changes in moles, especially if there is a change in shape or color. Also notify your caregiver if a mole is larger than the size of a pencil eraser.  Stay current with your immunizations. Document Released: 11/18/2010 Document Revised: 08/30/2012 Document Reviewed: 11/18/2010 [facility] Patient Information 2014 [place].

## 2013-07-28 ENCOUNTER — Other Ambulatory Visit: Payer: Self-pay | Admitting: Gynecology

## 2013-07-28 DIAGNOSIS — R7989 Other specified abnormal findings of blood chemistry: Secondary | ICD-10-CM

## 2013-07-28 LAB — COMPREHENSIVE METABOLIC PANEL
ALT: 22 U/L (ref 0–35)
AST: 20 U/L (ref 0–37)
Albumin: 4.1 g/dL (ref 3.5–5.2)
Alkaline Phosphatase: 50 U/L (ref 39–117)
BUN: 12 mg/dL (ref 6–23)
CO2: 26 mEq/L (ref 19–32)
Calcium: 9.5 mg/dL (ref 8.4–10.5)
Chloride: 106 mEq/L (ref 96–112)
Creat: 0.52 mg/dL (ref 0.50–1.10)
Glucose, Bld: 79 mg/dL (ref 70–99)
Potassium: 3.6 mEq/L (ref 3.5–5.3)
Sodium: 140 mEq/L (ref 135–145)
Total Bilirubin: 0.4 mg/dL (ref 0.2–1.2)
Total Protein: 6.4 g/dL (ref 6.0–8.3)

## 2013-07-28 LAB — LIPID PANEL
Cholesterol: 163 mg/dL (ref 0–200)
HDL: 64 mg/dL (ref >39)
LDL Cholesterol: 86 mg/dL (ref 0–99)
Total CHOL/HDL Ratio: 2.5 Ratio
Triglycerides: 65 mg/dL (ref <150)
VLDL: 13 mg/dL (ref 0–40)

## 2013-07-28 LAB — URINALYSIS W MICROSCOPIC + REFLEX CULTURE
Bacteria, UA: NONE SEEN
Bilirubin Urine: NEGATIVE
Casts: NONE SEEN
Crystals: NONE SEEN
Glucose, UA: NEGATIVE mg/dL
Hgb urine dipstick: NEGATIVE
Ketones, ur: NEGATIVE mg/dL
Leukocytes, UA: NEGATIVE
Nitrite: NEGATIVE
Protein, ur: NEGATIVE mg/dL
Specific Gravity, Urine: 1.009 (ref 1.005–1.030)
Squamous Epithelial / LPF: NONE SEEN
Urobilinogen, UA: 0.2 mg/dL (ref 0.0–1.0)
pH: 6 (ref 5.0–8.0)

## 2013-07-28 LAB — VITAMIN D 25 HYDROXY (VIT D DEFICIENCY, FRACTURES): Vit D, 25-Hydroxy: 49 ng/mL (ref 30–89)

## 2013-07-28 LAB — TSH: TSH: 0.041 u[IU]/mL — ABNORMAL LOW (ref 0.350–4.500)

## 2013-07-28 LAB — FOLLICLE STIMULATING HORMONE: FSH: 34.8 m[IU]/mL

## 2013-07-29 ENCOUNTER — Telehealth: Payer: Self-pay | Admitting: *Deleted

## 2013-07-29 ENCOUNTER — Other Ambulatory Visit: Payer: BC Managed Care – PPO

## 2013-07-29 NOTE — Telephone Encounter (Signed)
I would finish whatever she has at home and then start on 2000 units OTC daily

## 2013-07-29 NOTE — Telephone Encounter (Signed)
Pt would like to know if she should continuing taking prescribed Vit. 50,000 units? Recent Vit. D is 49.

## 2013-07-29 NOTE — Telephone Encounter (Signed)
Pt informed with the below note. 

## 2013-07-29 NOTE — Telephone Encounter (Signed)
Left on pt voicemail that 07/27/13 results for cmet and cbc and lipid profile normal.

## 2013-08-02 ENCOUNTER — Other Ambulatory Visit: Payer: BC Managed Care – PPO

## 2013-08-02 DIAGNOSIS — R7989 Other specified abnormal findings of blood chemistry: Secondary | ICD-10-CM

## 2013-08-03 ENCOUNTER — Telehealth: Payer: Self-pay | Admitting: *Deleted

## 2013-08-03 DIAGNOSIS — E0590 Thyrotoxicosis, unspecified without thyrotoxic crisis or storm: Secondary | ICD-10-CM

## 2013-08-03 LAB — THYROID PANEL WITH TSH
Free Thyroxine Index: 3.5 (ref 1.0–3.9)
T3 Uptake: 33.4 % (ref 22.5–37.0)
T4, Total: 10.4 ug/dL (ref 5.0–12.5)
TSH: 0.047 u[IU]/mL — ABNORMAL LOW (ref 0.350–4.500)

## 2013-08-03 NOTE — Telephone Encounter (Signed)
Message copied by Thamas Jaegers on Wed Aug 03, 2013 11:21 AM ------      Message from: Ramond Craver      Created: Wed Aug 03, 2013 11:11 AM      Regarding: endocrinology referral       Per Dr. Loetta Rough, "Tell patient that her TSH is still a little bit low which would indicate a hyper thyroid state. Recommend appointment with [facility] endocrinology."            Patient informed. She will expect to hear about appt in a few days.            Thanks! ------

## 2013-08-03 NOTE — Telephone Encounter (Signed)
Appointment 08/18/13 @ 1:15 pm

## 2013-08-03 NOTE — Telephone Encounter (Signed)
Referral placed, they will contact patient to schedule. 

## 2013-08-11 ENCOUNTER — Other Ambulatory Visit: Payer: Self-pay | Admitting: Obstetrics and Gynecology

## 2013-08-11 DIAGNOSIS — N63 Unspecified lump in unspecified breast: Secondary | ICD-10-CM

## 2013-08-16 ENCOUNTER — Ambulatory Visit: Payer: BC Managed Care – PPO | Admitting: Gynecology

## 2013-08-16 ENCOUNTER — Telehealth: Payer: Self-pay | Admitting: *Deleted

## 2013-08-16 NOTE — Telephone Encounter (Signed)
Error

## 2013-08-18 ENCOUNTER — Ambulatory Visit: Payer: BC Managed Care – PPO | Admitting: Internal Medicine

## 2013-08-18 DIAGNOSIS — Z0289 Encounter for other administrative examinations: Secondary | ICD-10-CM

## 2013-08-19 ENCOUNTER — Ambulatory Visit
Admission: RE | Admit: 2013-08-19 | Discharge: 2013-08-19 | Disposition: A | Discharge status: Home / Self Care | Payer: BC Managed Care – PPO | Source: Ambulatory Visit | Attending: Obstetrics and Gynecology | Admitting: Obstetrics and Gynecology

## 2013-08-19 DIAGNOSIS — N63 Unspecified lump in unspecified breast: Secondary | ICD-10-CM

## 2013-09-06 ENCOUNTER — Telehealth: Payer: Self-pay | Admitting: *Deleted

## 2013-09-06 NOTE — Telephone Encounter (Signed)
Pt called c/o left side ovary discomfort x 1 week now, pt asked is she could still be ovulating? LMP:05/19/2013 had some spotting after last OV on 07/27/13, but no bleeding since then. Pt said feels like a throbbing in this area. I told patient OV best to exam, she would like to know if ultrasound should be scheduled as well?

## 2013-09-06 NOTE — Telephone Encounter (Signed)
She certainly can still be ovulating. If her discomfort persists into next week then recommend schedule ultrasound with office visit.

## 2013-09-06 NOTE — Telephone Encounter (Signed)
Left the below on voicemail  

## 2013-09-13 ENCOUNTER — Other Ambulatory Visit: Payer: Self-pay | Admitting: Gynecology

## 2013-09-13 ENCOUNTER — Other Ambulatory Visit: Payer: Self-pay

## 2013-09-13 DIAGNOSIS — R1032 Left lower quadrant pain: Secondary | ICD-10-CM

## 2013-09-14 ENCOUNTER — Ambulatory Visit (INDEPENDENT_AMBULATORY_CARE_PROVIDER_SITE_OTHER): Payer: BC Managed Care – PPO

## 2013-09-14 ENCOUNTER — Other Ambulatory Visit: Payer: Self-pay | Admitting: Gynecology

## 2013-09-14 ENCOUNTER — Encounter: Payer: Self-pay | Admitting: Gynecology

## 2013-09-14 ENCOUNTER — Ambulatory Visit (INDEPENDENT_AMBULATORY_CARE_PROVIDER_SITE_OTHER): Payer: BC Managed Care – PPO | Admitting: Gynecology

## 2013-09-14 DIAGNOSIS — R102 Pelvic and perineal pain: Secondary | ICD-10-CM

## 2013-09-14 DIAGNOSIS — N949 Unspecified condition associated with female genital organs and menstrual cycle: Secondary | ICD-10-CM

## 2013-09-14 DIAGNOSIS — N8300 Follicular cyst of ovary, unspecified side: Secondary | ICD-10-CM

## 2013-09-14 DIAGNOSIS — R1032 Left lower quadrant pain: Secondary | ICD-10-CM

## 2013-09-14 DIAGNOSIS — D259 Leiomyoma of uterus, unspecified: Secondary | ICD-10-CM

## 2013-09-14 DIAGNOSIS — D251 Intramural leiomyoma of uterus: Secondary | ICD-10-CM

## 2013-09-14 NOTE — Patient Instructions (Signed)
Report any further bleeding.

## 2013-09-14 NOTE — Progress Notes (Signed)
Brandy [name] 1960/07/30 [id number]        53 y.o.  G3P3003 presents for ultrasound and exam. Patient called earlier complaining of left sided pain. History of leiomyoma and menorrhagia. Her menses have become more sporadic with LMP January 2015. She notes her pain has disappeared since it started 2 weeks ago. No nausea vomiting diarrhea constipation or urinary symptoms. Recent FSH 34.8.  Past medical history,surgical history, problem list, medications, allergies, family history and social history were all reviewed and documented in the EPIC chart.  Directed ROS with pertinent positives and negatives documented in the history of present illness/assessment and plan.  Exam: [name] assistant General appearance  Normal Abdomen soft nontender without masses guarding rebound or organomegaly. Pelvic external BUS vagina normal with blood staining. Cervix normal. Uterus normal size mobile nontender. Adnexa without masses or tenderness.  Ultrasound shows uterus generous in size with fundal myoma 30 mm. Right and left ovaries with physiologic cystic changes. Cul-de-sac negative.  Assessment/Plan:  53 y.o. G3P3003 with episode of pain 2 weeks ago now starting to bleed. I think she had an ovulatory episode is now having a withdrawal bleed. [place] is borderline elevated I think she is in the perimenopause. Patient will keep menstrual calendar as long as less frequent but regular menses when they come we'll monitor. She has prolonged her typical bleeding or goes more than a year and then has bleeding she does report this. We'll   Note: This document was prepared with digital dictation and possible smart phrase technology. Any transcriptional errors that result from this process are unintentional.   [name] MD, 3:24 PM 09/14/2013

## 2013-10-03 ENCOUNTER — Other Ambulatory Visit (HOSPITAL_COMMUNITY): Payer: Self-pay | Admitting: Endocrinology

## 2013-10-03 DIAGNOSIS — R946 Abnormal results of thyroid function studies: Secondary | ICD-10-CM

## 2013-10-11 ENCOUNTER — Other Ambulatory Visit: Payer: Self-pay | Admitting: Endocrinology

## 2013-10-11 DIAGNOSIS — R946 Abnormal results of thyroid function studies: Secondary | ICD-10-CM

## 2013-10-13 ENCOUNTER — Ambulatory Visit
Admission: RE | Admit: 2013-10-13 | Discharge: 2013-10-13 | Disposition: A | Discharge status: Home / Self Care | Payer: BC Managed Care – PPO | Source: Ambulatory Visit | Attending: Endocrinology | Admitting: Endocrinology

## 2013-10-13 DIAGNOSIS — R946 Abnormal results of thyroid function studies: Secondary | ICD-10-CM

## 2013-10-17 HISTORY — PX: COLONOSCOPY: SHX174

## 2013-10-20 ENCOUNTER — Ambulatory Visit (HOSPITAL_COMMUNITY)
Admission: RE | Admit: 2013-10-20 | Discharge: 2013-10-20 | Disposition: A | Discharge status: Home / Self Care | Payer: BC Managed Care – PPO | Source: Ambulatory Visit | Attending: Endocrinology | Admitting: Endocrinology

## 2013-10-20 DIAGNOSIS — R946 Abnormal results of thyroid function studies: Secondary | ICD-10-CM | POA: Insufficient documentation

## 2013-10-21 ENCOUNTER — Encounter (HOSPITAL_COMMUNITY): Payer: Self-pay

## 2013-10-21 ENCOUNTER — Encounter (HOSPITAL_COMMUNITY)
Admission: RE | Admit: 2013-10-21 | Discharge: 2013-10-21 | Disposition: A | Discharge status: Home / Self Care | Payer: BC Managed Care – PPO | Source: Ambulatory Visit | Attending: Endocrinology | Admitting: Endocrinology

## 2013-10-21 MED ORDER — SODIUM PERTECHNETATE TC 99M INJECTION
11.0000 | Freq: Once | INTRAVENOUS | Status: AC | PRN
  Administered 2013-10-21: 11 via INTRAVENOUS

## 2013-10-21 MED ORDER — SODIUM IODIDE I 131 CAPSULE
12.0000 | Freq: Once | INTRAVENOUS | Status: AC | PRN
  Administered 2013-10-20: 12 via ORAL

## 2013-11-15 ENCOUNTER — Other Ambulatory Visit: Payer: Self-pay

## 2013-11-15 DIAGNOSIS — Z1231 Encounter for screening mammogram for malignant neoplasm of breast: Secondary | ICD-10-CM

## 2013-11-29 ENCOUNTER — Encounter: Payer: Self-pay | Admitting: Cardiology

## 2013-11-29 ENCOUNTER — Ambulatory Visit (INDEPENDENT_AMBULATORY_CARE_PROVIDER_SITE_OTHER): Payer: BC Managed Care – PPO | Admitting: Cardiology

## 2013-11-29 VITALS — BP 132/82 | HR 88 | Ht 65.0 in | Wt 122.3 lb

## 2013-11-29 DIAGNOSIS — R0600 Dyspnea, unspecified: Secondary | ICD-10-CM | POA: Insufficient documentation

## 2013-11-29 DIAGNOSIS — R0609 Other forms of dyspnea: Secondary | ICD-10-CM

## 2013-11-29 DIAGNOSIS — R002 Palpitations: Secondary | ICD-10-CM

## 2013-11-29 DIAGNOSIS — R079 Chest pain, unspecified: Secondary | ICD-10-CM

## 2013-11-29 DIAGNOSIS — R0989 Other specified symptoms and signs involving the circulatory and respiratory systems: Secondary | ICD-10-CM

## 2013-11-29 NOTE — Patient Instructions (Signed)
Your physician recommends that you schedule a follow-up appointment in: [place] physician has requested that you have an exercise tolerance test. For further information please visit [email or web address]. Please also follow instruction sheet, as given.   Exercise Stress Electrocardiogram An exercise stress electrocardiogram is a test to check how blood flows to your heart. It is done to find areas of poor blood flow. You will need to walk on a treadmill for this test. The electrocardiogram will record your heartbeat when you are at rest and when you are exercising. BEFORE THE PROCEDURE  Do not have drinks with caffeine or foods with caffeine for 24 hours before the test, or as told by your doctor.  Follow your doctor's instructions about eating and drinking before the test.  Ask your doctor what medicines you should or should not take before the test. Take your medicines with water unless told by your doctor not to.  If you use an inhaler, bring it with you to the test.  Bring a snack to eat after the test.  Do not  smoke for 4 hours before the test.  Wear comfortable shoes and clothing. PROCEDURE  You will have patches put on your chest. Small areas of your chest may need to be shaved. Wires will be connected to the patches.  Your heart rate will be watched while you are resting and while you are exercising.  You will walk on the treadmill. The treadmill will slowly get faster to raise your heart rate.  The test will take about 1-2 hours. AFTER THE PROCEDURE  Your heart rate and blood pressure will be watched after the test.  You may return to your normal diet, activities, and medicines or as told by your doctor. Document Released: 10/22/2007 Document Revised: 02/23/2013 Document Reviewed: 01/10/2013 [facility] Patient Information 2015 [place], [place]. This information is not intended to replace advice given to you by your health care provider. Make sure you discuss any  questions you have with your health care provider.

## 2013-11-29 NOTE — Progress Notes (Signed)
HPI: 53 yo female for evaluation of palpitations. Echo 3/08 normal. Cath 2008 normal. Patient recently diagnosed for hyperthyroid. Patient has occasional chest pain. It is "mild". It occurs with exertion. It does not radiate. No associated symptoms. No dyspnea on exertion, orthopnea, PND or pedal edema. In the winter she had palpitations described as a flutter. However these have resolved. No syncope.  Current Outpatient Prescriptions  Medication Sig Dispense Refill  . ALPRAZolam (XANAX) 0.5 MG tablet Take 1/2 to 1 tablet daily as needed for acute anxiety/insomnia      . cholecalciferol (VITAMIN D) 1000 UNITS tablet Take 1,000 Units by mouth daily.      Marland Kitchen ibuprofen (ADVIL,MOTRIN) 200 MG tablet Take 400 mg by mouth every 6 (six) hours as needed. For headache      . Multiple Vitamin (MULTIVITAMIN) tablet Take 1 tablet by mouth daily.        Marland Kitchen zolpidem (AMBIEN CR) 12.5 MG CR tablet Take 1 tablet by mouth as needed.       No current facility-administered medications for this visit.    No Known Allergies  Past Medical History  Diagnosis Date  . 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 High cholesterol   . Hypertension   . Asthma     adult onset  . 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 Hyperthyroidism     Past Surgical History  Procedure Laterality Date  . No past surgeries      History   Social History  . Marital Status: Married    Spouse Name: N/A    Number of Children: 3  . Years of Education: N/A   Occupational History  .      [employer]   Social History Main Topics  . Smoking status: Never Smoker   . Smokeless tobacco: Never Used  . Alcohol Use: No  . Drug Use: No  . Sexual Activity: Yes    Birth Control/ Protection: None   Other Topics Concern  . Not on file   Social History Narrative  . No narrative on file    Family History  Problem Relation Age of Onset  . Heart disease Father 82  . Hypertension Father   .  Diabetes Father   . Hypertension Sister   . Diabetes Sister   . Hypertension Brother   . Diabetes Brother   . Stroke Brother   . Breast cancer Maternal Aunt     40's  . Breast cancer Maternal Grandmother 80  . Cancer Maternal Grandfather     colon  . Heart disease Paternal Grandmother   . Diabetes Paternal Grandmother   . Heart disease Paternal Grandfather   . Diabetes Paternal Grandfather   . Breast cancer Maternal Aunt     40's  . Diabetes Mother     ROS: no fevers or chills, productive cough, hemoptysis, dysphasia, odynophagia, melena, hematochezia, dysuria, hematuria, rash, seizure activity, orthopnea, PND, pedal edema, claudication. Remaining systems are negative.  Physical Exam:   Blood pressure 132/82, pulse 88, height 5\' 5"  (1.651 m), weight 122 lb 4.8 oz (55.475 kg).  General:  Well developed/well nourished in NAD Skin warm/dry Patient not depressed No peripheral clubbing Back-normal HEENT-normal/normal eyelids Neck supple/normal carotid upstroke bilaterally; no bruits; no JVD; no thyromegaly chest - CTA/ normal expansion CV - RRR/normal S1 and S2; no murmurs, rubs or gallops;  PMI nondisplaced Abdomen -NT/ND, no HSM, no mass, + bowel sounds, no bruit 2+ femoral pulses, no  bruits Ext-no edema, chords, 2+ DP Neuro-grossly nonfocal  ECG Normal sinus rhythm at a rate of 88. No ST changes.

## 2013-11-29 NOTE — Assessment & Plan Note (Signed)
Brief palpitations last winter. These have resolved. She has been diagnosed with hyperthyroidism. We'll not pursue further workup unless symptoms recur.

## 2013-11-29 NOTE — Assessment & Plan Note (Signed)
Symptoms are somewhat atypical. She did have a catheterization 7 years ago that was normal. Electrocardiogram no ST changes. Plan exercise treadmill for risk stratification.

## 2013-12-06 ENCOUNTER — Telehealth (HOSPITAL_COMMUNITY): Payer: Self-pay

## 2013-12-06 NOTE — Telephone Encounter (Signed)
Encounter complete. 

## 2013-12-08 ENCOUNTER — Encounter (HOSPITAL_COMMUNITY): Payer: BC Managed Care – PPO

## 2013-12-13 ENCOUNTER — Telehealth (HOSPITAL_COMMUNITY): Payer: Self-pay

## 2013-12-13 NOTE — Telephone Encounter (Signed)
Encounter complete. 

## 2013-12-15 ENCOUNTER — Encounter (HOSPITAL_COMMUNITY): Payer: BC Managed Care – PPO

## 2013-12-26 ENCOUNTER — Ambulatory Visit: Payer: BC Managed Care – PPO

## 2013-12-30 ENCOUNTER — Ambulatory Visit
Admission: RE | Admit: 2013-12-30 | Discharge: 2013-12-30 | Disposition: A | Discharge status: Home / Self Care | Payer: BC Managed Care – PPO | Source: Ambulatory Visit

## 2013-12-30 DIAGNOSIS — Z1231 Encounter for screening mammogram for malignant neoplasm of breast: Secondary | ICD-10-CM

## 2014-01-03 ENCOUNTER — Other Ambulatory Visit: Payer: Self-pay | Admitting: Gynecology

## 2014-01-03 DIAGNOSIS — R928 Other abnormal and inconclusive findings on diagnostic imaging of breast: Secondary | ICD-10-CM

## 2014-01-10 ENCOUNTER — Ambulatory Visit
Admission: RE | Admit: 2014-01-10 | Discharge: 2014-01-10 | Disposition: A | Discharge status: Home / Self Care | Payer: BC Managed Care – PPO | Source: Ambulatory Visit | Attending: Gynecology | Admitting: Gynecology

## 2014-01-10 ENCOUNTER — Other Ambulatory Visit: Payer: Self-pay | Admitting: Gynecology

## 2014-01-10 DIAGNOSIS — R928 Other abnormal and inconclusive findings on diagnostic imaging of breast: Secondary | ICD-10-CM

## 2014-01-11 ENCOUNTER — Other Ambulatory Visit: Payer: Self-pay | Admitting: Gynecology

## 2014-01-11 ENCOUNTER — Telehealth: Payer: Self-pay | Admitting: Gynecology

## 2014-01-11 ENCOUNTER — Ambulatory Visit
Admission: RE | Admit: 2014-01-11 | Discharge: 2014-01-11 | Disposition: A | Discharge status: Home / Self Care | Payer: BC Managed Care – PPO | Source: Ambulatory Visit | Attending: Gynecology | Admitting: Gynecology

## 2014-01-11 DIAGNOSIS — R928 Other abnormal and inconclusive findings on diagnostic imaging of breast: Secondary | ICD-10-CM

## 2014-01-11 DIAGNOSIS — C50911 Malignant neoplasm of unspecified site of right female breast: Secondary | ICD-10-CM

## 2014-01-11 NOTE — Telephone Encounter (Signed)
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 She has already discussed this with the radiologist and has an appointment with the multidisciplinary committee. I offered my support and she'll call me if she has any issues.

## 2014-01-12 ENCOUNTER — Telehealth: Payer: Self-pay

## 2014-01-12 NOTE — Telephone Encounter (Signed)
I received prior auth from [facility] for patient's bilateral breast MRI scheudled 01/20/14.  Josem Kaufmann #[id number]  Valid until 02/11/14 per Adela Lank.

## 2014-01-16 ENCOUNTER — Ambulatory Visit
Admission: RE | Admit: 2014-01-16 | Discharge: 2014-01-16 | Disposition: A | Discharge status: Home / Self Care | Payer: BC Managed Care – PPO | Source: Ambulatory Visit | Attending: Gynecology | Admitting: Gynecology

## 2014-01-16 ENCOUNTER — Telehealth: Payer: Self-pay | Admitting: *Deleted

## 2014-01-16 DIAGNOSIS — C50912 Malignant neoplasm of unspecified site of left female breast: Secondary | ICD-10-CM

## 2014-01-16 DIAGNOSIS — C50911 Malignant neoplasm of unspecified site of right female breast: Secondary | ICD-10-CM | POA: Insufficient documentation

## 2014-01-16 DIAGNOSIS — C50411 Malignant neoplasm of upper-outer quadrant of right female breast: Secondary | ICD-10-CM

## 2014-01-16 MED ORDER — GADOBENATE DIMEGLUMINE 529 MG/ML IV SOLN
11.0000 mL | Freq: Once | INTRAVENOUS | Status: AC | PRN
  Administered 2014-01-16: 11 mL via INTRAVENOUS

## 2014-01-16 NOTE — Telephone Encounter (Signed)
Confirmed [facility] for 01/18/14 at 1230pm .  Instructions and contact information given.

## 2014-01-17 ENCOUNTER — Other Ambulatory Visit: Payer: Self-pay | Admitting: Gynecology

## 2014-01-17 DIAGNOSIS — R928 Other abnormal and inconclusive findings on diagnostic imaging of breast: Secondary | ICD-10-CM

## 2014-01-18 ENCOUNTER — Ambulatory Visit (HOSPITAL_BASED_OUTPATIENT_CLINIC_OR_DEPARTMENT_OTHER): Payer: BC Managed Care – PPO | Admitting: Hematology and Oncology

## 2014-01-18 ENCOUNTER — Encounter: Payer: Self-pay | Admitting: Hematology and Oncology

## 2014-01-18 ENCOUNTER — Other Ambulatory Visit (HOSPITAL_BASED_OUTPATIENT_CLINIC_OR_DEPARTMENT_OTHER): Payer: BC Managed Care – PPO

## 2014-01-18 ENCOUNTER — Ambulatory Visit
Admission: RE | Admit: 2014-01-18 | Discharge: 2014-01-18 | Disposition: A | Discharge status: Home / Self Care | Payer: BC Managed Care – PPO | Source: Ambulatory Visit | Attending: Radiation Oncology | Admitting: Radiation Oncology

## 2014-01-18 ENCOUNTER — Ambulatory Visit: Payer: BC Managed Care – PPO

## 2014-01-18 ENCOUNTER — Ambulatory Visit: Payer: BC Managed Care – PPO | Attending: General Surgery | Admitting: Physical Therapy

## 2014-01-18 VITALS — BP 151/91 | HR 89 | Temp 98.1°F | Resp 18 | Ht 65.0 in | Wt 122.2 lb

## 2014-01-18 DIAGNOSIS — C50919 Malignant neoplasm of unspecified site of unspecified female breast: Secondary | ICD-10-CM | POA: Diagnosis not present

## 2014-01-18 DIAGNOSIS — C50419 Malignant neoplasm of upper-outer quadrant of unspecified female breast: Secondary | ICD-10-CM

## 2014-01-18 DIAGNOSIS — C50411 Malignant neoplasm of upper-outer quadrant of right female breast: Secondary | ICD-10-CM

## 2014-01-18 DIAGNOSIS — Z170 Estrogen receptor positive status [ER+]: Secondary | ICD-10-CM

## 2014-01-18 DIAGNOSIS — IMO0001 Reserved for inherently not codable concepts without codable children: Secondary | ICD-10-CM | POA: Diagnosis present

## 2014-01-18 LAB — COMPREHENSIVE METABOLIC PANEL (CC13)
ALT: 26 U/L (ref 0–55)
AST: 22 U/L (ref 5–34)
Albumin: 3.8 g/dL (ref 3.5–5.0)
Alkaline Phosphatase: 56 U/L (ref 40–150)
Anion Gap: 7 mEq/L (ref 3–11)
BUN: 12.3 mg/dL (ref 7.0–26.0)
CO2: 25 mEq/L (ref 22–29)
Calcium: 9.2 mg/dL (ref 8.4–10.4)
Chloride: 108 mEq/L (ref 98–109)
Creatinine: 0.7 mg/dL (ref 0.6–1.1)
Glucose: 93 mg/dl (ref 70–140)
Potassium: 3.9 mEq/L (ref 3.5–5.1)
Sodium: 140 mEq/L (ref 136–145)
Total Bilirubin: 0.5 mg/dL (ref 0.20–1.20)
Total Protein: 6.9 g/dL (ref 6.4–8.3)

## 2014-01-18 LAB — CBC WITH DIFFERENTIAL/PLATELET
BASO%: 0.8 % (ref 0.0–2.0)
Basophils Absolute: 0 10*3/uL (ref 0.0–0.1)
EOS%: 0.7 % (ref 0.0–7.0)
Eosinophils Absolute: 0 10*3/uL (ref 0.0–0.5)
HCT: 40.5 % (ref 34.8–46.6)
HGB: 13.2 g/dL (ref 11.6–15.9)
LYMPH%: 23 % (ref 14.0–49.7)
MCH: 30.5 pg (ref 25.1–34.0)
MCHC: 32.7 g/dL (ref 31.5–36.0)
MCV: 93.1 fL (ref 79.5–101.0)
MONO#: 0.4 10*3/uL (ref 0.1–0.9)
MONO%: 6 % (ref 0.0–14.0)
NEUT#: 4.1 10*3/uL (ref 1.5–6.5)
NEUT%: 69.5 % (ref 38.4–76.8)
Platelets: 252 10*3/uL (ref 145–400)
RBC: 4.35 10*6/uL (ref 3.70–5.45)
RDW: 12.9 % (ref 11.2–14.5)
WBC: 6 10*3/uL (ref 3.9–10.3)
lymph#: 1.4 10*3/uL (ref 0.9–3.3)

## 2014-01-18 NOTE — Progress Notes (Signed)
[facility] CONSULT NOTE  Patient Care Team: Sheela Stack, MD as PCP - General (Endocrinology) [name], MD as Consulting Physician (Gynecology) Autumn Messing III, MD as Consulting Physician (General Surgery) Rulon Eisenmenger, MD as Consulting Physician (Hematology and Oncology) Rexene Edison, MD as Consulting Physician (Radiation Oncology)  CHIEF COMPLAINTS/PURPOSE OF CONSULTATION:  Newly diagnosed breast cancer  HISTORY OF PRESENTING ILLNESS:  Brandy [name] 53 y.o. 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 She then underwent an MRI of the breast on 01/10/2014 that revealed 1.5 a 1.4 x 1.47 a mass along with that a 2 cm non-mass enhancement was also noted anterior to the previously biopsied mass Extending to the nipple. In addition there was a left breast abnormality 6 mm in size. Patient is being scheduled to undergo left breast biopsy by ultrasound on 01/20/2014 and a right breast biopsy by MRI on 01/26/2014. She is here accompanied by her family to discuss the approach to biopsies and treatment plan.  I reviewed her records extensively and collaborated the history with the patient.  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  She was never exposed to fertility medications or hormone replacement therapy.  She has family history of Breast/GYN/GI cancer  MEDICAL HISTORY:  Past Medical History  Diagnosis Date  . 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 High cholesterol   . Asthma     adult onset  . 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 Hyperthyroidism   . Breast cancer     SURGICAL HISTORY: Past Surgical History  Procedure Laterality Date  . No past surgeries    . Cardiac catheterization  2008    SOCIAL HISTORY: History   Social History  . Marital Status: Married    Spouse Name: N/A    Number of Children: 3  . Years of Education: N/A   Occupational History  .      [employer] dealership   Social History Main Topics  . Smoking status: Never Smoker   . Smokeless tobacco: Never Used  . Alcohol Use: No  .  Drug Use: No  . Sexual Activity: Yes    Birth Control/ Protection: None   Other Topics Concern  . Not on file   Social History Narrative  . No narrative on file    FAMILY HISTORY: Family History  Problem Relation Age of Onset  . Heart disease Father 20  . Hypertension Father   . Diabetes Father   . Hypertension Sister   . Diabetes Sister   . Hypertension Brother   . Diabetes Brother   . Stroke Brother   . Breast cancer Maternal Aunt      40's  . Breast cancer Maternal Grandmother 80  . Cancer Maternal Grandfather     colon  . Heart disease Paternal Grandmother   . Diabetes Paternal Grandmother   . Heart disease Paternal Grandfather   . Diabetes Paternal Grandfather   . Breast cancer Maternal Aunt     40's  . Diabetes Mother     ALLERGIES:  has No Known Allergies.  MEDICATIONS:  Current Outpatient Prescriptions  Medication Sig Dispense Refill  . ALPRAZolam (XANAX) 0.5 MG tablet Take 1/2 to 1 tablet daily as needed for acute anxiety/insomnia      . cholecalciferol (VITAMIN D) 1000 UNITS tablet Take 1,000 Units by mouth daily.      Marland Kitchen ibuprofen (ADVIL,MOTRIN) 200 MG tablet Take 400 mg by mouth every 6 (six) hours as needed. For headache      . Multiple Vitamin (MULTIVITAMIN) tablet Take 1 tablet by mouth daily.        Marland Kitchen zolpidem (AMBIEN CR) 12.5 MG CR tablet Take 1 tablet by mouth as needed.       No current facility-administered medications for this visit.    REVIEW OF SYSTEMS:   Constitutional: Denies fevers, chills or abnormal night sweats Eyes: Denies blurriness of vision, double vision or watery eyes Ears, nose, mouth, throat, and face: Denies mucositis or sore throat Respiratory: Denies cough, dyspnea or wheezes Cardiovascular: Denies palpitation, chest discomfort or lower extremity swelling Gastrointestinal:  Denies nausea, heartburn or change in bowel habits Skin: Denies abnormal skin rashes Lymphatics: Denies new lymphadenopathy or easy bruising Neurological:Denies numbness, tingling or new weaknesses Behavioral/Psych: Mood is stable, no new changes  Breast: Denies any palpable lumps or discharge All other systems were reviewed with the patient and are negative.  PHYSICAL EXAMINATION: ECOG PERFORMANCE STATUS: 0 - Asymptomatic  Filed Vitals:   01/18/14 1315  BP: 151/91  Pulse: 89  Temp: 98.1 F (36.7 C)  Resp: 18   Filed Weights   01/18/14 1315  Weight: 122 lb 3.2 oz (55.43 kg)     GENERAL:alert, no distress and comfortable SKIN: skin color, texture, turgor are normal, no rashes or significant lesions EYES: normal, conjunctiva are pink and non-injected, sclera clear OROPHARYNX:no exudate, no erythema and lips, buccal mucosa, and tongue normal  NECK: supple, thyroid normal size, non-tender, without nodularity LYMPH:  no palpable lymphadenopathy in the cervical, axillary or inguinal LUNGS: clear to auscultation and percussion with normal breathing effort HEART: regular rate & rhythm and no murmurs and no lower extremity edema ABDOMEN:abdomen soft, non-tender and normal bowel sounds Musculoskeletal:no cyanosis of digits and no clubbing  PSYCH: alert & oriented x 3 with fluent speech NEURO: no focal motor/sensory deficits BREAST: No palpable nodules in breast. No palpable axillary or supraclavicular lymphadenopathy  LABORATORY DATA:  I have reviewed the data as listed Lab Results  Component Value Date   WBC 6.0 01/18/2014   HGB 13.2 01/18/2014   HCT  40.5 01/18/2014   MCV 93.1 01/18/2014   PLT 252 01/18/2014   Lab Results  Component Value Date   NA 140 01/18/2014   K 3.9 01/18/2014   CL 106 07/27/2013   CO2 25 01/18/2014    RADIOGRAPHIC STUDIES: I have personally reviewed the radiological reports and agreed with the findings in the report.  ASSESSMENT AND PLAN:  Breast cancer of upper-outer quadrant of right female breast 1. Right breast invasive ductal cancer 1.3 cm the ultrasound 1.5 cm by MRI but on MRI there was an enhancement to worsen nipple 2 cm in size the primary mass was biopsy-proven to be invasive ductal cancer ER PR positive HER-2/neu negative Ki-67 10%. The anterior enhancement to worsen nipple is scheduled to be biopsied by MRI on 01/26/2014. Left breast there was also another nodule measuring 6 mm in size which is going to be biopsied by ultrasound 01/20/2014.  2. Discussed with her the details of pathology including the clinical staging the type of  breast cancer, the significance of ER PR and HER-2/neu receptors and the implications for treatment decisions. After reviewing the pathology in detail, we proceeded to discuss the different treatment options between surgery, radiation, chemotherapy, antiestrogen therapies.  3. I discussed with her the significance of doing Oncotype DX if she was lymph node negative disease after surgery to determine if she would benefit from adjuvant systemic chemotherapy. If she does not need chemotherapy then she can go on to radiation if necessary followed by antiestrogen therapy.  4. I opened MRI and the breast films and reviewed these with the patient and showed him the abnormalities I have discussed previously.   All questions were answered. The patient knows to call the clinic with any problems, questions or concerns. I spent 55 minutes counseling the patient face to face. The total time spent in the appointment was 60 minutes and more than 50% was on counseling.     Rulon Eisenmenger, MD 01/18/2014 4:10 PM

## 2014-01-18 NOTE — Progress Notes (Signed)
[facility] NEW PATIENT EVALUATION  Name: Brandy [name] MRN: [id number]  Date:   01/18/2014           DOB: 1960/08/31  Status: outpatient   CC: Sheela Stack, MD  Jovita Kussmaul, MD    REFERRING PHYSICIAN: Autumn Messing III, MD   DIAGNOSIS: Stage I A (T1, N0, M0) invasive ductal carcinoma of the right breast   HISTORY OF PRESENT ILLNESS:  Brandy [name] is a 53 y.o. female who is seen today at the [facility] through the courtesy of Dr. Marlou Starks for discussion of possible radiation therapy in the management of her T1 N0 invasive ductal carcinoma of the right breast. At the time of a screening mammogram at the [facility] on 12/30/2013 she is felt to have a possible right breast mass. Additional views and ultrasound on 01/10/2014 showed a suspicious mass within the right breast at 10:00 in the subareolar location. This measured 1.3 cm. A needle aspiration biopsy on 01/10/2014 was diagnostic for invasive ductal carcinoma along with DCIS. This was ER/PR positive with a Ki-67 of 10%. A MRI scan on 01/16/2014 showed an enhancing oval mass within the upper-outer quadrant of the right breast corresponding to the known biopsy-proven malignancy along with abnormal linear non masslike enhancement within the subareolar right breast just inferior and medial to the known cancer. MRI guided biopsy of the non-masslike enhancement is scheduled for September 10. Within the left breast with a 6 mm enhancing oval mass within the central, inferior left breast for which a biopsy is scheduled for this Friday September for. She is without complaints today. Of note is that she does have a maternal grandmother who was diagnosed with breast cancer at 36 and 2 maternal aunts who were both diagnosed with breast cancer in their 25s.  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     PAST SURGICAL HISTORY:  Past Surgical History  Procedure Laterality Date  . No past surgeries    . Cardiac catheterization  2008     FAMILY HISTORY: family history includes Breast cancer in her maternal aunt and maternal aunt; Breast cancer (age of onset: 58) in her maternal grandmother; Cancer in her maternal grandfather; Diabetes in her brother, father, mother, paternal grandfather, paternal grandmother, and sister; Heart disease in her paternal grandfather and paternal grandmother; Heart disease (age of onset: 42) in her father; Hypertension in her brother, father, and sister; Stroke in her brother. Her mother is alive and well at 67. Her father died from cardiac disease and 36. Her maternal grandmother was diagnosed with breast cancer at age 88 and 2 maternal aunts were diagnosed with breast cancer in their 33s.   SOCIAL HISTORY:  reports that she has never smoked. She has never used smokeless tobacco. She reports that she does not drink alcohol or use illicit drugs. Married, 3 children. 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   ALLERGIES: Review of patient's allergies indicates no known allergies.   MEDICATIONS:  Current Outpatient Prescriptions  Medication Sig Dispense Refill  . ALPRAZolam (XANAX) 0.5 MG tablet Take 1/2 to 1 tablet daily as needed for acute anxiety/insomnia      . cholecalciferol (VITAMIN D) 1000 UNITS tablet Take 1,000 Units by mouth daily.      Marland Kitchen ibuprofen (ADVIL,MOTRIN)  200 MG tablet Take 400 mg by mouth every 6 (six) hours as needed. For headache      . Multiple Vitamin (MULTIVITAMIN) tablet Take 1 tablet by mouth daily.        Marland Kitchen zolpidem (AMBIEN CR) 12.5 MG CR tablet Take 1 tablet by mouth as needed.       No current facility-administered medications for this encounter.     REVIEW OF SYSTEMS:  Pertinent items are noted in HPI.    PHYSICAL  EXAM:  Alert and oriented 53 year old white female appearing younger than her stated age. Wt Readings from Last 3 Encounters:  01/18/14 122 lb 3.2 oz (55.43 kg)  11/29/13 122 lb 4.8 oz (55.475 kg)  07/27/13 112 lb (50.803 kg)   Temp Readings from Last 3 Encounters:  01/18/14 98.1 F (36.7 C) Oral   BP Readings from Last 3 Encounters:  01/18/14 151/91  11/29/13 132/82  07/27/13 112/66   Pulse Readings from Last 3 Encounters:  01/18/14 89  11/29/13 88  01/18/09 74   Head and neck examination: Grossly unremarkable. Nodes: There is no palpable cervical, supraclavicular, or axillary lymphadenopathy. Chest: Lungs clear. Breasts: There is a punctate biopsy wound along the upper-outer quadrant of the right breast at 10:00 with surrounding induration and also induration just deep to the right areolar edge, presumably from a small hematoma. No other masses are appreciated. Left breast without masses or lesions. Extremities: Without edema.    LABORATORY DATA:  Lab Results  Component Value Date   WBC 6.0 01/18/2014   HGB 13.2 01/18/2014   HCT 40.5 01/18/2014   MCV 93.1 01/18/2014   PLT 252 01/18/2014   Lab Results  Component Value Date   NA 140 01/18/2014   K 3.9 01/18/2014   CL 106 07/27/2013   CO2 25 01/18/2014   Lab Results  Component Value Date   ALT 26 01/18/2014   AST 22 01/18/2014   ALKPHOS 56 01/18/2014   BILITOT 0.50 01/18/2014      IMPRESSION: Stage I A (T1, N0, M0) invasive ductal/DCIS of the right breast. In general terms, we discussed partial mastectomy followed by radiation therapy or mastectomy for the treatment of early breast cancer. She'll complete her staging workup with a right breast MRI guided biopsy to determine whether not the non-masslike enhancement extending anteriorly represents DCIS. If so, she may still be a candidate for breast preservation with removal of the nipple areolar complex if Dr. Marlou Starks feels that he can obtain a satisfactory cosmetic result. She will also have a  biopsy of her left breast lesion seen on MRI. We discussed the potential acute and late toxicities of radiation therapy. We also discussed various reconstruction options, and she may be best suited for a tissue expander/implant. Lastly, we feel that she is a candidate for genetic consultation/testing.  PLAN: She will proceed with upcoming biopsies, and Dr. Marlou Starks will determine whether not she is a candidate for breast preservation.  I spent 40  minutes face to face with the patient and more than 50% of that time was spent in counseling and/or coordination of care.

## 2014-01-18 NOTE — Progress Notes (Signed)
Office Note created by MD - copy to patient, original to scan.

## 2014-01-18 NOTE — Assessment & Plan Note (Signed)
1. Right breast invasive ductal cancer 1.3 cm the ultrasound 1.5 cm by MRI but on MRI there was an enhancement to worsen nipple 2 cm in size the primary mass was biopsy-proven to be invasive ductal cancer ER PR positive HER-2/neu negative Ki-67 10%. The anterior enhancement to worsen nipple is scheduled to be biopsied by MRI on 01/26/2014. Left breast there was also another nodule measuring 6 mm in size which is going to be biopsied by ultrasound 01/20/2014.  2. Discussed with her the details of pathology including the clinical staging the type of breast cancer, the significance of ER PR and HER-2/neu receptors and the implications for treatment decisions. After reviewing the pathology in detail, we proceeded to discuss the different treatment options between surgery, radiation, chemotherapy, antiestrogen therapies.  3. I discussed with her the significance of doing Oncotype DX if she was lymph node negative disease after surgery to determine if she would benefit from adjuvant systemic chemotherapy. If she does not need chemotherapy then she can go on to radiation if necessary followed by antiestrogen therapy.  4. I opened MRI of the breast films and reviewed them with the patient and showed him the abnormalities I have discussed previously.

## 2014-01-18 NOTE — Progress Notes (Signed)
Checked in new patient with no financial issues prior to seeing the dr. She has appt card.I advised her of alight fund and to let me know if she is interested.

## 2014-01-20 ENCOUNTER — Ambulatory Visit
Admission: RE | Admit: 2014-01-20 | Discharge: 2014-01-20 | Disposition: A | Discharge status: Home / Self Care | Payer: BC Managed Care – PPO | Source: Ambulatory Visit | Attending: Gynecology | Admitting: Gynecology

## 2014-01-20 ENCOUNTER — Other Ambulatory Visit: Payer: BC Managed Care – PPO

## 2014-01-20 ENCOUNTER — Other Ambulatory Visit: Payer: Self-pay | Admitting: Gynecology

## 2014-01-20 ENCOUNTER — Encounter: Payer: Self-pay | Admitting: General Practice

## 2014-01-20 DIAGNOSIS — R928 Other abnormal and inconclusive findings on diagnostic imaging of breast: Secondary | ICD-10-CM

## 2014-01-20 NOTE — Progress Notes (Signed)
[name] Psychosocial Distress Screening Clinical Social Work  With Management consultant, visit with Brandy [name], husband, dtr, son, and future DIL (marrying next month).  The patient scored a 9 on the Psychosocial Distress Thermometer which indicates severe distress. Chaplain and Counseling Intern assessed for distress and other psychosocial needs.   ONCBCN DISTRESS SCREENING 01/20/2014  Screening Type Initial Screening  Elta [place] the number that describes how much distress you have been experiencing in the past week 9  Emotional problem type Nervousness/Anxiety;Feeling hopeless  Spiritual/Religous concerns type Facing my mortality  Physical Problem type Sleep/insomnia;Loss of appetitie  Referral to clinical social work Yes  Referral to support programs Yes  Other Spiritual Care, Counseling Intern   Brandy [name] reports distress as 8.5, "maybe higher" as there are now "more questions" due to two more biopsies scheduled.  She reports support from family and is also very scared about future prognosis and treatment.    Follow up needed: Yes.    If yes, follow up plan:  Will defer to [facility] for f/u support, check-in via phone and repeated offer for 1:1 and/or family counseling.  Please also page chaplain as other needs arise:  [phone]. Thank you.  [place], [place]

## 2014-01-25 ENCOUNTER — Encounter: Payer: Self-pay | Admitting: Genetic Counselor

## 2014-01-25 ENCOUNTER — Telehealth: Payer: Self-pay

## 2014-01-25 ENCOUNTER — Ambulatory Visit (HOSPITAL_BASED_OUTPATIENT_CLINIC_OR_DEPARTMENT_OTHER): Payer: BC Managed Care – PPO | Admitting: Genetic Counselor

## 2014-01-25 ENCOUNTER — Other Ambulatory Visit: Payer: BC Managed Care – PPO

## 2014-01-25 DIAGNOSIS — C50411 Malignant neoplasm of upper-outer quadrant of right female breast: Secondary | ICD-10-CM

## 2014-01-25 DIAGNOSIS — Z803 Family history of malignant neoplasm of breast: Secondary | ICD-10-CM

## 2014-01-25 DIAGNOSIS — IMO0002 Reserved for concepts with insufficient information to code with codable children: Secondary | ICD-10-CM

## 2014-01-25 DIAGNOSIS — C50919 Malignant neoplasm of unspecified site of unspecified female breast: Secondary | ICD-10-CM

## 2014-01-25 NOTE — Progress Notes (Signed)
Patient Name: Brandy [name] Patient Age: 53 y.o. Encounter Date: 01/25/2014  Referring Physician: Nicholas Lose, MD  Primary Care Provider: Sheela Stack, MD   Brandy [name], a 53 y.o. female, is being seen at the [facility] due to a personal and family history of breast cancer.  She presents to clinic today with her daughter to discuss the possibility of a hereditary predisposition to cancer and discuss whether genetic testing is warranted.  HISTORY OF PRESENT ILLNESS: Brandy [name] was recently diagnosed with bilateral breast cancer at the age of 24. A surgical decision has not yet been made. She is scheduled for another right breast biopsy tomorrow.  Both breast tumors were IDC/DCIS. The right breast tumor was ER/PR positive and HER2 negative. We do not have receptor status available for the left side.  Brandy [name] stated that a colonoscopy 2-3 months revealed 8 colon polyps, but that only one was adenomatous.   Past Medical History  Diagnosis Date  . 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 High cholesterol   . Asthma     adult onset  . 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 Hyperthyroidism   . Breast cancer     Past Surgical History  Procedure Laterality Date  . No past surgeries    . Cardiac catheterization  2008    History   Social History  . Marital Status: Married    Spouse Name: N/A    Number of Children: 3  . Years of Education: N/A   Occupational History  .      [employer] dealership   Social History Main Topics  . Smoking status: Never Smoker   . Smokeless tobacco: Never Used  . Alcohol Use: No  . Drug Use: No  . Sexual Activity: Yes    Birth Control/ Protection: None   Other Topics Concern  . Not on file   Social History Narrative  . No narrative on file     FAMILY HISTORY:   During the visit, a 4-generation pedigree was obtained. Brandy [name] has a daughter (age 58) and two sons (ages 70 and 82). She has  two sisters (ages 70 and 15) and a brother (age 82). Her mother is 2 and cancer-free. Of her mother's 4 sisters, 2 reportedly had breast cancer in their 58s. Another had brain cancer, but the primary site is unknown. Brandy [name] maternal grandmother had breast cancer at age 61 and passed away at 33. Her maternal grandfather had colon cancer at 17 and passed away at 66.  Her father died at 21, cancer-free. He had only one sister (age 75) who is cancer-free. Her paternal grandparents died in their 79s, cancer-free.  [name] ancestry is English. There is no known Jewish ancestry and no consanguinity.  ASSESSMENT AND PLAN: Brandy [name] is a 53 y.o. female with a personal history of bilateral breast cancer and a family history of breast cancer at young ages in 22 of 4 maternal aunts. This history is somewhat suggestive of a hereditary predisposition to cancer, although her mother is 66 and cancer-free. We reviewed the characteristics, features and inheritance patterns of hereditary cancer syndromes. We also discussed genetic testing, including the process of testing, insurance coverage and implications of results. A negative result will be overall reassuring for Brandy [name] and her family.  [name] wished to pursue genetic testing and a blood sample will be sent to [facility] for analysis. BRCA1 and BRCA2 testing was requested  first as a STAT request. This result should be available in ~2 weeks. If no mutation is identified, the lab will proceed with testing the other 15 genes on the BreastNext gene panel. We discussed the implications of a positive, negative and/ or Variant of Uncertain Significance (VUS) result. We will contact Brandy [name] after each result and address implications for her as well as address genetic testing for at-risk family members, if needed.    We encouraged Brandy [name] to remain in contact with Cancer Genetics annually so that we can update the family history and inform her of any  changes in cancer genetics and testing that may be of benefit for this family. Ms.  Nolton questions were answered to her satisfaction today.   Thank you for the referral and allowing [place] to share in the care of your patient.   The patient was seen for a total of 45 minutes, greater than 50% of which was spent face-to-face counseling. This patient was discussed with the overseeing provider who agrees with the above.   Steele Berg, MS, [place] Certified Genetic Counseor phone: [phone] [email or web address]

## 2014-01-25 NOTE — Telephone Encounter (Signed)
Patient asked if you would call her. She said she spoke to you last week when she found out she had breast cancer. She said they have since found it in the other breast as well. She said she just had a few questions she wanted to talk with you about.  Her # is [phone].

## 2014-01-25 NOTE — Telephone Encounter (Signed)
I returned the patient's call and we discussed several issues pertaining to her breast cancer evaluation and potential treatment. I offered emotional support and she will call me if she needs anything further.

## 2014-01-26 ENCOUNTER — Ambulatory Visit: Payer: BC Managed Care – PPO

## 2014-01-26 ENCOUNTER — Other Ambulatory Visit: Payer: Self-pay | Admitting: Gynecology

## 2014-01-26 ENCOUNTER — Ambulatory Visit
Admission: RE | Admit: 2014-01-26 | Discharge: 2014-01-26 | Disposition: A | Discharge status: Home / Self Care | Payer: BC Managed Care – PPO | Source: Ambulatory Visit | Attending: Gynecology | Admitting: Gynecology

## 2014-01-26 DIAGNOSIS — R928 Other abnormal and inconclusive findings on diagnostic imaging of breast: Secondary | ICD-10-CM

## 2014-01-26 MED ORDER — GADOBENATE DIMEGLUMINE 529 MG/ML IV SOLN
11.0000 mL | Freq: Once | INTRAVENOUS | Status: AC | PRN
  Administered 2014-01-26: 11 mL via INTRAVENOUS

## 2014-01-30 ENCOUNTER — Telehealth: Payer: Self-pay | Admitting: *Deleted

## 2014-01-30 NOTE — Telephone Encounter (Signed)
Spoke with patient today from [facility] 01/18/14. She is anxious to discuss surgery options with Dr. Marlou Starks and get things moving.  She does have her biopsy results. Informed her I would send a message to get her scheduled with Dr. Marlou Starks for follow up post biopsy.

## 2014-02-01 ENCOUNTER — Telehealth: Payer: Self-pay

## 2014-02-01 NOTE — Telephone Encounter (Signed)
Spoke with Dr. Lindi Adie JO:[id number] plan for patient.  Dr. Lindi Adie consulted with Dr. Marlou Starks.  Pt is to go to surgery first, and will be seen at the clinic post surgery.  Called patient to let her know she should be hearing from Dr. Marlou Starks.  Patient reports she has an appointment with Dr. Marlou Starks in the morning.  Pt is very scared and is looking for guidance in her decision making as to surgery eg- lumpectomy vs mastectomy.  Pt is very concerned that cancer may have spread and is asking how can she know.  Let pt know that Dr. Lindi Adie has agreed with Dr. Marlou Starks that she should have surgery first.  As to which surgery to have, I let the patient know that she should have this discussion with Dr. Marlou Starks.  I told her she should have discussion with Dr. Marlou Starks as to need for any additional studies as she is seeing him tomorrow.  Had conversation with patient about emotional effect of surgery and the need to treat the cancer as the first priority.  Reassured patient that MDs were coordinating her care and they would take good care of her.  Pt voiced understanding.   On the phone with patient approx 20 minutes.

## 2014-02-06 ENCOUNTER — Encounter: Payer: Self-pay | Admitting: Genetic Counselor

## 2014-02-06 NOTE — Progress Notes (Signed)
Referring Physician: Nicholas Lose MD   Brandy [name] was called today to discuss the first of her genetic test results. Please see the Genetics note from her visit on 01/25/14.   GENETIC TESTING: At the time of Brandy [name] visit, we recommended she pursue genetic testing of multiple genes. Given the use of this information for surgical management, BRCA1/BRCA2 analysis was ordered first. This test includs sequencing and deletion/duplication analysis and was performed at [facility]. Testing was normal and did not reveal a mutation in either of these two genes. Testing for the other genes on the BreastNext gene panel is pending. Once results are obtained, Brandy [name] will be called again.   Steele Berg, MS, [place] Certified Genetic Counseor phone: [phone] [name]

## 2014-02-09 ENCOUNTER — Telehealth: Payer: Self-pay | Admitting: *Deleted

## 2014-02-09 NOTE — Telephone Encounter (Signed)
Received call from patient stating she would like to come in to discuss her plan of care with Dr. Lindi Adie.  She states now that she has had time to process everything she has lots of questions.  Per Dr. Lindi Adie patient can come in at 845 on 02/10/14.  Patient states she could come in at [place].  Appointment made.

## 2014-02-10 ENCOUNTER — Encounter: Payer: Self-pay | Admitting: *Deleted

## 2014-02-10 ENCOUNTER — Ambulatory Visit (HOSPITAL_BASED_OUTPATIENT_CLINIC_OR_DEPARTMENT_OTHER): Payer: BC Managed Care – PPO | Admitting: Hematology and Oncology

## 2014-02-10 VITALS — BP 143/79 | HR 78 | Temp 97.9°F | Resp 18 | Ht 65.0 in | Wt 123.1 lb

## 2014-02-10 DIAGNOSIS — Z170 Estrogen receptor positive status [ER+]: Secondary | ICD-10-CM

## 2014-02-10 DIAGNOSIS — C50919 Malignant neoplasm of unspecified site of unspecified female breast: Secondary | ICD-10-CM

## 2014-02-10 DIAGNOSIS — C50411 Malignant neoplasm of upper-outer quadrant of right female breast: Secondary | ICD-10-CM

## 2014-02-10 MED ORDER — TAMOXIFEN CITRATE 20 MG PO TABS: 20.0000 mg | ORAL_TABLET | Freq: Every day | ORAL | Status: DC

## 2014-02-10 NOTE — Progress Notes (Signed)
Faxed insurance card, face sheet, office notes, mammogram,u/s and pathology reports to [facility].

## 2014-02-10 NOTE — Progress Notes (Signed)
Patient Care Team: Sheela Stack, MD as PCP - General (Endocrinology) [name], MD as Consulting Physician (Gynecology) Autumn Messing III, MD as Consulting Physician (General Surgery) Rulon Eisenmenger, MD as Consulting Physician (Hematology and Oncology)  DIAGNOSIS: Bilateral breast cancer   Primary site: Breast (Right)   Staging method: AJCC 7th Edition   Clinical: Stage IA (T1c, N0, cM0)   Summary: Stage IA (T1c, N0, cM0)   Clinical comments: Staged at breast conference on 01/18/14.   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   01/20/2014 Procedure Left breast 6:00 position biopsy: IDC with DCIS ER 100%, PR 45%, Ki-67 5%, HER-2 negative ratio 1.13; clinical T1 B. N0 M0 stage IA   02/06/2014 Procedure BRCA1 and 2 are negative, Breast Next panel pending    CHIEF COMPLIANT: Patient is here to discuss breast cancer management  INTERVAL HISTORY: Mrs. Monte he is a 53 year old Caucasian lady with above-mentioned history of bilateral breast cancers. The MRI picked up the cancer in the left breast. After the [facility] clinic, she underwent biopsy of the left breast under ultrasound guidance. This confirmed that she has another invasive ductal carcinoma the left breast that was ER/PR positive HER-2 negative. Ki-67 was at 5%. There was an attempt made to do an MRI directed biopsy of the right breast  periareolar extension of the breast cancer but it could not be done. Because of this patient's husband was extremely frustrated and wanted to felt to [facility].  Patient is also extremely confused about the approach she needs to take regarding her breast surgery. And she wanted to know what my recommendation would be.   REVIEW OF SYSTEMS:   Constitutional: Denies fevers, chills or abnormal weight loss Eyes: Denies blurriness of vision Ears, nose, mouth, throat, and face: Denies mucositis or sore throat Respiratory: Denies cough, dyspnea or wheezes Cardiovascular: Denies palpitation, chest discomfort or lower extremity swelling Gastrointestinal:  Denies nausea, heartburn or change in bowel habits Skin: Denies abnormal skin rashes Lymphatics: Denies new lymphadenopathy or easy bruising Neurological:Denies numbness, tingling or new weaknesses Behavioral/Psych: Patient is very stressed out about how to best take care of her breast cancer because of all the choices and options. All other systems were reviewed with the patient and are negative.  I have reviewed the past medical history, past surgical history, social history and family history with the patient and they are unchanged from previous note.  ALLERGIES:  has No Known Allergies.  MEDICATIONS:  Current Outpatient Prescriptions  Medication Sig Dispense Refill  . ALPRAZolam (XANAX) 0.5 MG tablet Take 1/2 to 1 tablet daily as needed for acute anxiety/insomnia      . cholecalciferol (VITAMIN D) 1000 UNITS tablet Take 1,000 Units by mouth daily.      Marland Kitchen ibuprofen (ADVIL,MOTRIN) 200 MG tablet Take 400 mg by mouth every 6 (six) hours as needed. For headache      .  Multiple Vitamin (MULTIVITAMIN) tablet Take 1 tablet by mouth daily.        Marland Kitchen zolpidem (AMBIEN CR) 12.5 MG CR tablet Take 1 tablet by mouth as needed.      . tamoxifen (NOLVADEX) 20 MG tablet Take 1 tablet (20 mg total) by mouth daily.  30 tablet  0   No current  facility-administered medications for this visit.    PHYSICAL EXAMINATION: ECOG PERFORMANCE STATUS: 0 - Asymptomatic  Filed Vitals:   02/10/14 0919  BP: 143/79  Pulse: 78  Temp: 97.9 F (36.6 C)  Resp: 18   Filed Weights   02/10/14 0919  Weight: 123 lb 1.6 oz (55.838 kg)    GENERAL:alert, no distress and comfortable SKIN: skin color, texture, turgor are normal, no rashes or significant lesions EYES: normal, Conjunctiva are pink and non-injected, sclera clear OROPHARYNX:no exudate, no erythema and lips, buccal mucosa, and tongue normal  NECK: supple, thyroid normal size, non-tender, without nodularity LYMPH:  no palpable lymphadenopathy in the cervical, axillary or inguinal LUNGS: clear to auscultation and percussion with normal breathing effort HEART: regular rate & rhythm and no murmurs and no lower extremity edema ABDOMEN:abdomen soft, non-tender and normal bowel sounds Musculoskeletal:no cyanosis of digits and no clubbing  NEURO: alert & oriented x 3 with fluent speech, no focal motor/sensory deficits    LABORATORY DATA:  I have reviewed the data as listed   Chemistry      Component Value Date/Time   NA 140 01/18/2014 1252   NA 140 07/27/2013 1621   K 3.9 01/18/2014 1252   K 3.6 07/27/2013 1621   CL 106 07/27/2013 1621   CO2 25 01/18/2014 1252   CO2 26 07/27/2013 1621   BUN 12.3 01/18/2014 1252   BUN 12 07/27/2013 1621   CREATININE 0.7 01/18/2014 1252   CREATININE 0.52 07/27/2013 1621   CREATININE 0.62 03/15/2012 1032      Component Value Date/Time   CALCIUM 9.2 01/18/2014 1252   CALCIUM 9.5 07/27/2013 1621   ALKPHOS 56 01/18/2014 1252   ALKPHOS 50 07/27/2013 1621   AST 22 01/18/2014 1252   AST 20 07/27/2013 1621   ALT 26 01/18/2014 1252   ALT 22 07/27/2013 1621   BILITOT 0.50 01/18/2014 1252   BILITOT 0.4 07/27/2013 1621       Lab Results  Component Value Date   WBC 6.0 01/18/2014   HGB 13.2 01/18/2014   HCT 40.5 01/18/2014   MCV 93.1 01/18/2014   PLT 252 01/18/2014   NEUTROABS  4.1 01/18/2014     RADIOGRAPHIC STUDIES: I have personally reviewed the radiology reports and agreed with their findings. No results found.   ASSESSMENT & PLAN:  Bilateral breast cancer Bilateral breast cancers: Right breast: Invasive ductal carcinoma T1 C. N0 M0 stage IA clinical staging ER PR positive HER-2 negative Ki-67 10% Left breast: Invasive ductal carcinoma T1 B. N0 M0 clinical stage IA ER/PR positive HER-2 negative Ki-67 5% Patient wanted to know what is appropriate treatment of her bilateral breast cancers. The right breast periareolar area it appears to be involved based on MRI. However it could not be biopsied even though it was attempted. I recommended to the patient that she needs to consider doing bilateral mastectomies followed by reconstruction. We would send the final pathology based on lymph node status the Oncotype DX testing to determine whether she needs chemotherapy. If she gets bilateral mastectomies with negative margins, she does not need radiation therapy. She would then only require antiestrogen therapy  with tamoxifen.  After spending extensive amount of time, patient's husband requested referral to [facility] for a second opinion. We will make the referral according to their wishes.  Because interval of time that it is taking to come up with a definitive treatment, I recommended that she stop antiestrogen therapy with tamoxifen.We discussed the risks and benefits of tamoxifen. These include but not limited to insomnia, hot flashes, mood changes, vaginal dryness, and weight gain. Although rare, serious side effects including endometrial cancer, risk of blood clots were also discussed. We strongly believe that the benefits far outweigh the risks. Patient understands these risks and consented to starting treatment.  We're happy to see her if she decides to get her care with [place]. We will await their decision and be happy to assist in the transition to [facility] if that is what they choose.    Orders Placed This Encounter  Procedures  . NM PET Image Initial (PI) Skull Base To Thigh    Standing Status: Future     Number of Occurrences:      Standing Expiration Date: 02/10/2015    Order Specific Question:  Reason for Exam (SYMPTOM  OR DIAGNOSIS REQUIRED)    Answer:  Bilateral breast cancers; Initial staging    Order Specific Question:  Is the patient pregnant?    Answer:  No    Order Specific Question:  Preferred imaging location?    Answer:  [facility]   The patient has a good understanding of the overall plan. she agrees with it. She will call with any problems that may develop before her next visit here.  I spent 30 minutes counseling the patient face to face. The total time spent in the appointment was 40 minutes and more than 50% was on counseling and review of test results    Rulon Eisenmenger, MD 02/10/2014 12:24 PM

## 2014-02-10 NOTE — Assessment & Plan Note (Signed)
Bilateral breast cancers: Right breast: Invasive ductal carcinoma T1 C. N0 M0 stage IA clinical staging ER PR positive HER-2 negative Ki-67 10% Left breast: Invasive ductal carcinoma T1 B. N0 M0 clinical stage IA ER/PR positive HER-2 negative Ki-67 5% Patient wanted to know what is appropriate treatment of her bilateral breast cancers. The right breast periareolar area it appears to be involved based on MRI. However it could not be biopsied even though it was attempted. I recommended to the patient that she needs to consider doing bilateral mastectomies followed by reconstruction. We would send the final pathology based on lymph node status the Oncotype DX testing to determine whether she needs chemotherapy. If she gets bilateral mastectomies with negative margins, she does not need radiation therapy. She would then only require antiestrogen therapy with tamoxifen.  After spending extensive amount of time, patient's husband requested referral to [facility] for a second opinion. We will make the referral according to their wishes.  Because interval of time that it is taking to come up with a definitive treatment, I recommended that she stop antiestrogen therapy with tamoxifen.We discussed the risks and benefits of tamoxifen. These include but not limited to insomnia, hot flashes, mood changes, vaginal dryness, and weight gain. Although rare, serious side effects including endometrial cancer, risk of blood clots were also discussed. We strongly believe that the benefits far outweigh the risks. Patient understands these risks and consented to starting treatment.  We're happy to see her if she decides to get her care with [place]. We will await their decision and be happy to assist in the transition to [facility] if that is what they choose.

## 2014-02-13 ENCOUNTER — Encounter (HOSPITAL_COMMUNITY): Payer: Self-pay

## 2014-02-13 ENCOUNTER — Ambulatory Visit (HOSPITAL_COMMUNITY)
Admission: RE | Admit: 2014-02-13 | Discharge: 2014-02-13 | Disposition: A | Discharge status: Home / Self Care | Payer: BC Managed Care – PPO | Source: Ambulatory Visit | Attending: Diagnostic Radiology | Admitting: Diagnostic Radiology

## 2014-02-13 DIAGNOSIS — C50411 Malignant neoplasm of upper-outer quadrant of right female breast: Secondary | ICD-10-CM

## 2014-02-13 DIAGNOSIS — C50419 Malignant neoplasm of upper-outer quadrant of unspecified female breast: Secondary | ICD-10-CM | POA: Diagnosis present

## 2014-02-13 LAB — GLUCOSE, CAPILLARY: Glucose-Capillary: 87 mg/dL (ref 70–99)

## 2014-02-13 MED ORDER — FLUDEOXYGLUCOSE F - 18 (FDG) INJECTION
7.9000 | Freq: Once | INTRAVENOUS | Status: AC | PRN
  Administered 2014-02-13: 7.3 via INTRAVENOUS

## 2014-02-20 ENCOUNTER — Encounter: Payer: Self-pay | Admitting: Genetic Counselor

## 2014-02-20 NOTE — Progress Notes (Signed)
GENETIC TEST RESULTS  Patient Name: Brandy [name] Patient Age: 53 y.o. Encounter Date: 02/20/2014  Referring Physician: Nicholas Lose, MD   Ms. [name] was called today to discuss genetic test results. Please see the Genetics note from her visit on 01/25/14 for a detailed discussion of her personal and family history.  GENETIC TESTING: At the time of [name] visit, we recommended she pursue genetic testing of multiple genes on the BreastNext gene panel. BRCA1/BRCA2 testing was ordered first due to potential use of this for surgical decision-making. Those results were negative and given to [name] on 02/06/14. The BreastNext test, which included sequencing and deletion/duplication analysis of 17 genes, was performed at [facility]. Testing was normal and did not reveal a mutation in these genes. The genes tested were ATM, BARD1, BRCA1, BRCA2, BRIP1, CDH1, CHEK2, MRE11A, MUTYH, NBN, NF1, PALB2, PTEN, RAD50, RAD51C, RAD51D, and TP53.  We discussed with [name] that since the current test is not perfect, it is possible there may be a gene mutation that current testing cannot detect, but that chance is small. We also discussed that it is possible that a different genetic factor, which was not part of this testing or has not yet been discovered, is responsible for the cancer diagnoses in the family. Again, this is of low likelihood given her family history.  CANCER SCREENING: This result suggests that [name] bilateral breast cancer was most likely not due to an inherited predisposition. Most cancers happen by chance and this negative test, along with details of her family history, suggests that her cancer falls into this category. We, therefore, recommended she continue to follow the cancer screening guidelines provided by her physician.   FAMILY MEMBERS: Women in the family are at some increased risk of developing breast cancer, over the general population risk, simply due to the family  history. We recommended they have a yearly mammogram beginning at age 22, a yearly clinical breast exam, and perform monthly breast self-exams. A gynecologic exam is recommended yearly. Colon cancer screening is recommended to begin by age 2.  Lastly, we discussed with [name] that cancer genetics is a rapidly advancing field and it is possible that new genetic tests will be appropriate for her in the future. We encouraged her to remain in contact with [place] on an annual basis so we can update her personal and family histories, and let her know of advances in cancer genetics that may benefit the family. Our contact number was provided. [name] questions were answered to her satisfaction today, and she knows she is welcome to call anytime with additional questions.    Steele Berg, MS, [place] Certified Genetic Counseor phone: [phone] [email or web address]

## 2014-02-21 ENCOUNTER — Telehealth: Payer: Self-pay | Admitting: *Deleted

## 2014-02-21 NOTE — Telephone Encounter (Signed)
Received call from patient stating she would like a second opinion from Dr. Donne Hazel regarding her surgery.  She is very indecisive about her surgery and what she wants to do.  She has seen the team multiple times and has seen Dr. Harlow Mares and Dr. Migdalia Dk.  Informed her that she has a wonderful team and to let me know when she has made her decision.  There was also some discussion about a second opinion at [facility], but patient states that it is her husband wanting this and she would like to stay here.

## 2014-02-21 NOTE — Telephone Encounter (Signed)
Received patient phone message.  Returned patient's call and left message for a return phone call.

## 2014-02-23 ENCOUNTER — Telehealth: Payer: Self-pay | Admitting: *Deleted

## 2014-02-23 NOTE — Telephone Encounter (Signed)
Patient called requesting copy of pet scan. Faxed copy to patient.

## 2014-02-24 ENCOUNTER — Telehealth: Payer: Self-pay

## 2014-02-24 NOTE — Telephone Encounter (Signed)
Msg rcvd - sent inbasket to navigators.

## 2014-03-02 ENCOUNTER — Telehealth: Payer: Self-pay | Admitting: *Deleted

## 2014-03-02 NOTE — Telephone Encounter (Signed)
Spoke with patient to discuss her decisions regarding surgery/treatment.  Patient states she is so confused and does not know what to do.  After a  lengthy discussion, I think she has to decide to move forward with surgery and reconstruction. She does not want to go for a second opinion at [facility].  I informed her that I would also have one of our survivors that went through similar surgery to give her a call. She states that would be helpful.  I informed her I would also call Dr. Ethlyn Gallery nurse Sharyn Lull to let her know.  Encouraged her to call with any needs or concerns.

## 2014-03-02 NOTE — Telephone Encounter (Signed)
Left message for a return phone call to find out if she has made any decisions regarding her surgery/treatment/2nd opinion.  Awaiting patient response.

## 2014-03-10 ENCOUNTER — Telehealth: Payer: Self-pay | Admitting: *Deleted

## 2014-03-10 NOTE — Telephone Encounter (Signed)
Left message for a return phone call.  Awaiting patient response. 

## 2014-03-13 ENCOUNTER — Telehealth: Payer: Self-pay | Admitting: *Deleted

## 2014-03-13 NOTE — Telephone Encounter (Signed)
Left message for a return phone call to check on her and see if she has made some decisions about her treatment.

## 2014-03-14 ENCOUNTER — Other Ambulatory Visit: Payer: Self-pay | Admitting: Hematology and Oncology

## 2014-03-16 ENCOUNTER — Telehealth: Payer: Self-pay | Admitting: *Deleted

## 2014-03-16 NOTE — Telephone Encounter (Signed)
Received call from patient's daughter stating she would like to know when surgery could be scheduled for her mom.  Informed her that I have left several messages for her mom as well as Brandy [name] with Dr. Ethlyn Gallery office about moving forward with her surgery.  She states she has had several conversations with her mom trying to get her to schedule her surgery.  Brandy [name] did go to [place] but has decided to have her surgery here. She stated that her mom called [facility] yesterday to get things started.  Informed her I would call Brandy [name] at [facility] to see if we can expedite surgery.  Encouraged her to call with any questions or concerns and to have her mom call if she needs anything.

## 2014-03-20 ENCOUNTER — Encounter (HOSPITAL_COMMUNITY): Payer: Self-pay

## 2014-03-21 ENCOUNTER — Telehealth: Payer: Self-pay

## 2014-03-21 ENCOUNTER — Telehealth: Payer: Self-pay | Admitting: *Deleted

## 2014-03-21 ENCOUNTER — Other Ambulatory Visit: Payer: Self-pay | Admitting: *Deleted

## 2014-03-21 NOTE — Telephone Encounter (Signed)
Left message once again to try to contact patient.  She is not returning my phone calls.  [facility] has made several attempts as well to try to get her surgery scheduled.  Will try again later.

## 2014-03-21 NOTE — Telephone Encounter (Signed)
Returned pt call - tamoxifen should not stop periods.  Pt reports she has started spotting today.  Pt had no follow up appt made.  11/12 at 345 pm.  pof sent

## 2014-03-22 ENCOUNTER — Telehealth: Payer: Self-pay | Admitting: Hematology and Oncology

## 2014-03-22 NOTE — Telephone Encounter (Signed)
Lm to confimr d/t for 03/30/14 appt.

## 2014-03-27 ENCOUNTER — Telehealth: Payer: Self-pay | Admitting: *Deleted

## 2014-03-27 NOTE — Telephone Encounter (Signed)
Attempted to call patient and was unable to leave message due to full mailbox.  Will try again later.  I will also attempt to call her daughter Ria Comment.

## 2014-03-29 ENCOUNTER — Other Ambulatory Visit: Payer: Self-pay | Admitting: *Deleted

## 2014-03-29 DIAGNOSIS — C50911 Malignant neoplasm of unspecified site of right female breast: Secondary | ICD-10-CM

## 2014-03-29 DIAGNOSIS — C50912 Malignant neoplasm of unspecified site of left female breast: Principal | ICD-10-CM

## 2014-03-30 ENCOUNTER — Telehealth: Payer: Self-pay | Admitting: Hematology and Oncology

## 2014-03-30 ENCOUNTER — Other Ambulatory Visit (HOSPITAL_BASED_OUTPATIENT_CLINIC_OR_DEPARTMENT_OTHER): Payer: BC Managed Care – PPO

## 2014-03-30 ENCOUNTER — Ambulatory Visit (HOSPITAL_BASED_OUTPATIENT_CLINIC_OR_DEPARTMENT_OTHER): Payer: BC Managed Care – PPO | Admitting: Hematology and Oncology

## 2014-03-30 VITALS — BP 148/84 | HR 84 | Temp 98.3°F | Resp 18 | Ht 65.0 in | Wt 123.3 lb

## 2014-03-30 DIAGNOSIS — C50812 Malignant neoplasm of overlapping sites of left female breast: Secondary | ICD-10-CM

## 2014-03-30 DIAGNOSIS — C50811 Malignant neoplasm of overlapping sites of right female breast: Secondary | ICD-10-CM

## 2014-03-30 DIAGNOSIS — C50912 Malignant neoplasm of unspecified site of left female breast: Principal | ICD-10-CM

## 2014-03-30 DIAGNOSIS — C50911 Malignant neoplasm of unspecified site of right female breast: Secondary | ICD-10-CM

## 2014-03-30 DIAGNOSIS — Z79810 Long term (current) use of selective estrogen receptor modulators (SERMs): Secondary | ICD-10-CM

## 2014-03-30 DIAGNOSIS — Z9010 Acquired absence of unspecified breast and nipple: Secondary | ICD-10-CM

## 2014-03-30 LAB — CBC WITH DIFFERENTIAL/PLATELET
BASO%: 0.8 % (ref 0.0–2.0)
Basophils Absolute: 0 10*3/uL (ref 0.0–0.1)
EOS%: 0.7 % (ref 0.0–7.0)
Eosinophils Absolute: 0 10*3/uL (ref 0.0–0.5)
HCT: 41.4 % (ref 34.8–46.6)
HGB: 13.5 g/dL (ref 11.6–15.9)
LYMPH%: 28.4 % (ref 14.0–49.7)
MCH: 30.1 pg (ref 25.1–34.0)
MCHC: 32.6 g/dL (ref 31.5–36.0)
MCV: 92.3 fL (ref 79.5–101.0)
MONO#: 0.3 10*3/uL (ref 0.1–0.9)
MONO%: 5.1 % (ref 0.0–14.0)
NEUT#: 3.7 10*3/uL (ref 1.5–6.5)
NEUT%: 65 % (ref 38.4–76.8)
Platelets: 261 10*3/uL (ref 145–400)
RBC: 4.49 10*6/uL (ref 3.70–5.45)
RDW: 12.6 % (ref 11.2–14.5)
WBC: 5.7 10*3/uL (ref 3.9–10.3)
lymph#: 1.6 10*3/uL (ref 0.9–3.3)

## 2014-03-30 LAB — COMPREHENSIVE METABOLIC PANEL (CC13)
ALT: 12 U/L (ref 0–55)
AST: 16 U/L (ref 5–34)
Albumin: 4.1 g/dL (ref 3.5–5.0)
Alkaline Phosphatase: 49 U/L (ref 40–150)
Anion Gap: 7 mEq/L (ref 3–11)
BUN: 12 mg/dL (ref 7.0–26.0)
CO2: 28 mEq/L (ref 22–29)
Calcium: 9.5 mg/dL (ref 8.4–10.4)
Chloride: 108 mEq/L (ref 98–109)
Creatinine: 0.8 mg/dL (ref 0.6–1.1)
Glucose: 92 mg/dl (ref 70–140)
Potassium: 3.6 mEq/L (ref 3.5–5.1)
Sodium: 143 mEq/L (ref 136–145)
Total Bilirubin: 0.53 mg/dL (ref 0.20–1.20)
Total Protein: 6.9 g/dL (ref 6.4–8.3)

## 2014-03-30 MED ORDER — TAMOXIFEN CITRATE 20 MG PO TABS: 20.0000 mg | ORAL_TABLET | Freq: Every day | ORAL | Status: DC

## 2014-03-30 NOTE — Telephone Encounter (Signed)
Pt confirmed MD visit per 11/12 POF, gave pt AVS..... KJ

## 2014-03-30 NOTE — Progress Notes (Signed)
Patient Care Team: Sheela Stack, MD as PCP - General (Endocrinology) [name], MD as Consulting Physician (Gynecology) Autumn Messing III, MD as Consulting Physician (General Surgery) Rulon Eisenmenger, MD as Consulting Physician (Hematology and Oncology)  DIAGNOSIS: Bilateral breast cancer   Staging form: Breast, AJCC 7th Edition     Clinical: Stage IA (T1c, N0, cM0) - Unsigned       Staging comments: Staged at breast conference on 01/18/14.      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   01/20/2014 Procedure Left breast 6:00 position biopsy: IDC with DCIS ER 100%, PR 45%, Ki-67 5%, HER-2 negative ratio 1.13; clinical T1 B. N0 M0 stage IA   02/06/2014 Procedure BRCA1 and 2 are negative, Breast Next panel pending    CHIEF COMPLIANT: followup on tamoxifen neoadjuvant  INTERVAL HISTORY: Brandy [name] is a 53 year old Caucasian with above-mentioned history of bilateral breast cancers who I recommended bilateral mastectomies and reconstruction. But she decided to get a second opinion at [facility] [place]. She is planning to undergo surgery on December 9. She is concerned with the delay in the surgery date and appointment. And she wanted to know if being on tamoxifen with her doctor up until that time. She denies any new complaints or concerns. She has  been tolerating tamoxifen extremely well.  REVIEW OF SYSTEMS:   Constitutional: Denies fevers, chills or abnormal weight loss Eyes: Denies blurriness of vision Ears, nose, mouth, throat, and face: Denies mucositis or sore throat Respiratory: Denies cough, dyspnea or wheezes Cardiovascular: Denies palpitation, chest discomfort or lower extremity swelling Gastrointestinal:  Denies nausea, heartburn or change in bowel habits Skin: Denies abnormal skin rashes Lymphatics: Denies new lymphadenopathy or easy bruising Neurological:Denies numbness, tingling or new weaknesses Behavioral/Psych: Mood is stable, no new changes  Breast:  denies any pain or lumps or nodules in either breasts All other systems were reviewed with the patient and are negative.  I have reviewed the past medical history, past surgical history, social history and family history with the patient and they are unchanged from previous note.  ALLERGIES:  has No Known Allergies.  MEDICATIONS:  Current Outpatient Prescriptions  Medication Sig Dispense Refill  . ALPRAZolam (XANAX) 0.5 MG tablet Take 1/2 to 1 tablet daily as needed for acute anxiety/insomnia    . cholecalciferol (VITAMIN D) 1000 UNITS tablet Take 1,000 Units by mouth daily.    Marland Kitchen ibuprofen (ADVIL,MOTRIN) 200 MG tablet Take 400 mg by mouth every 6 (six) hours as needed. For headache    . Multiple Vitamin (MULTIVITAMIN) tablet Take 1 tablet by mouth daily.      . tamoxifen (NOLVADEX) 20 MG tablet Take 1 tablet (20 mg total) by mouth daily. 90 tablet 3  . zolpidem (AMBIEN CR) 12.5 MG CR tablet Take 1 tablet by mouth as needed.     No current  facility-administered medications for this visit.    PHYSICAL EXAMINATION: ECOG PERFORMANCE STATUS: 0 - Asymptomatic  Filed Vitals:   03/30/14 1526  BP: 148/84  Pulse: 84  Temp: 98.3 F (36.8 C)  Resp: 18   Filed Weights   03/30/14 1526  Weight: 123 lb 4.8 oz (55.929 kg)    GENERAL:alert, no distress and  comfortable SKIN: skin color, texture, turgor are normal, no rashes or significant lesions EYES: normal, Conjunctiva are pink and non-injected, sclera clear OROPHARYNX:no exudate, no erythema and lips, buccal mucosa, and tongue normal  NECK: supple, thyroid normal size, non-tender, without nodularity LYMPH:  no palpable lymphadenopathy in the cervical, axillary or inguinal LUNGS: clear to auscultation and percussion with normal breathing effort HEART: regular rate & rhythm and no murmurs and no lower extremity edema ABDOMEN:abdomen soft, non-tender and normal bowel sounds Musculoskeletal:no cyanosis of digits and no clubbing  NEURO: alert & oriented x 3 with fluent speech, no focal motor/sensory deficits  LABORATORY DATA:  I have reviewed the data as listed   Chemistry      Component Value Date/Time   NA 143 03/30/2014 1514   NA 140 07/27/2013 1621   K 3.6 03/30/2014 1514   K 3.6 07/27/2013 1621   CL 106 07/27/2013 1621   CO2 28 03/30/2014 1514   CO2 26 07/27/2013 1621   BUN 12.0 03/30/2014 1514   BUN 12 07/27/2013 1621   CREATININE 0.8 03/30/2014 1514   CREATININE 0.52 07/27/2013 1621   CREATININE 0.62 03/15/2012 1032      Component Value Date/Time   CALCIUM 9.5 03/30/2014 1514   CALCIUM 9.5 07/27/2013 1621   ALKPHOS 49 03/30/2014 1514   ALKPHOS 50 07/27/2013 1621   AST 16 03/30/2014 1514   AST 20 07/27/2013 1621   ALT 12 03/30/2014 1514   ALT 22 07/27/2013 1621   BILITOT 0.53 03/30/2014 1514   BILITOT 0.4 07/27/2013 1621       Lab Results  Component Value Date   WBC 5.7 03/30/2014   HGB 13.5 03/30/2014   HCT 41.4 03/30/2014   MCV 92.3 03/30/2014   PLT 261 03/30/2014   NEUTROABS 3.7 03/30/2014     RADIOGRAPHIC STUDIES: I have personally reviewed the radiology reports and agreed with their findings. No results found.   ASSESSMENT & PLAN:  Bilateral breast cancer Bilateral breast cancers: Right breast: Invasive ductal carcinoma T1 C. N0 M0 stage IA  clinical staging ER PR positive HER-2 negative Ki-67 10% Left breast: Invasive ductal carcinoma T1 B. N0 M0 clinical stage IA ER/PR positive HER-2 negative Ki-67 5%  Patient has had delays in surgery primarily because she wanted second opinion [place]. Apparently her surgery has been arranged for 04/26/2014. She is very concerned about the delay. Apparently there has been a delay because of arranging plastic surgery and general surgery schedule. She is still trying to figure out if an earlier date may be available.  In the interim, she is currently on tamoxifen and appears to be tolerating it extremely well without any major problems. I instructed her to stop tamoxifen 2-3 days prior to surgery.  I would like to see her back after surgery to discuss the final pathology and to determine if Oncotype DX testing should be sent on this final specimen. [facility] breast navigator's have been trying to get in touch with her but she had not reply for a very long time and I believe that the delay in treatment is primarily due to patient factors. However  I provided her reassurance that being on tamoxifen should keep her breast cancer in a control.    No orders of the defined types were placed in this encounter.   The patient has a good understanding of the overall plan. she agrees with it. She will call with any problems that may develop before her next visit here.  I spent 20 minutes counseling the patient face to face. The total time spent in the appointment was 25 minutes and more than 50% was on counseling and review of test results    Rulon Eisenmenger, MD 03/30/2014 5:17 PM

## 2014-03-30 NOTE — Assessment & Plan Note (Signed)
Bilateral breast cancers: Right breast: Invasive ductal carcinoma T1 C. N0 M0 stage IA clinical staging ER PR positive HER-2 negative Ki-67 10% Left breast: Invasive ductal carcinoma T1 B. N0 M0 clinical stage IA ER/PR positive HER-2 negative Ki-67 5%  Patient has had delays in surgery primarily because she wanted second opinion [place]. Apparently her surgery has been arranged for 04/26/2014. She is very concerned about the delay. Apparently there has been a delay because of arranging plastic surgery and general surgery schedule. She is still trying to figure out if an earlier date may be available.  In the interim, she is currently on tamoxifen and appears to be tolerating it extremely well without any major problems. I instructed her to stop tamoxifen 2-3 days prior to surgery.  I would like to see her back after surgery to discuss the final pathology and to determine if Oncotype DX testing should be sent on this final specimen. [facility] breast navigator's have been trying to get in touch with her but she had not reply for a very long time and I believe that the delay in treatment is primarily due to patient factors. However I provided her reassurance that being on tamoxifen should keep her breast cancer in a control.

## 2014-04-11 ENCOUNTER — Ambulatory Visit: Payer: BC Managed Care – PPO | Admitting: General Surgery

## 2014-05-04 ENCOUNTER — Ambulatory Visit: Payer: BC Managed Care – PPO | Admitting: Hematology and Oncology

## 2014-05-05 ENCOUNTER — Telehealth: Payer: Self-pay

## 2014-05-05 NOTE — Telephone Encounter (Signed)
Returned pt call re: rescheduling appt d/t surgery.  Let pt know I would send scheduling request for 7-10 days and she should hear from scheduling next week with new appt d/t.  Pt voiced understanding.  POF sent

## 2014-05-08 ENCOUNTER — Telehealth: Payer: Self-pay | Admitting: Hematology and Oncology

## 2014-05-08 NOTE — Telephone Encounter (Signed)
, °

## 2014-05-10 ENCOUNTER — Encounter (HOSPITAL_COMMUNITY): Payer: Self-pay

## 2014-05-10 ENCOUNTER — Emergency Department (HOSPITAL_COMMUNITY)
Admission: EM | Admit: 2014-05-10 | Discharge: 2014-05-10 | Disposition: A | Discharge status: Home / Self Care | Payer: BC Managed Care – PPO | Attending: Emergency Medicine | Admitting: Emergency Medicine

## 2014-05-10 DIAGNOSIS — Z8639 Personal history of other endocrine, nutritional and metabolic disease: Secondary | ICD-10-CM | POA: Diagnosis not present

## 2014-05-10 DIAGNOSIS — J45909 Unspecified asthma, uncomplicated: Secondary | ICD-10-CM | POA: Insufficient documentation

## 2014-05-10 DIAGNOSIS — Z8742 Personal history of other diseases of the female genital tract: Secondary | ICD-10-CM | POA: Insufficient documentation

## 2014-05-10 DIAGNOSIS — K644 Residual hemorrhoidal skin tags: Secondary | ICD-10-CM | POA: Insufficient documentation

## 2014-05-10 DIAGNOSIS — Z8679 Personal history of other diseases of the circulatory system: Secondary | ICD-10-CM | POA: Insufficient documentation

## 2014-05-10 DIAGNOSIS — T402X5A Adverse effect of other opioids, initial encounter: Secondary | ICD-10-CM | POA: Diagnosis not present

## 2014-05-10 DIAGNOSIS — Z9010 Acquired absence of unspecified breast and nipple: Secondary | ICD-10-CM | POA: Diagnosis not present

## 2014-05-10 DIAGNOSIS — K5900 Constipation, unspecified: Secondary | ICD-10-CM | POA: Diagnosis present

## 2014-05-10 DIAGNOSIS — K5909 Other constipation: Secondary | ICD-10-CM | POA: Diagnosis not present

## 2014-05-10 DIAGNOSIS — Z79818 Long term (current) use of other agents affecting estrogen receptors and estrogen levels: Secondary | ICD-10-CM | POA: Diagnosis not present

## 2014-05-10 DIAGNOSIS — K5903 Drug induced constipation: Secondary | ICD-10-CM

## 2014-05-10 LAB — BASIC METABOLIC PANEL
Anion gap: 11 (ref 5–15)
BUN: <5 mg/dL — ABNORMAL LOW (ref 6–23)
CO2: 23 mmol/L (ref 19–32)
Calcium: 9.5 mg/dL (ref 8.4–10.5)
Chloride: 106 mEq/L (ref 96–112)
Creatinine, Ser: 0.6 mg/dL (ref 0.50–1.10)
GFR calc Af Amer: >90 mL/min (ref >90)
GFR calc non Af Amer: >90 mL/min (ref >90)
Glucose, Bld: 107 mg/dL — ABNORMAL HIGH (ref 70–99)
Potassium: 3.5 mmol/L (ref 3.5–5.1)
Sodium: 140 mmol/L (ref 135–145)

## 2014-05-10 LAB — CBC WITH DIFFERENTIAL/PLATELET
Basophils Absolute: 0 10*3/uL (ref 0.0–0.1)
Basophils Relative: 0 % (ref 0–1)
Eosinophils Absolute: 0.1 10*3/uL (ref 0.0–0.7)
Eosinophils Relative: 1 % (ref 0–5)
HCT: 33.5 % — ABNORMAL LOW (ref 36.0–46.0)
Hemoglobin: 11.4 g/dL — ABNORMAL LOW (ref 12.0–15.0)
Lymphocytes Relative: 16 % (ref 12–46)
Lymphs Abs: 1.4 10*3/uL (ref 0.7–4.0)
MCH: 31.4 pg (ref 26.0–34.0)
MCHC: 34 g/dL (ref 30.0–36.0)
MCV: 92.3 fL (ref 78.0–100.0)
Monocytes Absolute: 0.4 10*3/uL (ref 0.1–1.0)
Monocytes Relative: 5 % (ref 3–12)
Neutro Abs: 7.1 10*3/uL (ref 1.7–7.7)
Neutrophils Relative %: 78 % — ABNORMAL HIGH (ref 43–77)
Platelets: 476 10*3/uL — ABNORMAL HIGH (ref 150–400)
RBC: 3.63 MIL/uL — ABNORMAL LOW (ref 3.87–5.11)
RDW: 13.3 % (ref 11.5–15.5)
WBC: 9 10*3/uL (ref 4.0–10.5)

## 2014-05-10 MED ORDER — FLEET ENEMA 7-19 GM/118ML RE ENEM
1.0000 | ENEMA | Freq: Once | RECTAL | Status: AC
  Administered 2014-05-10: 1 via RECTAL
  Filled 2014-05-10: qty 1

## 2014-05-10 MED ORDER — ONDANSETRON HCL 4 MG/2ML IJ SOLN
4.0000 mg | Freq: Once | INTRAMUSCULAR | Status: DC
  Filled 2014-05-10: qty 2

## 2014-05-10 MED ORDER — SODIUM CHLORIDE 0.9 % IV BOLUS (SEPSIS)
1000.0000 mL | Freq: Once | INTRAVENOUS | Status: AC
  Administered 2014-05-10: 1000 mL via INTRAVENOUS

## 2014-05-10 MED ORDER — LORAZEPAM 2 MG/ML IJ SOLN
1.0000 mg | Freq: Once | INTRAMUSCULAR | Status: AC
  Administered 2014-05-10: 1 mg via INTRAVENOUS
  Filled 2014-05-10: qty 1

## 2014-05-10 MED ORDER — PROMETHAZINE HCL 25 MG/ML IJ SOLN
12.5000 mg | Freq: Once | INTRAMUSCULAR | Status: AC
  Administered 2014-05-10: 12.5 mg via INTRAVENOUS
  Filled 2014-05-10: qty 1

## 2014-05-10 MED ORDER — PROMETHAZINE HCL 25 MG PO TABS: 12.5000 mg | ORAL_TABLET | Freq: Two times a day (BID) | ORAL | Status: DC

## 2014-05-10 MED ORDER — FENTANYL CITRATE 0.05 MG/ML IJ SOLN
50.0000 ug | Freq: Once | INTRAMUSCULAR | Status: AC
  Administered 2014-05-10: 50 ug via INTRAVENOUS
  Filled 2014-05-10: qty 2

## 2014-05-10 MED ORDER — PROMETHAZINE HCL 25 MG PO TABS: 25.0000 mg | ORAL_TABLET | Freq: Three times a day (TID) | ORAL | Status: DC | PRN

## 2014-05-10 MED ORDER — SENNOSIDES-DOCUSATE SODIUM 8.6-50 MG PO TABS: 1.0000 | ORAL_TABLET | Freq: Every day | ORAL | Status: DC

## 2014-05-10 MED ORDER — MAGNESIUM CITRATE PO SOLN
1.0000 | Freq: Once | ORAL | Status: AC
  Administered 2014-05-10: 1 via ORAL
  Filled 2014-05-10: qty 296

## 2014-05-10 MED ORDER — HYDROMORPHONE HCL 1 MG/ML IJ SOLN
0.5000 mg | Freq: Once | INTRAMUSCULAR | Status: AC
  Administered 2014-05-10: 0.5 mg via INTRAVENOUS
  Filled 2014-05-10: qty 1

## 2014-05-10 MED ORDER — SENNOSIDES-DOCUSATE SODIUM 8.6-50 MG PO TABS: 2.0000 | ORAL_TABLET | Freq: Every day | ORAL | Status: DC

## 2014-05-10 NOTE — ED Notes (Signed)
Pt remains on bedside commode. Pt also attempting to drink mag citrate

## 2014-05-10 NOTE — Discharge Instructions (Signed)
Follow-up with your primary care doctor in your surgeon who did your procedure. Continue using your MiraLax in addition to Senokot S2 tabs by mouth daily. Return to the ER if you develop any severe pain, high fever, nausea, vomiting, worsening of symptoms.  Constipation Constipation is when a person has fewer than three bowel movements a week, has difficulty having a bowel movement, or has stools that are dry, hard, or larger than normal. As people grow older, constipation is more common. If you try to fix constipation with medicines that make you have a bowel movement (laxatives), the problem may get worse. Long-term laxative use may cause the muscles of the colon to become weak. A low-fiber diet, not taking in enough fluids, and taking certain medicines may make constipation worse.  CAUSES   Certain medicines, such as antidepressants, pain medicine, iron supplements, antacids, and water pills.   Certain diseases, such as diabetes, irritable bowel syndrome (IBS), thyroid disease, or depression.   Not drinking enough water.   Not eating enough fiber-rich foods.   Stress or travel.   Lack of physical activity or exercise.   Ignoring the urge to have a bowel movement.   Using laxatives too much.  SIGNS AND SYMPTOMS   Having fewer than three bowel movements a week.   Straining to have a bowel movement.   Having stools that are hard, dry, or larger than normal.   Feeling full or bloated.   Pain in the lower abdomen.   Not feeling relief after having a bowel movement.  DIAGNOSIS  Your health care provider will take a medical history and perform a physical exam. Further testing may be done for severe constipation. Some tests may include:  A barium enema X-ray to examine your rectum, colon, and, sometimes, your small intestine.   A sigmoidoscopy to examine your lower colon.   A colonoscopy to examine your entire colon. TREATMENT  Treatment will depend on the  severity of your constipation and what is causing it. Some dietary treatments include drinking more fluids and eating more fiber-rich foods. Lifestyle treatments may include regular exercise. If these diet and lifestyle recommendations do not help, your health care provider may recommend taking over-the-counter laxative medicines to help you have bowel movements. Prescription medicines may be prescribed if over-the-counter medicines do not work.  HOME CARE INSTRUCTIONS   Eat foods that have a lot of fiber, such as fruits, vegetables, whole grains, and beans.  Limit foods high in fat and processed sugars, such as french fries, hamburgers, cookies, candies, and soda.   A fiber supplement may be added to your diet if you cannot get enough fiber from foods.   Drink enough fluids to keep your urine clear or pale yellow.   Exercise regularly or as directed by your health care provider.   Go to the restroom when you have the urge to go. Do not hold it.   Only take over-the-counter or prescription medicines as directed by your health care provider. Do not take other medicines for constipation without talking to your health care provider first.  [place] IF:   You have bright red blood in your stool.   Your constipation lasts for more than 4 days or gets worse.   You have abdominal or rectal pain.   You have thin, pencil-like stools.   You have unexplained weight loss. MAKE SURE YOU:   Understand these instructions.  Will watch your condition.  Will get help right away if you  are not doing well or get worse. Document Released: 02/01/2004 Document Revised: 05/10/2013 Document Reviewed: 02/14/2013 [facility] Patient Information 2015 [place], [place]. This information is not intended to replace advice given to you by your health care provider. Make sure you discuss any questions you have with your health care provider.

## 2014-05-10 NOTE — ED Notes (Signed)
Pt assisted to bedside commode attempting to have a bowel movement. Pt reports "it feels stuck, nothing is coming out." Pt requesting pain medication

## 2014-05-10 NOTE — ED Notes (Signed)
Pt with double mastectomy two weeks ago.  Sts she has not been able to have a bowel movement in 2 weeks. Has tried miralax, stool softeners and many different home remedies with no relief.  Sts "it feels like I am sitting on a ball it hurts so much".  Pain 10/10.  Pt uncomfortable in triage.

## 2014-05-10 NOTE — ED Provider Notes (Signed)
CSN: [id number]     Arrival date & time 05/10/14  1849 History   First MD Initiated Contact with Patient 05/10/14 1902     Chief Complaint  Patient presents with  . Constipation     (Consider location/radiation/quality/duration/timing/severity/associated sxs/prior Treatment) HPI Ms. Brandy [name] is a 53 year old female with past medical history of high cholesterol, hyperthyroidism, recent breast cancer with double mastectomy 2 weeks ago who presents the ER complaining of constipation. Patient reports since her procedure 2 weeks ago she has not had a bowel movement. Patient states she's been taking oxycodone for pain, and tried multiple over-the-counter therapies including Maalox, Dulcolax, topical lidocaine prescribed by her gastroenterologist with no relief. Patient states her pain has increased gradually in the past 2 weeks and has become severe over the past 24 hours. Patient denies associated nausea, vomiting, chest pain, shortness of breath, fever, dysuria.  Past Medical History  Diagnosis Date  . 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 High cholesterol   . Asthma     adult onset  . 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 Hyperthyroidism   . Breast cancer    Past Surgical History  Procedure Laterality Date  . No past surgeries    . Cardiac catheterization  2008   Family History  Problem Relation Age of Onset  . Heart disease Father 82  . Hypertension Father   . Diabetes Father   . Hypertension Sister   . Diabetes Sister   . Hypertension Brother   . Diabetes Brother   . Stroke Brother   . Breast cancer Maternal Aunt     40's  . Breast cancer Maternal Grandmother 80  . Cancer Maternal Grandfather     colon  . Heart disease Paternal Grandmother   . Diabetes Paternal Grandmother   . Heart disease Paternal Grandfather   . Diabetes Paternal Grandfather   . Breast cancer Maternal Aunt     40's  . Diabetes Mother   . Cancer Maternal Aunt      brain; ? primary; deceased 70   History  Substance Use Topics  . Smoking status: Never Smoker   . Smokeless tobacco: Never Used  . Alcohol Use: No   OB History    Gravida Para Term Preterm AB TAB SAB Ectopic Multiple Living   3 3 3       3      Review of Systems  Constitutional: Negative for fever.  HENT: Negative for trouble swallowing.   Eyes: Negative for visual disturbance.  Respiratory: Negative for shortness of breath.   Cardiovascular: Negative for chest pain.  Gastrointestinal: Positive for abdominal pain and constipation. Negative for nausea and vomiting.  Genitourinary: Negative for dysuria.  Musculoskeletal: Negative for neck pain.  Skin: Negative for rash.  Neurological: Negative for dizziness, weakness and numbness.  Psychiatric/Behavioral: Negative.       Allergies  Zofran  Home Medications   Prior to Admission medications   Medication Sig Start Date End Date Taking? Authorizing Provider  ALPRAZolam Duanne Moron) 0.5 MG tablet Take 0.5-1 mg by mouth 4 (four) times daily as needed for anxiety. Take 1/2 to 1 tablet daily as needed for acute anxiety/insomnia 09/09/11  Yes Historical Provider, MD  docusate sodium (COLACE) 100 MG capsule Take 100 mg by mouth daily as needed for mild constipation or moderate constipation.  04/28/14  Yes Historical Provider, MD  lidocaine-hydrocortisone (ANAMANTEL HC) 3-0.5 % CREA Apply 1 Applicatorful topically 2 (two) times daily.  05/10/14  Yes  Historical Provider, MD  ondansetron (ZOFRAN-ODT) 4 MG disintegrating tablet Take 4 mg by mouth every 8 (eight) hours as needed for nausea.  04/28/14  Yes Historical Provider, MD  oxyCODONE-acetaminophen (PERCOCET) 7.5-325 MG per tablet Take 1 tablet by mouth every 6 (six) hours as needed for pain.  05/02/14  Yes Historical Provider, MD  polyethylene glycol (MIRALAX / GLYCOLAX) packet Take 17 g by mouth daily as needed for mild constipation or moderate constipation.    Yes Historical Provider, MD   tamoxifen (NOLVADEX) 20 MG tablet Take 1 tablet (20 mg total) by mouth daily. 03/30/14  Yes Rulon Eisenmenger, MD  cholecalciferol (VITAMIN D) 1000 UNITS tablet Take 1,000 Units by mouth daily.    Historical Provider, MD  ibuprofen (ADVIL,MOTRIN) 200 MG tablet Take 400 mg by mouth every 6 (six) hours as needed. For headache    Historical Provider, MD  Multiple Vitamin (MULTIVITAMIN) tablet Take 1 tablet by mouth daily.      Historical Provider, MD  promethazine (PHENERGAN) 25 MG tablet Take 1 tablet (25 mg total) by mouth every 8 (eight) hours as needed for nausea or vomiting. 05/10/14   Carrie Mew, PA-C  senna-docusate (SENOKOT-S) 8.6-50 MG per tablet Take 2 tablets by mouth daily. 05/10/14   Carrie Mew, PA-C  zolpidem (AMBIEN CR) 12.5 MG CR tablet Take 1 tablet by mouth at bedtime as needed for sleep.  10/24/13   Historical Provider, MD   BP 132/72 mmHg  Pulse 98  Temp(Src) 99.9 F (37.7 C) (Oral)  Resp 14  SpO2 99% Physical Exam  Constitutional: She is oriented to person, place, and time. She appears well-developed and well-nourished.  In moderate amount of stress due to discomfort.  HENT:  Head: Normocephalic and atraumatic.  Mouth/Throat: Oropharynx is clear and moist. No oropharyngeal exudate.  Eyes: Right eye exhibits no discharge. Left eye exhibits no discharge. No scleral icterus.  Neck: Normal range of motion.  Cardiovascular: Normal rate, regular rhythm and normal heart sounds.   No murmur heard. Pulmonary/Chest: Effort normal and breath sounds normal. No respiratory distress.  Bilateral mastectomies with healing surgical wounds. Mild erythema and edema on right with drain placement still in place. No purulent discharge or signs of infection noted.  Abdominal: Soft. Normal appearance and bowel sounds are normal. There is generalized tenderness. There is no rigidity, no guarding, no tenderness at McBurney's point and negative Murphy's sign.  Genitourinary: Rectal exam shows  external hemorrhoid. Rectal exam shows no internal hemorrhoid, no fissure, no mass, no tenderness and anal tone normal.  Large amount of stool noted in rectal vault, stool is soft, no obvious fecal impaction. Chaperone present during entire rectal exam.  Musculoskeletal: Normal range of motion. She exhibits no edema or tenderness.  Neurological: She is alert and oriented to person, place, and time. No cranial nerve deficit. Coordination normal.  Skin: Skin is warm and dry. No rash noted. She is not diaphoretic.  Psychiatric: She has a normal mood and affect.  Nursing note and vitals reviewed.   ED Course  Procedures (including critical care time) Labs Review Labs Reviewed  CBC WITH DIFFERENTIAL - Abnormal; Notable for the following:    RBC 3.63 (*)    Hemoglobin 11.4 (*)    HCT 33.5 (*)    Platelets 476 (*)    Neutrophils Relative % 78 (*)    All other components within normal limits  BASIC METABOLIC PANEL - Abnormal; Notable for the following:    Glucose, Bld 107 (*)  BUN <5 (*)    All other components within normal limits    Imaging Review No results found.   EKG Interpretation None      MDM   Final diagnoses:  Constipation due to pain medication    Patient here complains of 2 weeks of constipation. Patient having generalized abdominal pain from same. Patient appears extremely uncomfortable on exam. Rectal exam significant for a large amount of stool noted in patient's rectal vault. Will treat with Fleet enema, and reassess patient's pain after bowel movement.  Patient unable to tolerate Fleet enema. We will attempt using magnesium citrate, and symptomatically therapy for patient pain. Lab work unremarkable with no evidence of acute pathology.  Patient having multiple bowel movements after magnesium citrate, pain and nausea under control after fluids, pain medication and Phenergan. After bowel movements patient is well-appearing, nontoxic tachycardic, nontachypneic,  non-hypoxic and in no acute distress. On reexamination of her abdomen there is no tenderness. Patient stating she is feeling much more comfortable and is asymptomatic. We will discharge patient at this time with symptomatically therapy and with bowel regimen. Prescribe patient with Senokot-S and encourage her to continue taking MiraLAX at home with her senna. I strongly encouraged her to follow-up with her primary care physician regarding this episode and to follow-up with her surgeon who did her procedure. I discussed return precautions with patient and patient was agreeable to this plan. I encouraged patient to call or return to the ER should she have any questions or concerns.  BP 132/72 mmHg  Pulse 98  Temp(Src) 99.9 F (37.7 C) (Oral)  Resp 14  SpO2 99%  Signed,  Dahlia Bailiff, PA-C 11:45 PM  Patient discussed with Dr. Carmin Muskrat, MD.   Carrie Mew, PA-C 05/10/14 [place]  Carmin Muskrat, MD 05/10/14 2350

## 2014-05-10 NOTE — ED Notes (Signed)
Pt has been taking pain medication for 2 weeks after mastectomy.

## 2014-05-16 ENCOUNTER — Other Ambulatory Visit: Payer: Self-pay

## 2014-05-16 ENCOUNTER — Ambulatory Visit: Payer: BC Managed Care – PPO | Admitting: Hematology and Oncology

## 2014-05-16 ENCOUNTER — Telehealth: Payer: Self-pay

## 2014-05-16 ENCOUNTER — Telehealth: Payer: Self-pay | Admitting: Hematology and Oncology

## 2014-05-16 NOTE — Telephone Encounter (Signed)
m °

## 2014-05-16 NOTE — Telephone Encounter (Signed)
1447 - pof sent to r/s pt appt [facility].  Pt LM to reschedule appt missed today.  Pt LM asking for call back "have questions"  LMOVM - returning pt call.  Pt to return call to clinic in am.

## 2014-05-22 ENCOUNTER — Telehealth: Payer: Self-pay | Admitting: *Deleted

## 2014-05-22 NOTE — Telephone Encounter (Signed)
Spoke with patient.  She states she had surgery 04/26/14 with Dr. Ala Dach at [facility].  She states that she would've had surgery here but her husband insisted on her going to [place].  I have requested her oncotype results from [facility].  She is aware of her appointment with Dr. Lindi Adie on 05/26/14.  Encouraged her to call with any needs or concerns.

## 2014-05-26 ENCOUNTER — Telehealth: Payer: Self-pay | Admitting: Hematology and Oncology

## 2014-05-26 ENCOUNTER — Ambulatory Visit (HOSPITAL_BASED_OUTPATIENT_CLINIC_OR_DEPARTMENT_OTHER): Payer: BLUE CROSS/BLUE SHIELD | Admitting: Hematology and Oncology

## 2014-05-26 ENCOUNTER — Encounter: Payer: Self-pay | Admitting: *Deleted

## 2014-05-26 ENCOUNTER — Telehealth: Payer: Self-pay | Admitting: *Deleted

## 2014-05-26 VITALS — BP 124/87 | HR 79 | Temp 98.4°F | Resp 18 | Ht 65.0 in | Wt 122.1 lb

## 2014-05-26 DIAGNOSIS — C50912 Malignant neoplasm of unspecified site of left female breast: Principal | ICD-10-CM

## 2014-05-26 DIAGNOSIS — C50411 Malignant neoplasm of upper-outer quadrant of right female breast: Secondary | ICD-10-CM

## 2014-05-26 DIAGNOSIS — C50911 Malignant neoplasm of unspecified site of right female breast: Secondary | ICD-10-CM

## 2014-05-26 DIAGNOSIS — C50812 Malignant neoplasm of overlapping sites of left female breast: Secondary | ICD-10-CM

## 2014-05-26 NOTE — Telephone Encounter (Signed)
Received oncotype DX results. Sent to scan.

## 2014-05-26 NOTE — Progress Notes (Signed)
Patient Care Team: Sheela Stack, MD as PCP - General (Endocrinology) [name], MD as Consulting Physician (Gynecology) Autumn Messing III, MD as Consulting Physician (General Surgery) Rulon Eisenmenger, MD as Consulting Physician (Hematology and Oncology)  DIAGNOSIS: Bilateral breast cancer   Staging form: Breast, AJCC 7th Edition     Clinical: Stage IA (T1c, N0, cM0) - Unsigned       Staging comments: Staged at breast conference on 01/18/14.      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   01/20/2014 Procedure Left breast 6:00 position biopsy: IDC with DCIS ER 100%, PR 45%, Ki-67 5%, HER-2 negative ratio 1.13; clinical T1 B. N0 M0 stage IA   02/06/2014 Procedure BRCA1 and 2 are negative, Breast Next panel Testing was normal and did not reveal any clearly harmful mutation in these genes. The genes tested were ATM, BARD1, BRCA1, BRCA2, BRIP1, CDH1, CHEK2, MRE11A, MUTYH, NBN, NF1, PALB2, PTEN, RAD50, RAD51C, RAD51D,   04/26/2014 Surgery Bilateral mastectomies with reconstruction, right breast 1.2 cm, ER 100%, PR 100%, HER-2 negative ratio 0.9, 2 sentinel nodes negative T1 cN0 M0 stage IA, left breast 0.8 cm ER/PR positive HER-2 -3 sentinel nodes negative T1b N0 M0 stage IA    CHIEF COMPLIANT: Follow-up after bilateral mastectomies and  reconstruction  INTERVAL HISTORY: Brandy [name] is a 54 year old lady with above-mentioned history of bilateral breast cancers who took a long time to decide on surgery. She finally underwent bilateral mastectomies with reconstruction at [facility]. He is here today to discuss the final pathology report. She had Oncotype DX testing which came back as low risk. She is accompanied by her family today. She finally had her last drain removed recently. She is still very sore and numb in her armpits.  REVIEW OF SYSTEMS:   Constitutional: Denies fevers, chills or abnormal weight loss Eyes: Denies blurriness of vision Ears, nose, mouth, throat, and face: Denies mucositis or sore throat Respiratory: Denies cough, dyspnea or wheezes Cardiovascular: Denies palpitation, chest discomfort or lower extremity swelling Gastrointestinal:  Denies nausea, heartburn or change in bowel habits Skin: Denies abnormal skin rashes Lymphatics: Denies new lymphadenopathy or easy bruising Neurological:Denies numbness, tingling or new weaknesses Behavioral/Psych: Mood is stable, no new changes  Breast: Soreness from recent surgery All other systems were reviewed with the patient and are negative.  I have reviewed the past medical history, past surgical history, social history and family history with the patient and they are unchanged from previous note.  ALLERGIES:  is allergic to zofran.  MEDICATIONS:  Current Outpatient Prescriptions  Medication Sig Dispense Refill  . ALPRAZolam (XANAX) 0.5 MG tablet Take 0.5-1 mg by mouth 4 (four) times daily as needed for anxiety. Take 1/2 to 1 tablet daily as needed for acute anxiety/insomnia    . cholecalciferol (VITAMIN D) 1000 UNITS tablet Take 1,000 Units by mouth  daily.    . docusate sodium (COLACE) 100 MG capsule Take 100 mg by mouth daily as needed for mild constipation or moderate constipation.   0  . ibuprofen (ADVIL,MOTRIN) 200 MG tablet Take 400 mg by  mouth every 6 (six) hours as needed. For headache    . lidocaine-hydrocortisone (ANAMANTEL HC) 3-0.5 % CREA Apply 1 Applicatorful topically 2 (two) times daily.   4  . Multiple Vitamin (MULTIVITAMIN) tablet Take 1 tablet by mouth daily.      . ondansetron (ZOFRAN-ODT) 4 MG disintegrating tablet Take 4 mg by mouth every 8 (eight) hours as needed for nausea.   0  . oxyCODONE-acetaminophen (PERCOCET) 7.5-325 MG per tablet Take 1 tablet by mouth every 6 (six) hours as needed for pain.   0  . polyethylene glycol (MIRALAX / GLYCOLAX) packet Take 17 g by mouth daily as needed for mild constipation or moderate constipation.     . promethazine (PHENERGAN) 25 MG tablet Take 1 tablet (25 mg total) by mouth every 8 (eight) hours as needed for nausea or vomiting. 12 tablet 0  . senna-docusate (SENOKOT-S) 8.6-50 MG per tablet Take 2 tablets by mouth daily. 20 tablet 0  . tamoxifen (NOLVADEX) 20 MG tablet Take 1 tablet (20 mg total) by mouth daily. 90 tablet 3  . zolpidem (AMBIEN CR) 12.5 MG CR tablet Take 1 tablet by mouth at bedtime as needed for sleep.      No current facility-administered medications for this visit.    PHYSICAL EXAMINATION: ECOG PERFORMANCE STATUS: 1 - Symptomatic but completely ambulatory  Filed Vitals:   05/26/14 1224  BP: 124/87  Pulse: 79  Temp: 98.4 F (36.9 C)  Resp: 18   Filed Weights   05/26/14 1224  Weight: 122 lb 1.6 oz (55.384 kg)    GENERAL:alert, no distress and comfortable SKIN: skin color, texture, turgor are normal, no rashes or significant lesions EYES: normal, Conjunctiva are pink and non-injected, sclera clear OROPHARYNX:no exudate, no erythema and lips, buccal mucosa, and tongue normal  NECK: supple, thyroid normal size, non-tender, without nodularity LYMPH:  no palpable lymphadenopathy in the cervical, axillary or inguinal LUNGS: clear to auscultation and percussion with normal breathing effort HEART: regular rate & rhythm and no murmurs and no lower  extremity edema ABDOMEN:abdomen soft, non-tender and normal bowel sounds Musculoskeletal:no cyanosis of digits and no clubbing  NEURO: alert & oriented x 3 with fluent speech, no focal motor/sensory deficits  LABORATORY DATA:  I have reviewed the data as listed   Chemistry      Component Value Date/Time   NA 140 05/10/2014 2005   NA 143 03/30/2014 1514   K 3.5 05/10/2014 2005   K 3.6 03/30/2014 1514   CL 106 05/10/2014 2005   CO2 23 05/10/2014 2005   CO2 28 03/30/2014 1514   BUN <5* 05/10/2014 2005   BUN 12.0 03/30/2014 1514   CREATININE 0.60 05/10/2014 2005   CREATININE 0.8 03/30/2014 1514   CREATININE 0.52 07/27/2013 1621      Component Value Date/Time   CALCIUM 9.5 05/10/2014 2005   CALCIUM 9.5 03/30/2014 1514   ALKPHOS 49 03/30/2014 1514   ALKPHOS 50 07/27/2013 1621   AST 16 03/30/2014 1514   AST 20 07/27/2013 1621   ALT 12 03/30/2014 1514   ALT 22 07/27/2013 1621   BILITOT 0.53 03/30/2014 1514   BILITOT 0.4 07/27/2013 1621       Lab Results  Component Value Date   WBC 9.0 05/10/2014  HGB 11.4* 05/10/2014   HCT 33.5* 05/10/2014   MCV 92.3 05/10/2014   PLT 476* 05/10/2014   NEUTROABS 7.1 05/10/2014    ASSESSMENT & PLAN:  Bilateral breast cancer 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%, PR 100%, HER-2 negative Pathological stage: T1 cN0 M0 ER 100%, PR 100%, HER-2 negative ratio 0.9, margins negative, 2 SLN negative  Left breast: Clinical T1 BN 0 M0 invasive ductal carcinoma ER positive PR 45%, HER-2 negative Pathologic stage: T1b N0 M0 ER 100% PR 45% HER-2 negative ratio 1.1 margins clear, 3 sentinel nodes negative  Oncotype DX recurrence score 16, 10% risk of recurrence I discussed with her the results of Oncotype DX testing and because it is low risk she does not need chemotherapy.  Recommendation: Tamoxifen 20 mg daily for next 10 years. I instructed her to resume tamoxifen currently. I would like to see her back in 6 months  for follow-up. She would have finished breast reconstruction by that time.     No orders of the defined types were placed in this encounter.   The patient has a good understanding of the overall plan. she agrees with it. She will call with any problems that may develop before her next visit here.   Rulon Eisenmenger, MD 05/26/2014 12:46 PM

## 2014-05-26 NOTE — Progress Notes (Signed)
Received office notes from [facility]. Sent to scan.

## 2014-05-26 NOTE — Assessment & Plan Note (Signed)
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%, PR 100%, HER-2 negative Pathological stage: T1 cN0 M0 ER 100%, PR 100%, HER-2 negative ratio 0.9, margins negative, 2 SLN negative  Left breast: Clinical T1 BN 0 M0 invasive ductal carcinoma ER positive PR 45%, HER-2 negative Pathologic stage: T1b N0 M0 ER 100% PR 45% HER-2 negative ratio 1.1 margins clear, 3 sentinel nodes negative  Oncotype DX recurrence score 16, 10% risk of recurrence I discussed with her the results of Oncotype DX testing and because it is low risk she does not need chemotherapy.  Recommendation: Tamoxifen 20 mg daily for next 10 years. I instructed her to resume tamoxifen currently. I would like to see her back in 6 months for follow-up. She would have finished breast reconstruction by that time.

## 2014-08-10 ENCOUNTER — Encounter: Payer: Self-pay | Admitting: Gynecology

## 2014-08-10 ENCOUNTER — Ambulatory Visit (INDEPENDENT_AMBULATORY_CARE_PROVIDER_SITE_OTHER): Payer: BLUE CROSS/BLUE SHIELD | Admitting: Gynecology

## 2014-08-10 VITALS — BP 134/80 | Ht 65.0 in | Wt 122.0 lb

## 2014-08-10 DIAGNOSIS — N898 Other specified noninflammatory disorders of vagina: Secondary | ICD-10-CM

## 2014-08-10 DIAGNOSIS — Z01419 Encounter for gynecological examination (general) (routine) without abnormal findings: Secondary | ICD-10-CM

## 2014-08-10 DIAGNOSIS — N952 Postmenopausal atrophic vaginitis: Secondary | ICD-10-CM

## 2014-08-10 LAB — COMPREHENSIVE METABOLIC PANEL
ALT: 50 U/L — ABNORMAL HIGH (ref 0–35)
AST: 26 U/L (ref 0–37)
Albumin: 4 g/dL (ref 3.5–5.2)
Alkaline Phosphatase: 74 U/L (ref 39–117)
BUN: 13 mg/dL (ref 6–23)
CO2: 26 mEq/L (ref 19–32)
Calcium: 9.1 mg/dL (ref 8.4–10.5)
Chloride: 101 mEq/L (ref 96–112)
Creat: 0.52 mg/dL (ref 0.50–1.10)
Glucose, Bld: 79 mg/dL (ref 70–99)
Potassium: 3.7 mEq/L (ref 3.5–5.3)
Sodium: 137 mEq/L (ref 135–145)
Total Bilirubin: 0.5 mg/dL (ref 0.2–1.2)
Total Protein: 6.5 g/dL (ref 6.0–8.3)

## 2014-08-10 LAB — LIPID PANEL
Cholesterol: 166 mg/dL (ref 0–200)
HDL: 54 mg/dL (ref >=46)
LDL Cholesterol: 96 mg/dL (ref 0–99)
Total CHOL/HDL Ratio: 3.1 Ratio
Triglycerides: 82 mg/dL (ref <150)
VLDL: 16 mg/dL (ref 0–40)

## 2014-08-10 LAB — CBC WITH DIFFERENTIAL/PLATELET
Basophils Absolute: 0 10*3/uL (ref 0.0–0.1)
Basophils Relative: 0 % (ref 0–1)
Eosinophils Absolute: 0.1 10*3/uL (ref 0.0–0.7)
Eosinophils Relative: 1 % (ref 0–5)
HCT: 38 % (ref 36.0–46.0)
Hemoglobin: 12.6 g/dL (ref 12.0–15.0)
Lymphocytes Relative: 19 % (ref 12–46)
Lymphs Abs: 1 10*3/uL (ref 0.7–4.0)
MCH: 29.7 pg (ref 26.0–34.0)
MCHC: 33.2 g/dL (ref 30.0–36.0)
MCV: 89.6 fL (ref 78.0–100.0)
MPV: 10.1 fL (ref 8.6–12.4)
Monocytes Absolute: 0.4 10*3/uL (ref 0.1–1.0)
Monocytes Relative: 7 % (ref 3–12)
Neutro Abs: 3.7 10*3/uL (ref 1.7–7.7)
Neutrophils Relative %: 73 % (ref 43–77)
Platelets: 292 10*3/uL (ref 150–400)
RBC: 4.24 MIL/uL (ref 3.87–5.11)
RDW: 13.8 % (ref 11.5–15.5)
WBC: 5 10*3/uL (ref 4.0–10.5)

## 2014-08-10 LAB — WET PREP FOR TRICH, YEAST, CLUE: Trich, Wet Prep: NONE SEEN

## 2014-08-10 MED ORDER — FLUCONAZOLE 150 MG PO TABS: 150.0000 mg | ORAL_TABLET | Freq: Once | ORAL | Status: DC

## 2014-08-10 MED ORDER — METRONIDAZOLE 500 MG PO TABS: 500.0000 mg | ORAL_TABLET | Freq: Two times a day (BID) | ORAL | Status: DC

## 2014-08-10 NOTE — Progress Notes (Signed)
Brandy [name] Mar 12, 1961 [id number]        54 y.o.  G3P3003 for annual exam.  Several issues noted below.  Past medical history,surgical history, problem list, medications, allergies, family history and social history were all reviewed and documented as reviewed in the EPIC chart.  ROS:  Performed with pertinent positives and negatives included in the history, assessment and plan.   Additional significant findings :  none   Exam: Brandy [name] Vitals:   08/10/14 1134  BP: 134/80  Height: _0  (1.651 m)  Weight: 122 lb (55.339 kg)   General appearance:  Normal affect, orientation and appearance. Skin: Grossly normal HEENT: Without gross lesions.  No cervical or supraclavicular adenopathy. Thyroid normal.  Lungs:  Clear without wheezing, rales or rhonchi Cardiac: RR, without RMG Abdominal:  Soft, nontender, without masses, guarding, rebound, organomegaly or hernia Breasts:  Examined lying and sitting. Status post bilateral mastectomies with bilateral expanders in place. Healing scar left breast. No masses or axillary adenopathy Pelvic:  Ext/BUS/vagina with heavy white discharge.  Cervix atrophic  Uterus anteverted, normal size, shape and contour, midline and mobile nontender   Adnexa  Without masses or tenderness    Anus and perineum  Normal   Rectovaginal  Normal sphincter tone without palpated masses or tenderness.    Assessment/Plan:  54 y.o. G3P3003 female for annual exam.   1. Perimenopausal. LMP 01/2014. No bleeding since. No significant hot flashes or night sweats. Continue to monitor and report any further bleeding. 2. Bilateral breast cancer with bilateral mastectomies. Now with expanders. Actively followed by oncology on tamoxifen. BRCA negative exam NED. Continue to follow up with oncology. 3. Pap smear/HPV negative 06/2012. No Pap smear done today. No history of abnormal Pap smears previously. Plan repeat Pap smear at 3-5 year interval per current screening  guidelines. 4. Colonoscopy 2015. Repeat at their recommended interval. 5. DEXA never. Will plan further into the menopause. Currently on tamoxifen. Increased calcium and vitamin D reviewed. Check vitamin D level today. 6. Vaginal discharge. Patient without symptoms but has heavy discharge on exam. KOH wet prep is positive for yeast and bacterial vaginosis. Treat with Diflucan 150 mg daily 2 days and Flagyl 500 mg twice a day 7 days. Alcohol avoidance reviewed. 7. Health maintenance. Baseline CBC comprehensive metabolic panel lipid profile TSH urine analysis vitamin D ordered. Follow up in one year, sooner as needed.     [name] MD, 12:40 PM 08/10/2014

## 2014-08-10 NOTE — Patient Instructions (Signed)
Take the Diflucan pill daily for 2 days Take the Flagyl medication twice daily for 7 days, avoid alcohol while taking.  You may obtain a copy of any labs that were done today by logging onto MyChart as outlined in the instructions provided with your AVS (after visit summary). The office will not call with normal lab results but certainly if there are any significant abnormalities then we will contact you.   Health Maintenance, Female A healthy lifestyle and preventative care can promote health and wellness.  Maintain regular health, dental, and eye exams.  Eat a healthy diet. Foods like vegetables, fruits, whole grains, low-fat dairy products, and lean protein foods contain the nutrients you need without too many calories. Decrease your intake of foods high in solid fats, added sugars, and salt. Get information about a proper diet from your caregiver, if necessary.  Regular physical exercise is one of the most important things you can do for your health. Most adults should get at least 150 minutes of moderate-intensity exercise (any activity that increases your heart rate and causes you to sweat) each week. In addition, most adults need muscle-strengthening exercises on 2 or more days a week.   Maintain a healthy weight. The body mass index (BMI) is a screening tool to identify possible weight problems. It provides an estimate of body fat based on height and weight. Your caregiver can help determine your BMI, and can help you achieve or maintain a healthy weight. For adults 20 years and older:  A BMI below 18.5 is considered underweight.  A BMI of 18.5 to 24.9 is normal.  A BMI of 25 to 29.9 is considered overweight.  A BMI of 30 and above is considered obese.  Maintain normal blood lipids and cholesterol by exercising and minimizing your intake of saturated fat. Eat a balanced diet with plenty of fruits and vegetables. Blood tests for lipids and cholesterol should begin at age 77 and be  repeated every 5 years. If your lipid or cholesterol levels are high, you are over 50, or you are a high risk for heart disease, you may need your cholesterol levels checked more frequently.Ongoing high lipid and cholesterol levels should be treated with medicines if diet and exercise are not effective.  If you smoke, find out from your caregiver how to quit. If you do not use tobacco, do not start.  Lung cancer screening is recommended for adults aged 13 80 years who are at high risk for developing lung cancer because of a history of smoking. Yearly low-dose computed tomography (CT) is recommended for people who have at least a 30-pack-year history of smoking and are a current smoker or have quit within the past 15 years. A pack year of smoking is smoking an average of 1 pack of cigarettes a day for 1 year (for example: 1 pack a day for 30 years or 2 packs a day for 15 years). Yearly screening should continue until the smoker has stopped smoking for at least 15 years. Yearly screening should also be stopped for people who develop a health problem that would prevent them from having lung cancer treatment.  If you are pregnant, do not drink alcohol. If you are breastfeeding, be very cautious about drinking alcohol. If you are not pregnant and choose to drink alcohol, do not exceed 1 drink per day. One drink is considered to be 12 ounces (355 mL) of beer, 5 ounces (148 mL) of wine, or 1.5 ounces (44 mL) of liquor.  Avoid use of street drugs. Do not share needles with anyone. Ask for help if you need support or instructions about stopping the use of drugs.  High blood pressure causes heart disease and increases the risk of stroke. Blood pressure should be checked at least every 1 to 2 years. Ongoing high blood pressure should be treated with medicines, if weight loss and exercise are not effective.  If you are 44 to 53 years old, ask your caregiver if you should take aspirin to prevent strokes.  Diabetes  screening involves taking a blood sample to check your fasting blood sugar level. This should be done once every 3 years, after age 54, if you are within normal weight and without risk factors for diabetes. Testing should be considered at a younger age or be carried out more frequently if you are overweight and have at least 1 risk factor for diabetes.  Breast cancer screening is essential preventative care for women. You should practice "breast self-awareness." This means understanding the normal appearance and feel of your breasts and may include breast self-examination. Any changes detected, no matter how small, should be reported to a caregiver. Women in their 31s and 30s should have a clinical breast exam (CBE) by a caregiver as part of a regular health exam every 1 to 3 years. After age 12, women should have a CBE every year. Starting at age 70, women should consider having a mammogram (breast X-ray) every year. Women who have a family history of breast cancer should talk to their caregiver about genetic screening. Women at a high risk of breast cancer should talk to their caregiver about having an MRI and a mammogram every year.  Breast cancer gene (BRCA)-related cancer risk assessment is recommended for women who have family members with BRCA-related cancers. BRCA-related cancers include breast, ovarian, tubal, and peritoneal cancers. Having family members with these cancers may be associated with an increased risk for harmful changes (mutations) in the breast cancer genes BRCA1 and BRCA2. Results of the assessment will determine the need for genetic counseling and BRCA1 and BRCA2 testing.  The Pap test is a screening test for cervical cancer. Women should have a Pap test starting at age 35. Between ages 63 and 55, Pap tests should be repeated every 2 years. Beginning at age 24, you should have a Pap test every 3 years as long as the past 3 Pap tests have been normal. If you had a hysterectomy for a  problem that was not cancer or a condition that could lead to cancer, then you no longer need Pap tests. If you are between ages 62 and 39, and you have had normal Pap tests going back 10 years, you no longer need Pap tests. If you have had past treatment for cervical cancer or a condition that could lead to cancer, you need Pap tests and screening for cancer for at least 20 years after your treatment. If Pap tests have been discontinued, risk factors (such as a new sexual partner) need to be reassessed to determine if screening should be resumed. Some women have medical problems that increase the chance of getting cervical cancer. In these cases, your caregiver may recommend more frequent screening and Pap tests.  The human papillomavirus (HPV) test is an additional test that may be used for cervical cancer screening. The HPV test looks for the virus that can cause the cell changes on the cervix. The cells collected during the Pap test can be tested for HPV. The  HPV test could be used to screen women aged 41 years and older, and should be used in women of any age who have unclear Pap test results. After the age of 65, women should have HPV testing at the same frequency as a Pap test.  Colorectal cancer can be detected and often prevented. Most routine colorectal cancer screening begins at the age of 40 and continues through age 86. However, your caregiver may recommend screening at an earlier age if you have risk factors for colon cancer. On a yearly basis, your caregiver may provide home test kits to check for hidden blood in the stool. Use of a small camera at the end of a tube, to directly examine the colon (sigmoidoscopy or colonoscopy), can detect the earliest forms of colorectal cancer. Talk to your caregiver about this at age 67, when routine screening begins. Direct examination of the colon should be repeated every 5 to 10 years through age 59, unless early forms of pre-cancerous polyps or small growths  are found.  Hepatitis C blood testing is recommended for all people born from [birth year] through 1964 and any individual with known risks for hepatitis C.  Practice safe sex. Use condoms and avoid high-risk sexual practices to reduce the spread of sexually transmitted infections (STIs). Sexually active women aged 74 and younger should be checked for Chlamydia, which is a common sexually transmitted infection. Older women with new or multiple partners should also be tested for Chlamydia. Testing for other STIs is recommended if you are sexually active and at increased risk.  Osteoporosis is a disease in which the bones lose minerals and strength with aging. This can result in serious bone fractures. The risk of osteoporosis can be identified using a bone density scan. Women ages 23 and over and women at risk for fractures or osteoporosis should discuss screening with their caregivers. Ask your caregiver whether you should be taking a calcium supplement or vitamin D to reduce the rate of osteoporosis.  Menopause can be associated with physical symptoms and risks. Hormone replacement therapy is available to decrease symptoms and risks. You should talk to your caregiver about whether hormone replacement therapy is right for you.  Use sunscreen. Apply sunscreen liberally and repeatedly throughout the day. You should seek shade when your shadow is shorter than you. Protect yourself by wearing long sleeves, pants, a wide-brimmed hat, and sunglasses year round, whenever you are outdoors.  Notify your caregiver of new moles or changes in moles, especially if there is a change in shape or color. Also notify your caregiver if a mole is larger than the size of a pencil eraser.  Stay current with your immunizations. Document Released: 11/18/2010 Document Revised: 08/30/2012 Document Reviewed: 11/18/2010 [facility] Patient Information 2014 [place].

## 2014-08-11 ENCOUNTER — Telehealth: Payer: Self-pay | Admitting: *Deleted

## 2014-08-11 LAB — URINALYSIS W MICROSCOPIC + REFLEX CULTURE
Bilirubin Urine: NEGATIVE
Casts: NONE SEEN
Crystals: NONE SEEN
Glucose, UA: NEGATIVE mg/dL
Hgb urine dipstick: NEGATIVE
Ketones, ur: NEGATIVE mg/dL
Nitrite: NEGATIVE
Protein, ur: NEGATIVE mg/dL
Specific Gravity, Urine: 1.02 (ref 1.005–1.030)
Urobilinogen, UA: 0.2 mg/dL (ref 0.0–1.0)
pH: 6 (ref 5.0–8.0)

## 2014-08-11 LAB — VITAMIN D 25 HYDROXY (VIT D DEFICIENCY, FRACTURES): Vit D, 25-Hydroxy: 20 ng/mL — ABNORMAL LOW (ref 30–100)

## 2014-08-11 LAB — TSH: TSH: 1.005 u[IU]/mL (ref 0.350–4.500)

## 2014-08-11 NOTE — Telephone Encounter (Signed)
Patient called somewhat concerned about having PAP smears every other year vs.yearly.  Encouraged her to follow Dr. Zelphia Cairo recommendation; that we would not be concerned about that recommendation even though she has had breast cancer.

## 2014-08-12 LAB — URINE CULTURE
Colony Count: NO GROWTH
Organism ID, Bacteria: NO GROWTH

## 2014-08-14 ENCOUNTER — Telehealth: Payer: Self-pay | Admitting: *Deleted

## 2014-08-14 ENCOUNTER — Other Ambulatory Visit: Payer: Self-pay | Admitting: Gynecology

## 2014-08-14 DIAGNOSIS — E559 Vitamin D deficiency, unspecified: Secondary | ICD-10-CM

## 2014-08-14 DIAGNOSIS — R748 Abnormal levels of other serum enzymes: Secondary | ICD-10-CM

## 2014-08-14 NOTE — Telephone Encounter (Signed)
Pt informed with the below note. 

## 2014-08-14 NOTE — Telephone Encounter (Signed)
Pt noted on OV that no bleeding cycle since 01/2014, no hot flashes or night sweats. Pt said that day she started bleeding and still bleeding. Was told to rely information to you if this should occur. Please advise

## 2014-08-14 NOTE — Telephone Encounter (Signed)
It is not unusual to have a menses occasionally in the perimenopausal time. As she is not one for year out from her last. Then let's see what she does from now. Assuming the bleeding goes away then just call if she has another episode of bleeding.

## 2014-08-28 ENCOUNTER — Telehealth: Payer: Self-pay

## 2014-08-28 NOTE — Telephone Encounter (Signed)
Patient said she started bleeding with her exam on 08/14/14 and continues to bleed like a period.  Prior to that had not had menses since Sept 2015. Rec?

## 2014-08-29 NOTE — Telephone Encounter (Signed)
I would watch for now. One option would be to give her a short course of progesterone hormone. With recent diagnosis of hormone receptor positive breast cancer would prefer to hold on this if possible.  If bleeding would continue for another week or 2 then we can rediscuss options.

## 2014-08-29 NOTE — Telephone Encounter (Signed)
I called patient back but mailbox is full and cannot accept messages. Will try again later.

## 2014-08-30 ENCOUNTER — Telehealth: Payer: Self-pay | Admitting: *Deleted

## 2014-08-30 NOTE — Telephone Encounter (Signed)
Pt left a message in voicemail with questions, I called pt back and asked her to call me.

## 2014-08-30 NOTE — Telephone Encounter (Signed)
Patient informed. 

## 2014-08-31 NOTE — Telephone Encounter (Signed)
Patient called in voice mail returning Jennifer's call. I called her back but got her voicemail. I encouraged her when she calls back to just leave a detailed message with her question in the triage voice mailbox and we can go ahead and get her an answer so when we do finally catch up with her we have information for her.

## 2014-09-20 ENCOUNTER — Encounter: Payer: Self-pay | Admitting: Gynecology

## 2014-09-20 ENCOUNTER — Ambulatory Visit (INDEPENDENT_AMBULATORY_CARE_PROVIDER_SITE_OTHER): Payer: BLUE CROSS/BLUE SHIELD | Admitting: Gynecology

## 2014-09-20 VITALS — BP 122/76

## 2014-09-20 DIAGNOSIS — R3915 Urgency of urination: Secondary | ICD-10-CM

## 2014-09-20 DIAGNOSIS — N926 Irregular menstruation, unspecified: Secondary | ICD-10-CM | POA: Diagnosis not present

## 2014-09-20 NOTE — Patient Instructions (Signed)
Follow up for ultrasound as scheduled 

## 2014-09-20 NOTE — Progress Notes (Signed)
CICI RODRIGES 08-16-1960 [id number]        54 y.o.  [id number] Presents complaining of continued bleeding on and off for the past month. Had not had a period since the fall. Elevated FSH last year. Started bleeding passing clots and then this is tapered and resolved. Also noting some mild urinary urgency. No incontinence frequency dysuria low back pain.  Past medical history,surgical history, problem list, medications, allergies, family history and social history were all reviewed and documented in the EPIC chart.  Directed ROS with pertinent positives and negatives documented in the history of present illness/assessment and plan.  Exam: [name] assistant Filed Vitals:   09/20/14 1447  BP: 122/76   General appearance:  Normal Abdomen soft nontender without masses guarding rebound Pelvic external BUS vagina with atrophic changes. Cervix grossly normal. Uterus normal size midline mobile nontender. Adnexa without masses or tenderness.  Assessment/Plan:  54 y.o. [id number] with perimenopausal bleeding. Will check baseline sonohysterogram. Also with some mild urgency. Check baseline urinalysis.  Elevated ALT at 50 on recent lab work. Recheck comprehensive metabolic panel. Follow up for ultrasound as scheduled.    [name] MD, 3:08 PM 09/20/2014

## 2014-09-21 ENCOUNTER — Other Ambulatory Visit: Payer: Self-pay | Admitting: Gynecology

## 2014-09-21 ENCOUNTER — Telehealth: Payer: Self-pay

## 2014-09-21 DIAGNOSIS — N939 Abnormal uterine and vaginal bleeding, unspecified: Secondary | ICD-10-CM

## 2014-09-21 LAB — URINALYSIS W MICROSCOPIC + REFLEX CULTURE
Bacteria, UA: NONE SEEN
Bilirubin Urine: NEGATIVE
Casts: NONE SEEN
Crystals: NONE SEEN
Glucose, UA: NEGATIVE mg/dL
Ketones, ur: NEGATIVE mg/dL
Leukocytes, UA: NEGATIVE
Nitrite: NEGATIVE
Protein, ur: NEGATIVE mg/dL
Specific Gravity, Urine: 1.008 (ref 1.005–1.030)
Urobilinogen, UA: 0.2 mg/dL (ref 0.0–1.0)
pH: 5.5 (ref 5.0–8.0)

## 2014-09-21 LAB — COMPREHENSIVE METABOLIC PANEL
ALT: 15 U/L (ref 0–35)
AST: 17 U/L (ref 0–37)
Albumin: 4.2 g/dL (ref 3.5–5.2)
Alkaline Phosphatase: 52 U/L (ref 39–117)
BUN: 13 mg/dL (ref 6–23)
CO2: 28 mEq/L (ref 19–32)
Calcium: 9.1 mg/dL (ref 8.4–10.5)
Chloride: 105 mEq/L (ref 96–112)
Creat: 0.57 mg/dL (ref 0.50–1.10)
Glucose, Bld: 89 mg/dL (ref 70–99)
Potassium: 3.7 mEq/L (ref 3.5–5.3)
Sodium: 141 mEq/L (ref 135–145)
Total Bilirubin: 0.6 mg/dL (ref 0.2–1.2)
Total Protein: 6.8 g/dL (ref 6.0–8.3)

## 2014-09-21 NOTE — Telephone Encounter (Signed)
I called patient with result. She asked me to let Dr. Loetta Rough know her period has started back at full flow again. It had almost stopped. She is scheduled for [facility] 10/02/14.

## 2014-10-02 ENCOUNTER — Ambulatory Visit (INDEPENDENT_AMBULATORY_CARE_PROVIDER_SITE_OTHER): Payer: BLUE CROSS/BLUE SHIELD | Admitting: Gynecology

## 2014-10-02 ENCOUNTER — Other Ambulatory Visit: Payer: Self-pay | Admitting: Gynecology

## 2014-10-02 ENCOUNTER — Encounter: Payer: Self-pay | Admitting: Gynecology

## 2014-10-02 ENCOUNTER — Ambulatory Visit (INDEPENDENT_AMBULATORY_CARE_PROVIDER_SITE_OTHER): Payer: BLUE CROSS/BLUE SHIELD

## 2014-10-02 VITALS — BP 118/76

## 2014-10-02 DIAGNOSIS — R9389 Abnormal findings on diagnostic imaging of other specified body structures: Secondary | ICD-10-CM

## 2014-10-02 DIAGNOSIS — D251 Intramural leiomyoma of uterus: Secondary | ICD-10-CM | POA: Diagnosis not present

## 2014-10-02 DIAGNOSIS — R934 Abnormal findings on diagnostic imaging of urinary organs: Secondary | ICD-10-CM

## 2014-10-02 DIAGNOSIS — N9489 Other specified conditions associated with female genital organs and menstrual cycle: Secondary | ICD-10-CM

## 2014-10-02 DIAGNOSIS — N926 Irregular menstruation, unspecified: Secondary | ICD-10-CM | POA: Diagnosis not present

## 2014-10-02 DIAGNOSIS — R938 Abnormal findings on diagnostic imaging of other specified body structures: Secondary | ICD-10-CM | POA: Diagnosis not present

## 2014-10-02 DIAGNOSIS — N8300 Follicular cyst of ovary, unspecified side: Secondary | ICD-10-CM

## 2014-10-02 DIAGNOSIS — N939 Abnormal uterine and vaginal bleeding, unspecified: Secondary | ICD-10-CM

## 2014-10-02 DIAGNOSIS — N924 Excessive bleeding in the premenopausal period: Secondary | ICD-10-CM

## 2014-10-02 NOTE — Progress Notes (Signed)
Brandy [name] 25-Oct-1960 [id number]        54 y.o.  [id number] Presents for sonohysterogram. History of LMP in the fall of 2015. Started bleeding over the last month or so on and off sometimes very heavy passing clots. Not having significant hot flashes or night sweats. [place] 54 last year. Currently on tamoxifen for her breast cancer.  Past medical history,surgical history, problem list, medications, allergies, family history and social history were all reviewed and documented in the EPIC chart.  Directed ROS with pertinent positives and negatives documented in the history of present illness/assessment and plan.  Exam: [place] assistant Filed Vitals:   10/02/14 1059  BP: 118/76   General appearance:  Normal Abdomen soft nontender without masses guarding rebound Pelvic external BUS vagina with atrophic changes. No active bleeding. Cervix normal. Uterus grossly normal midline mobile nontender. Adnexa without masses or tenderness.  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 Endometrial echo 24 mm. Left ovary with small echo-free follicles. Right ovary atrophic. Cul-de-sac negative.  Sonohysterogram performed, sterile technique, easy catheter introduction, good distention with no distinct polyp but thickened anterior endometrium questionable sessile polyp versus hyperplasia. Color flow noted within this area. Endometrial sample taken. Patient tolerated well.  Assessment/Plan:  54 y.o. [id number] with perimenopausal irregular bleeding. On tamoxifen. Thicker endometrium with questionable polyp with color flow. Patient will follow up for biopsy results. Recommended we proceed with hysteroscopy D&C to evacuate the uterus for complete assessment of the endometrium. Patient understands risks you that this will stop her bleeding given her perimenopausal status. Patient agrees with the plan and we will go ahead and schedule this and she'll represent for a full preoperative consult  before hand.    [name] MD, 11:32 AM 10/02/2014

## 2014-10-02 NOTE — Patient Instructions (Signed)
Office will call you with biopsy results.  Office will call you to arrange surgery. 

## 2014-10-04 ENCOUNTER — Telehealth: Payer: Self-pay

## 2014-10-04 MED ORDER — MISOPROSTOL 200 MCG PO TABS: ORAL_TABLET | ORAL | Status: DC

## 2014-10-04 NOTE — Telephone Encounter (Signed)
I contacted patient to schedule surgery. She is scheduled for 11/10/14 at 1:00pm. Pre op consult with Dr. Phineas Real scheduled. Patient was advised about the need for Cytotec tab vaginally hs before surgery. Rx sent.

## 2014-10-10 ENCOUNTER — Encounter: Payer: Self-pay | Admitting: Gynecology

## 2014-10-18 ENCOUNTER — Telehealth: Payer: Self-pay

## 2014-10-18 NOTE — Telephone Encounter (Signed)
Patient called because her "paper" said she was having Hysteroscopy. She just wanted to confirm Dr. Loetta Rough going to [facility] that day. I told her it was scheduled as a D&C, Hysteroscopy.  I explained procedures to her.

## 2014-10-25 ENCOUNTER — Telehealth: Payer: Self-pay

## 2014-10-25 ENCOUNTER — Encounter (HOSPITAL_COMMUNITY): Payer: Self-pay | Admitting: *Deleted

## 2014-10-25 NOTE — Telephone Encounter (Signed)
Encounter already opened. 

## 2014-10-25 NOTE — Telephone Encounter (Signed)
Patient called back asking if anyway to schedule her surgery earlier. She is scheduled for 11/10/14.  She said she is tired of bleeding and ready for this period to end. She has messed up her clothes a few times.    The only thing I see is this coming Monday 13th 1:00pm but office schedule is packed.  Monday the 20th is also opened but that is only 4 days before she is scheduled. There is no other OR times available.  She is scheduled 2 weeks from tomorrow.  She said if we are not able to she understands and maybe i could watch for a cancellation.  What do you recommend?

## 2014-10-25 NOTE — Telephone Encounter (Signed)
Patient called in voice mail stating she had a few questions. I called her back and left message for her to call me.

## 2014-10-25 NOTE — Telephone Encounter (Signed)
I understand her frustration but we are talking 2 weeks and I  Think it is unfair to reschedule patients who have been scheduled for months.

## 2014-10-26 NOTE — Telephone Encounter (Signed)
I called patient and left detailed message explaining that I had studied the schedule and just do not have a way to move her earlier. I promised her I will watch out for any cancellation on Dr. Loetta Rough OR schedule and let her know if anything opens where I can move her to earlier date/time.

## 2014-11-02 ENCOUNTER — Ambulatory Visit (INDEPENDENT_AMBULATORY_CARE_PROVIDER_SITE_OTHER): Payer: BLUE CROSS/BLUE SHIELD | Admitting: Gynecology

## 2014-11-02 ENCOUNTER — Encounter: Payer: Self-pay | Admitting: Gynecology

## 2014-11-02 VITALS — BP 118/76

## 2014-11-02 DIAGNOSIS — N926 Irregular menstruation, unspecified: Secondary | ICD-10-CM | POA: Diagnosis not present

## 2014-11-02 NOTE — Progress Notes (Signed)
Brandy [name] 1960/09/06 [id number]   Preoperative consult  Chief complaint: irregular perimenopausal bleeding, thickened endometrial echo on ultrasound  History of present illness: 54 y.o. G3P3003 with history of LMP the fall 2015. Began bleeding April 2016 and has bled continuously on and off since then.  [place] last year mildly elevated.  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 Endometrial echo 24 mm. Right and left ovaries grossly normal. Cavity distention showed no clear polyp but thickened endometrium questionable sessile polyp versus hyperplastic change. Endometrial biopsy showed proliferative endometrium. Patients admitted for a more complete sampling/emptying of the endometrial cavity. This past year has been complicated by bilateral breast cancer and bilateral mastectomies.  Estrogen/progesterone receptor positive.   BRCA testing negative.  Past medical history,surgical history, medications, allergies, family history and social history were all reviewed and documented in the EPIC chart.  ROS:  Was performed and pertinent positives and negatives are included in the history of present illness.  Exam:  [name] assistant General: well developed, well nourished female, no acute distress HEENT: normal  Lungs: clear to auscultation without wheezing, rales or rhonchi  Cardiac: regular rate without rubs, murmurs or gallops  Abdomen: soft, nontender without masses, guarding, rebound, organomegaly  Pelvic: external bus vagina: normal with menses type flow. Cervix: grossly normal  Uterus: generous in size, anteverted, midline and mobile, nontender  Adnexa: without masses or tenderness     Assessment/Plan:  54 y.o. [id number] with heavy irregular bleeding in the perimenopausal time frame. Ultrasound shows thickened endometrium without clear polyp with benign endometrial sample. Patient admitted for hysteroscopy D&C for more complete evaluation of the endometrial  cavity.  I reviewed the proposed surgery with the patient to include the expected intraoperative and postoperative courses as well as the recovery period. The use of the hysteroscope, resectoscope and the D&C portion were all discussed. The risks of surgery to include infection, prolonged antibiotics, hemorrhage necessitating transfusion and the risks of transfusion, including transfusion reaction, hepatitis, HIV, mad cow disease and other unknown entities were all discussed understood and accepted. The risk of damage to internal organs during the procedure, either immediately recognized or delay recognized, including vagina, cervix, uterus, possible perforation causing damage to bowel, bladder, ureters, vessels and nerves necessitating major exploratory reparative surgery and future reparative surgeries including bladder repair, ureteral damage repair, bowel resection, ostomy formation was also discussed understood and accepted. The potential for distended media absorption leading to metabolic complications such as fluid overload, coma and seizures was also discussed understood and accepted. She understands there are no guarantees that this will relieve her bleeding given that she is in the perimenopausal time frame. The patient's questions were answered to her satisfaction and she is ready to proceed with surgery.     [name] MD, 12:53 PM 11/02/2014

## 2014-11-02 NOTE — Patient Instructions (Signed)
Followup for surgery as scheduled. 

## 2014-11-02 NOTE — H&P (Signed)
  Brandy [name] 01-Jul-1960 [id number]   History and Physical  Chief complaint: irregular perimenopausal bleeding, thickened endometrial echo on ultrasound  History of present illness: 54 y.o. G3P3003 with history of LMP the fall 2015. Began bleeding April 2016 and has bled continuously on and off since then.  [place] last year mildly elevated.  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 Endometrial echo 24 mm. Right and left ovaries grossly normal. Cavity distention showed no clear polyp but thickened endometrium questionable sessile polyp versus hyperplastic change. Endometrial biopsy showed proliferative endometrium. Patients admitted for a more complete sampling/emptying of the endometrial cavity. This past year has been complicated by bilateral breast cancer and bilateral mastectomies.  Estrogen/progesterone receptor positive.   BRCA testing negative.  Past medical history,surgical history, medications, allergies, family history and social history were all reviewed and documented in the EPIC chart.  ROS:  Was performed and pertinent positives and negatives are included in the history of present illness.  Exam:  [name] assistant General: well developed, well nourished female, no acute distress HEENT: normal  Lungs: clear to auscultation without wheezing, rales or rhonchi  Cardiac: regular rate without rubs, murmurs or gallops  Abdomen: soft, nontender without masses, guarding, rebound, organomegaly  Pelvic: external bus vagina: normal with menses type flow. Cervix: grossly normal  Uterus: generous in size, anteverted, midline and mobile, nontender  Adnexa: without masses or tenderness     Assessment/Plan:  54 y.o. [id number] with heavy irregular bleeding in the perimenopausal time frame. Ultrasound shows thickened endometrium without clear polyp with benign endometrial sample. Patient admitted for hysteroscopy D&C for more complete evaluation of the endometrial  cavity.  I reviewed the proposed surgery with the patient to include the expected intraoperative and postoperative courses as well as the recovery period. The use of the hysteroscope, resectoscope and the D&C portion were all discussed. The risks of surgery to include infection, prolonged antibiotics, hemorrhage necessitating transfusion and the risks of transfusion, including transfusion reaction, hepatitis, HIV, mad cow disease and other unknown entities were all discussed understood and accepted. The risk of damage to internal organs during the procedure, either immediately recognized or delay recognized, including vagina, cervix, uterus, possible perforation causing damage to bowel, bladder, ureters, vessels and nerves necessitating major exploratory reparative surgery and future reparative surgeries including bladder repair, ureteral damage repair, bowel resection, ostomy formation was also discussed understood and accepted. The potential for distended media absorption leading to metabolic complications such as fluid overload, coma and seizures was also discussed understood and accepted. She understands there are no guarantees that this will relieve her bleeding given that she is in the perimenopausal time frame. The patient's questions were answered to her satisfaction and she is ready to proceed with surgery.    [name] MD, 1:00 PM 11/02/2014

## 2014-11-08 MED ORDER — DEXTROSE 5 % IV SOLN
2.0000 g | INTRAVENOUS | Status: AC
  Administered 2014-11-09: 2 g via INTRAVENOUS
  Filled 2014-11-08: qty 2

## 2014-11-09 ENCOUNTER — Ambulatory Visit (HOSPITAL_COMMUNITY): Payer: BLUE CROSS/BLUE SHIELD | Admitting: Anesthesiology

## 2014-11-09 ENCOUNTER — Encounter (HOSPITAL_COMMUNITY): Payer: Self-pay | Admitting: Anesthesiology

## 2014-11-09 ENCOUNTER — Ambulatory Visit (HOSPITAL_COMMUNITY)
Admission: RE | Admit: 2014-11-09 | Discharge: 2014-11-09 | Disposition: A | Discharge status: Home / Self Care | Payer: BLUE CROSS/BLUE SHIELD | Source: Ambulatory Visit | Attending: Gynecology | Admitting: Gynecology

## 2014-11-09 ENCOUNTER — Encounter (HOSPITAL_COMMUNITY)
Admission: RE | Disposition: A | Discharge status: Home / Self Care | Payer: Self-pay | Source: Ambulatory Visit | Attending: Gynecology

## 2014-11-09 DIAGNOSIS — N952 Postmenopausal atrophic vaginitis: Secondary | ICD-10-CM | POA: Diagnosis not present

## 2014-11-09 DIAGNOSIS — N926 Irregular menstruation, unspecified: Secondary | ICD-10-CM | POA: Diagnosis not present

## 2014-11-09 DIAGNOSIS — D250 Submucous leiomyoma of uterus: Secondary | ICD-10-CM | POA: Diagnosis not present

## 2014-11-09 DIAGNOSIS — R938 Abnormal findings on diagnostic imaging of other specified body structures: Secondary | ICD-10-CM | POA: Diagnosis present

## 2014-11-09 DIAGNOSIS — Z853 Personal history of malignant neoplasm of breast: Secondary | ICD-10-CM | POA: Diagnosis not present

## 2014-11-09 DIAGNOSIS — R934 Abnormal findings on diagnostic imaging of urinary organs: Secondary | ICD-10-CM | POA: Diagnosis not present

## 2014-11-09 HISTORY — DX: Other specified postprocedural states: Z98.890

## 2014-11-09 HISTORY — DX: Other specified postprocedural states: R11.2

## 2014-11-09 HISTORY — PX: DILATATION & CURETTAGE/HYSTEROSCOPY WITH MYOSURE: SHX6511

## 2014-11-09 LAB — CBC
HCT: 35.1 % — ABNORMAL LOW (ref 36.0–46.0)
Hemoglobin: 12 g/dL (ref 12.0–15.0)
MCH: 30.8 pg (ref 26.0–34.0)
MCHC: 34.2 g/dL (ref 30.0–36.0)
MCV: 90 fL (ref 78.0–100.0)
Platelets: 274 10*3/uL (ref 150–400)
RBC: 3.9 MIL/uL (ref 3.87–5.11)
RDW: 13.5 % (ref 11.5–15.5)
WBC: 3.1 10*3/uL — ABNORMAL LOW (ref 4.0–10.5)

## 2014-11-09 LAB — TYPE AND SCREEN
ABO/RH(D): O POS
Antibody Screen: NEGATIVE

## 2014-11-09 LAB — ABO/RH: ABO/RH(D): O POS

## 2014-11-09 SURGERY — DILATATION & CURETTAGE/HYSTEROSCOPY WITH MYOSURE
Anesthesia: General | Site: Uterus

## 2014-11-09 MED ORDER — SCOPOLAMINE 1 MG/3DAYS TD PT72
1.0000 | MEDICATED_PATCH | Freq: Once | TRANSDERMAL | Status: DC
  Administered 2014-11-09: 1.5 mg via TRANSDERMAL

## 2014-11-09 MED ORDER — LIDOCAINE HCL (CARDIAC) 20 MG/ML IV SOLN
INTRAVENOUS | Status: DC | PRN
  Administered 2014-11-09: 50 mg via INTRAVENOUS

## 2014-11-09 MED ORDER — FENTANYL CITRATE (PF) 100 MCG/2ML IJ SOLN
INTRAMUSCULAR | Status: DC | PRN
  Administered 2014-11-09: 100 ug via INTRAVENOUS

## 2014-11-09 MED ORDER — LIDOCAINE HCL 1 % IJ SOLN
INTRAMUSCULAR | Status: DC | PRN
  Administered 2014-11-09: 10 mL

## 2014-11-09 MED ORDER — DEXAMETHASONE SODIUM PHOSPHATE 4 MG/ML IJ SOLN: 8.0000 mg | Freq: Once | INTRAMUSCULAR | Status: DC | PRN

## 2014-11-09 MED ORDER — SODIUM CHLORIDE 0.9 % IR SOLN
Status: DC | PRN
  Administered 2014-11-09: 3000 mL via INTRAVESICAL

## 2014-11-09 MED ORDER — OXYCODONE-ACETAMINOPHEN 5-325 MG PO TABS: 1.0000 | ORAL_TABLET | ORAL | Status: DC | PRN

## 2014-11-09 MED ORDER — DEXAMETHASONE SODIUM PHOSPHATE 10 MG/ML IJ SOLN
INTRAMUSCULAR | Status: DC | PRN
  Administered 2014-11-09: 4 mg via INTRAVENOUS

## 2014-11-09 MED ORDER — KETOROLAC TROMETHAMINE 30 MG/ML IJ SOLN
INTRAMUSCULAR | Status: DC | PRN
  Administered 2014-11-09: 30 mg via INTRAVENOUS

## 2014-11-09 MED ORDER — ONDANSETRON HCL 4 MG/2ML IJ SOLN
INTRAMUSCULAR | Status: AC
  Filled 2014-11-09: qty 2

## 2014-11-09 MED ORDER — LACTATED RINGERS IV SOLN
INTRAVENOUS | Status: DC
  Administered 2014-11-09 (×2): via INTRAVENOUS

## 2014-11-09 MED ORDER — FENTANYL CITRATE (PF) 100 MCG/2ML IJ SOLN
INTRAMUSCULAR | Status: AC
  Filled 2014-11-09: qty 2

## 2014-11-09 MED ORDER — ONDANSETRON HCL 4 MG/2ML IJ SOLN: INTRAMUSCULAR | Status: DC | PRN

## 2014-11-09 MED ORDER — ACETAMINOPHEN 160 MG/5ML PO SOLN
650.0000 mg | Freq: Once | ORAL | Status: AC
  Administered 2014-11-09: 650 mg via ORAL

## 2014-11-09 MED ORDER — FENTANYL CITRATE (PF) 100 MCG/2ML IJ SOLN
INTRAMUSCULAR | Status: AC
  Administered 2014-11-09: 50 ug via INTRAVENOUS
  Filled 2014-11-09: qty 2

## 2014-11-09 MED ORDER — LIDOCAINE HCL (CARDIAC) 20 MG/ML IV SOLN
INTRAVENOUS | Status: AC
  Filled 2014-11-09: qty 5

## 2014-11-09 MED ORDER — MIDAZOLAM HCL 2 MG/2ML IJ SOLN
INTRAMUSCULAR | Status: DC | PRN
  Administered 2014-11-09: 2 mg via INTRAVENOUS

## 2014-11-09 MED ORDER — SCOPOLAMINE 1 MG/3DAYS TD PT72
MEDICATED_PATCH | TRANSDERMAL | Status: AC
  Administered 2014-11-09: 1.5 mg via TRANSDERMAL
  Filled 2014-11-09: qty 1

## 2014-11-09 MED ORDER — PROPOFOL 10 MG/ML IV BOLUS
INTRAVENOUS | Status: AC
  Filled 2014-11-09: qty 20

## 2014-11-09 MED ORDER — PROPOFOL 10 MG/ML IV BOLUS
INTRAVENOUS | Status: DC | PRN
  Administered 2014-11-09: 150 mg via INTRAVENOUS

## 2014-11-09 MED ORDER — GLYCOPYRROLATE 0.2 MG/ML IJ SOLN
INTRAMUSCULAR | Status: DC | PRN
  Administered 2014-11-09: 0.1 mg via INTRAVENOUS

## 2014-11-09 MED ORDER — FENTANYL CITRATE (PF) 250 MCG/5ML IJ SOLN
INTRAMUSCULAR | Status: AC
  Filled 2014-11-09: qty 10

## 2014-11-09 MED ORDER — LIDOCAINE HCL 1 % IJ SOLN
INTRAMUSCULAR | Status: AC
  Filled 2014-11-09: qty 20

## 2014-11-09 MED ORDER — MIDAZOLAM HCL 2 MG/2ML IJ SOLN
INTRAMUSCULAR | Status: AC
  Filled 2014-11-09: qty 2

## 2014-11-09 MED ORDER — FENTANYL CITRATE (PF) 100 MCG/2ML IJ SOLN
25.0000 ug | INTRAMUSCULAR | Status: DC | PRN
  Administered 2014-11-09 (×2): 50 ug via INTRAVENOUS

## 2014-11-09 MED ORDER — ACETAMINOPHEN 160 MG/5ML PO SOLN
ORAL | Status: AC
  Filled 2014-11-09: qty 20.3

## 2014-11-09 MED ORDER — MEPERIDINE HCL 25 MG/ML IJ SOLN: 6.2500 mg | INTRAMUSCULAR | Status: DC | PRN

## 2014-11-09 MED ORDER — KETOROLAC TROMETHAMINE 30 MG/ML IJ SOLN: 30.0000 mg | Freq: Once | INTRAMUSCULAR | Status: DC | PRN

## 2014-11-09 SURGICAL SUPPLY — 16 items
CATH ROBINSON RED A/P 16FR (CATHETERS) ×3 IMPLANT
CLOTH BEACON ORANGE TIMEOUT ST (SAFETY) ×3 IMPLANT
CONTAINER PREFILL 10% NBF 60ML (FORM) ×6 IMPLANT
DEVICE MYOSURE LITE (MISCELLANEOUS) ×2 IMPLANT
FILTER ARTHROSCOPY CONVERTOR (FILTER) ×3 IMPLANT
GLOVE BIO SURGEON STRL SZ7.5 (GLOVE) ×6 IMPLANT
GLOVE NEODERM STER SZ 7 (GLOVE) ×2 IMPLANT
GLOVE SURG SS PI 7.0 STRL IVOR (GLOVE) ×10 IMPLANT
GOWN STRL REUS W/TWL LRG LVL3 (GOWN DISPOSABLE) ×6 IMPLANT
PACK VAGINAL MINOR WOMEN LF (CUSTOM PROCEDURE TRAY) ×3 IMPLANT
PAD OB MATERNITY 4.3X12.25 (PERSONAL CARE ITEMS) ×3 IMPLANT
SEAL ROD LENS SCOPE MYOSURE (ABLATOR) ×3 IMPLANT
TOWEL OR 17X24 6PK STRL BLUE (TOWEL DISPOSABLE) ×6 IMPLANT
TUBING AQUILEX INFLOW (TUBING) ×3 IMPLANT
TUBING AQUILEX OUTFLOW (TUBING) ×3 IMPLANT
WATER STERILE IRR 1000ML POUR (IV SOLUTION) ×3 IMPLANT

## 2014-11-09 NOTE — Op Note (Signed)
Brandy [name] 07-29-1960 [id number]   Post Operative Note   Date of surgery:  11/09/2014  Pre Op Dx:  Irregular perimenopausal bleeding, thickened endometrial echo on ultrasound  Post Op Dx:  Irregular perimenopausal bleeding, submucous myoma  Procedure:  Hysteroscopy, D&C, Myosure submucous myomectomy  Surgeon:  [name]  Anesthesia:  General  EBL:  minimal  Distended media discrepancy:  50 cc saline  Complications:  None  Specimen:  #1 endometrial curetting #2 submucous myoma fragments to pathology  Findings: EUA:  External BUS vagina withmild atrophic changes. Cervix normal. Uterus normal size midline mobile anteverted. Adnexa without masses   Hysteroscopy:  Fundus, anterior/posterior uterine surfaces, lower uterine segment, endocervical canal, right/left tubal ostia all visualized. Small submucous myoma mid to lower anterior endometrial surface, resected in its entirety.   Procedure:  The patient was taken to the operating room, underwent general anesthesia, was placed in the low dorsal lithotomy position, the vagina/perineum was cleansed with Betadine solution, in and out Foley catheterization performed and an EUA performed. The timeout was performed by the surgical team. The patient was draped in the usual fashion and the cervix was visualized with a speculum, anterior lip grasped with a single-tooth tenaculum and a paracervical block was placed using 1% lidocaine, 10 cc total. The cervix was gently dilated to admit the smaller Myosure hysteroscope and hysteroscopy was performed with findings noted above. Using the Myosure Lite resectoscopic wand the submucous myoma was resected in its entirety to the level of the surrounding endometrium. The cavity pressure was lowered several times to allow extrusion of the remaining myoma into the cavity and allowing for complete resection. A gentle sharp curettage was then performed and both specimens were sent separately to pathology.  Repeat hysteroscopy showed an empty cavity with good distention and no evidence of perforation. The patient received intraoperative Toradol, the instruments were removed, the cervix inspected showing adequate hemostasis at both the tenaculum sites and the external os and the patient was awakened without difficulty and taken to the recovery room in good condition having tolerated the procedure well.     [name] MD, 2:00 PM 11/09/2014

## 2014-11-09 NOTE — Anesthesia Postprocedure Evaluation (Signed)
Anesthesia Post Note  Patient: Brandy [name]  Procedure(s) Performed: Procedure(s) (LRB): DILATATION & CURETTAGE/HYSTEROSCOPY WITH MYOSURE (N/A)  Anesthesia type: General  Patient location: PACU  Post pain: Pain level controlled  Post assessment: Post-op Vital signs reviewed  Last Vitals:  Filed Vitals:   11/09/14 1415  BP:   Pulse: 70  Temp:   Resp: 18    Post vital signs: Reviewed  Level of consciousness: sedated  Complications: No apparent anesthesia complications

## 2014-11-09 NOTE — Discharge Instructions (Signed)
° °  Postoperative Instructions Hysteroscopy D & C ° °Dr. Summers Buendia and the nursing staff have discussed postoperative instructions with you.  If you have any questions please ask them before you leave the hospital, or call Dr Fuquan Wilson’s office at [phone].   ° °We would like to emphasize the following instructions: ° ° °? Call the office to make your follow-up appointment as recommended by Dr Jori Thrall (usually 1-2 weeks). ° °? You were given a prescription, or one was ordered for you at the pharmacy you designated.  Get that prescription filled and take the medication according to instructions. ° °? You may eat a regular diet, but slowly until you start having bowel movements. ° °? Drink plenty of water daily. ° °? Nothing in the vagina (intercourse, douching, objects of any kind) for two weeks.  When reinitiating intercourse, if it is uncomfortable, stop and make an appointment with Dr Bobbe Quilter to be evaluated. ° °? No driving for one to two days until the effects of anesthesia has worn off.  No traveling out of town for several days. ° °? You may shower, but no baths for one week.  Walking up and down stairs is ok.  No heavy lifting, prolonged standing, repeated bending or any “working out” until your post op check. ° °? Rest frequently, listen to your body and do not push yourself and overdo it. ° °? Call if: ° °o Your pain medication does not seem strong enough. °o Worsening pain or abdominal bloating °o Persistent nausea or vomiting °o Difficulty with urination or bowel movements. °o Temperature of 101 degrees or higher. °o Heavy vaginal bleeding.  If your period is due, you may use tampons. °o You have any questions or concerns ° ° ° °

## 2014-11-09 NOTE — Transfer of Care (Signed)
Immediate Anesthesia Transfer of Care Note  Patient: Brandy [name]  Procedure(s) Performed: Procedure(s) with comments: [place] (N/A) - 1:00pm OR time requested.  one hour needed  does not need Myosure rep available.  Patient Location: PACU  Anesthesia Type:General  Level of Consciousness: awake, alert  and oriented  Airway & Oxygen Therapy: Patient Spontanous Breathing and Patient connected to nasal cannula oxygen  Post-op Assessment: Report given to RN and Post -op Vital signs reviewed and stable  Post vital signs: Reviewed and stable  Last Vitals:  Filed Vitals:   11/09/14 1139  BP: 147/88  Pulse: 70  Temp: 36.7 C  Resp: 20    Complications: No apparent anesthesia complications

## 2014-11-09 NOTE — Anesthesia Procedure Notes (Signed)
Procedure Name: LMA Insertion Date/Time: 11/09/2014 1:07 PM Performed by: Jonna Munro Pre-anesthesia Checklist: Emergency Drugs available, Patient identified, Patient being monitored, Timeout performed and Suction available Patient Re-evaluated:Patient Re-evaluated prior to inductionOxygen Delivery Method: Circle system utilized Preoxygenation: Pre-oxygenation with 100% oxygen Intubation Type: IV induction LMA: LMA inserted LMA Size: 4.0 Number of attempts: 1 Dental Injury: Teeth and Oropharynx as per pre-operative assessment

## 2014-11-09 NOTE — Anesthesia Preprocedure Evaluation (Signed)
Anesthesia Evaluation  Patient identified by MRN, date of birth, ID band Patient awake    Reviewed: Allergy & Precautions, H&P , NPO status , Patient's Chart, lab work & pertinent test results  History of Anesthesia Complications (+) PONV and history of anesthetic complications  Airway Mallampati: I  TM Distance: >3 FB Neck ROM: full    Dental no notable dental hx. (+) Teeth Intact   Pulmonary    Pulmonary exam normal       Cardiovascular negative cardio ROS Normal cardiovascular exam    Neuro/Psych negative psych ROS   GI/Hepatic Neg liver ROS,   Endo/Other    Renal/GU negative Renal ROS     Musculoskeletal   Abdominal Normal abdominal exam  (+)   Peds  Hematology negative hematology ROS (+)   Anesthesia Other Findings   Reproductive/Obstetrics negative OB ROS                             Anesthesia Physical Anesthesia Plan  ASA: II  Anesthesia Plan: General   Post-op Pain Management:    Induction: Intravenous  Airway Management Planned:   Additional Equipment:   Intra-op Plan:   Post-operative Plan:   Informed Consent: I have reviewed the patients History and Physical, chart, labs and discussed the procedure including the risks, benefits and alternatives for the proposed anesthesia with the patient or authorized representative who has indicated his/her understanding and acceptance.     Plan Discussed with: CRNA and Surgeon  Anesthesia Plan Comments:         Anesthesia Quick Evaluation

## 2014-11-09 NOTE — H&P (Signed)
  The patient was examined.  I reviewed the proposed surgery and consent form with the patient.  The dictated history and physical is current and accurate and all questions were answered. The patient is ready to proceed with surgery and has a realistic understanding and expectation for the outcome.   [name] MD, 12:49 PM 11/09/2014

## 2014-11-11 ENCOUNTER — Encounter (HOSPITAL_COMMUNITY): Payer: Self-pay | Admitting: Gynecology

## 2014-11-21 ENCOUNTER — Ambulatory Visit (INDEPENDENT_AMBULATORY_CARE_PROVIDER_SITE_OTHER): Payer: BLUE CROSS/BLUE SHIELD | Admitting: Gynecology

## 2014-11-21 ENCOUNTER — Encounter: Payer: Self-pay | Admitting: Gynecology

## 2014-11-21 VITALS — BP 118/76

## 2014-11-21 DIAGNOSIS — Z9889 Other specified postprocedural states: Secondary | ICD-10-CM

## 2014-11-21 NOTE — Patient Instructions (Signed)
Follow up in March 2017 when due for your annual exam. Sooner if any issues.

## 2014-11-21 NOTE — Progress Notes (Signed)
Brandy [name] 26-Nov-1960 [id number]        54 y.o.  [id number] Presents for her postoperative visit status post hysteroscopic resection of submucous myoma. Has done well postoperatively. Is no longer bleeding.  Past medical history,surgical history, problem list, medications, allergies, family history and social history were all reviewed and documented in the EPIC chart.  Directed ROS with pertinent positives and negatives documented in the history of present illness/assessment and plan.  Exam: Brandy [name] assistant Filed Vitals:   11/21/14 1340  BP: 118/76   General appearance:  Normal Abdomen soft nontender without masses guarding rebound Pelvic external BUS vagina normal. Cervix normal. Uterus normal size midline mobile nontender. Adnexa without masses or tenderness.  Assessment/Plan:  54 y.o. [id number] with normal postoperative check status post hysteroscopic submucous myoma resection. Reviewed benign pathology with her and pictures from the surgery. Patient will keep menstrual calendar and as long as no further bleeding the patient will follow up in March 2017 when due for annual exam, sooner if needed.    Brandy Auerbach MD, 1:51 PM 11/21/2014

## 2014-11-23 ENCOUNTER — Other Ambulatory Visit: Payer: Self-pay | Admitting: *Deleted

## 2014-11-23 DIAGNOSIS — C50911 Malignant neoplasm of unspecified site of right female breast: Secondary | ICD-10-CM

## 2014-11-23 DIAGNOSIS — C50912 Malignant neoplasm of unspecified site of left female breast: Principal | ICD-10-CM

## 2014-11-24 ENCOUNTER — Ambulatory Visit (HOSPITAL_BASED_OUTPATIENT_CLINIC_OR_DEPARTMENT_OTHER): Payer: BLUE CROSS/BLUE SHIELD | Admitting: Hematology and Oncology

## 2014-11-24 ENCOUNTER — Other Ambulatory Visit (HOSPITAL_BASED_OUTPATIENT_CLINIC_OR_DEPARTMENT_OTHER): Payer: BLUE CROSS/BLUE SHIELD

## 2014-11-24 ENCOUNTER — Telehealth: Payer: Self-pay | Admitting: Hematology and Oncology

## 2014-11-24 ENCOUNTER — Encounter: Payer: Self-pay | Admitting: Hematology and Oncology

## 2014-11-24 DIAGNOSIS — C50911 Malignant neoplasm of unspecified site of right female breast: Secondary | ICD-10-CM

## 2014-11-24 DIAGNOSIS — Z79810 Long term (current) use of selective estrogen receptor modulators (SERMs): Secondary | ICD-10-CM

## 2014-11-24 DIAGNOSIS — C50812 Malignant neoplasm of overlapping sites of left female breast: Secondary | ICD-10-CM

## 2014-11-24 DIAGNOSIS — C50912 Malignant neoplasm of unspecified site of left female breast: Principal | ICD-10-CM

## 2014-11-24 DIAGNOSIS — Z853 Personal history of malignant neoplasm of breast: Secondary | ICD-10-CM

## 2014-11-24 LAB — COMPREHENSIVE METABOLIC PANEL (CC13)
ALT: 18 U/L (ref 0–55)
AST: 19 U/L (ref 5–34)
Albumin: 4.2 g/dL (ref 3.5–5.0)
Alkaline Phosphatase: 66 U/L (ref 40–150)
Anion Gap: 10 mEq/L (ref 3–11)
BUN: 17.6 mg/dL (ref 7.0–26.0)
CO2: 24 mEq/L (ref 22–29)
Calcium: 9.7 mg/dL (ref 8.4–10.4)
Chloride: 105 mEq/L (ref 98–109)
Creatinine: 0.7 mg/dL (ref 0.6–1.1)
EGFR: >90 mL/min/{1.73_m2} (ref >90)
Glucose: 83 mg/dl (ref 70–140)
Potassium: 3.9 mEq/L (ref 3.5–5.1)
Sodium: 139 mEq/L (ref 136–145)
Total Bilirubin: 0.63 mg/dL (ref 0.20–1.20)
Total Protein: 7.2 g/dL (ref 6.4–8.3)

## 2014-11-24 LAB — CBC WITH DIFFERENTIAL/PLATELET
BASO%: 1 % (ref 0.0–2.0)
Basophils Absolute: 0 10*3/uL (ref 0.0–0.1)
EOS%: 1.3 % (ref 0.0–7.0)
Eosinophils Absolute: 0.1 10*3/uL (ref 0.0–0.5)
HCT: 37.2 % (ref 34.8–46.6)
HGB: 12.7 g/dL (ref 11.6–15.9)
LYMPH%: 33.1 % (ref 14.0–49.7)
MCH: 30.5 pg (ref 25.1–34.0)
MCHC: 34.1 g/dL (ref 31.5–36.0)
MCV: 89.2 fL (ref 79.5–101.0)
MONO#: 0.3 10*3/uL (ref 0.1–0.9)
MONO%: 8.5 % (ref 0.0–14.0)
NEUT#: 2.2 10*3/uL (ref 1.5–6.5)
NEUT%: 56.1 % (ref 38.4–76.8)
Platelets: 240 10*3/uL (ref 145–400)
RBC: 4.17 10*6/uL (ref 3.70–5.45)
RDW: 12.6 % (ref 11.2–14.5)
WBC: 4 10*3/uL (ref 3.9–10.3)
lymph#: 1.3 10*3/uL (ref 0.9–3.3)

## 2014-11-24 MED ORDER — PROMETHAZINE HCL 25 MG PO TABS: 25.0000 mg | ORAL_TABLET | Freq: Four times a day (QID) | ORAL | Status: DC | PRN

## 2014-11-24 NOTE — Progress Notes (Signed)
Patient Care Team: Reynold Bowen, MD as PCP - General (Endocrinology) [name], MD as Consulting Physician (Gynecology) Autumn Messing III, MD as Consulting Physician (General Surgery) Nicholas Lose, MD as Consulting Physician (Hematology and Oncology)  DIAGNOSIS: Bilateral breast cancer   Staging form: Breast, AJCC 7th Edition     Clinical: Stage IA (T1c, N0, cM0) - Unsigned       Staging comments: Staged at breast conference on 01/18/14.      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   01/20/2014 Procedure Left breast 6:00 position biopsy: IDC with DCIS ER 100%, PR 45%, Ki-67 5%, HER-2 negative ratio 1.13; clinical T1 B. N0 M0 stage IA   02/06/2014 Procedure BRCA1 and 2 are negative, Breast Next panel Testing was normal and did not reveal any clearly harmful mutation in these genes. The genes tested were ATM, BARD1, BRCA1, BRCA2, BRIP1, CDH1, CHEK2, MRE11A, MUTYH, NBN, NF1, PALB2, PTEN, RAD50, RAD51C, RAD51D,   04/26/2014 Surgery Bilateral mastectomies with reconstruction, right breast 1.2 cm, ER 100%, PR 100%, HER-2 negative ratio 0.9, 2 sentinel nodes negative T1 cN0 M0 stage IA, left breast 0.8 cm ER/PR positive HER-2 -3 sentinel nodes negative T1b N0 M0 stage IA   05/26/2014 -  Anti-estrogen oral therapy Tamoxifen 20 mg daily    CHIEF COMPLIANT:  Follow-up on tamoxifen therapy  INTERVAL HISTORY: Brandy [name] is a 54 year old with above-mentioned history of bilateral mastectomies currently undergoing breast reconstruction at [facility]. She is also on antiestrogen therapy with tamoxifen. She PS to be tolerating tamoxifen fairly well. She is mainly frustrated about her travel to [facility] and back which takes a lot of toll on her. Especially she feels nauseated after their procedures. She has been throwing up every single time. She is requesting antinausea drug from me today. Denies any hot flashes or myalgias related to tamoxifen. Recently she underwent endometrial D&C for heavy uterine bleeding which she has had for the past 4-5 months that were attributable to thickening of lining of the endometrium along with uterine fibroid. Since the D&C her bleeding has resolved significantly. She has not had a regular period since then.  REVIEW OF SYSTEMS:   Constitutional: Denies fevers, chills or abnormal weight loss Eyes: Denies blurriness of vision Ears, nose, mouth, throat, and face: Denies mucositis or sore throat Respiratory: Denies cough, dyspnea or wheezes Cardiovascular: Denies palpitation, chest discomfort or lower extremity swelling Gastrointestinal:  Denies nausea, heartburn or change in bowel habits Skin: Denies abnormal skin rashes Lymphatics: Denies new lymphadenopathy or easy bruising Neurological:Denies numbness, tingling or new weaknesses Behavioral/Psych: Mood is stable, no new changes  All other systems were reviewed with the patient and are negative.  I have reviewed the past medical history, past surgical history, social history and family history with the patient and they are unchanged from previous note.  ALLERGIES:  is allergic to zofran.  MEDICATIONS:  Current Outpatient Prescriptions  Medication Sig Dispense Refill  . acetaminophen (TYLENOL) 325 MG tablet Take 650 mg by mouth every 6 (six) hours  as needed for mild pain, moderate pain or headache (migraines).    Marland Kitchen albuterol (PROVENTIL HFA;VENTOLIN HFA) 108 (90 BASE) MCG/ACT inhaler Inhale 2 puffs into the lungs every 6 (six) hours as needed for wheezing or shortness of breath (asthma).    . ALPRAZolam (XANAX) 0.5 MG tablet Take 0.5 mg by mouth 4 (four) times daily as needed for anxiety or sleep. Take 1/2 to 1 tablet daily as needed for acute anxiety/insomnia    . tamoxifen (NOLVADEX) 20 MG tablet Take 1 tablet (20 mg total) by mouth daily. 90 tablet 3  . promethazine (PHENERGAN) 25 MG tablet Take 1 tablet (25 mg total) by mouth every 6 (six) hours as needed for nausea or vomiting. 30 tablet 0   No current facility-administered medications for this visit.    PHYSICAL EXAMINATION: ECOG PERFORMANCE STATUS: 1 - Symptomatic but completely ambulatory  Filed Vitals:   11/24/14 1130  BP: 164/98  Pulse: 71  Temp: 97.6 F (36.4 C)  Resp: 19   Filed Weights   11/24/14 1130  Weight: 118 lb 1.6 oz (53.57 kg)    GENERAL:alert, no distress and comfortable SKIN: skin color, texture, turgor are normal, no rashes or significant lesions EYES: normal, Conjunctiva are pink and non-injected, sclera clear OROPHARYNX:no exudate, no erythema and lips, buccal mucosa, and tongue normal  NECK: supple, thyroid normal size, non-tender, without nodularity LYMPH:  no palpable lymphadenopathy in the cervical, axillary or inguinal LUNGS: clear to auscultation and percussion with normal breathing effort HEART: regular rate & rhythm and no murmurs and no lower extremity edema ABDOMEN:abdomen soft, non-tender and normal bowel sounds Musculoskeletal:no cyanosis of digits and no clubbing  NEURO: alert & oriented x 3 with fluent speech, no focal motor/sensory deficits   LABORATORY DATA:  I have reviewed the data as listed   Chemistry      Component Value Date/Time   NA 139 11/24/2014 1114   NA 141 09/20/2014 1511   K 3.9 11/24/2014 1114   K 3.7  09/20/2014 1511   CL 105 09/20/2014 1511   CO2 24 11/24/2014 1114   CO2 28 09/20/2014 1511   BUN 17.6 11/24/2014 1114   BUN 13 09/20/2014 1511   CREATININE 0.7 11/24/2014 1114   CREATININE 0.57 09/20/2014 1511   CREATININE 0.60 05/10/2014 2005      Component Value Date/Time   CALCIUM 9.7 11/24/2014 1114   CALCIUM 9.1 09/20/2014 1511   ALKPHOS 66 11/24/2014 1114   ALKPHOS 52 09/20/2014 1511   AST 19 11/24/2014 1114   AST 17 09/20/2014 1511   ALT 18 11/24/2014 1114   ALT 15 09/20/2014 1511   BILITOT 0.63 11/24/2014 1114   BILITOT 0.6 09/20/2014 1511       Lab Results  Component Value Date   WBC 4.0 11/24/2014   HGB 12.7 11/24/2014   HCT 37.2 11/24/2014   MCV 89.2 11/24/2014   PLT 240 11/24/2014   NEUTROABS 2.2 11/24/2014   ASSESSMENT & PLAN:  Bilateral breast cancer 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%, PR 100%, HER-2 negative Pathological stage: T1 cN0 M0 ER 100%, PR 100%, HER-2 negative ratio 0.9, margins negative, 2 SLN negative  Left breast: Clinical T1 BN 0 M0 invasive ductal carcinoma ER positive PR 45%, HER-2 negative Pathologic stage: T1b N0 M0  ER 100% PR 45% HER-2 negative ratio 1.1 margins clear, 3 sentinel nodes negative Oncotype DX recurrence score 16, 10% risk of recurrence  Current treatment: Tamoxifen 20 mg daily started January 2016 plan is to continue this until she is postmenopausal and then switch her to aromatase inhibitor therapy 5-10 years  Tamoxifen toxicities: 1. Fatigue 2. Mild hot flashes 3. Muscle aches  Patient is planning to get breast reconstruction completed later this month at [facility]. Return to clinic in 6 months and we will check [facility] and estradiol prior to her visit.       Orders Placed This Encounter  Procedures  . Follicle stimulating hormone    Standing Status: Future     Number of Occurrences:      Standing Expiration Date: 12/29/2015  . Estradiol, Ultra Sens    Standing  Status: Future     Number of Occurrences:      Standing Expiration Date: 11/24/2015  . CBC with Differential    Standing Status: Future     Number of Occurrences:      Standing Expiration Date: 11/24/2015  . Comprehensive metabolic panel (Cmet) - [facility]    Standing Status: Future     Number of Occurrences:      Standing Expiration Date: 11/24/2015   The patient has a good understanding of the overall plan. she agrees with it. she will call with any problems that may develop before the next visit here.   Rulon Eisenmenger, MD

## 2014-11-24 NOTE — Assessment & Plan Note (Signed)
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%, PR 100%, HER-2 negative Pathological stage: T1 cN0 M0 ER 100%, PR 100%, HER-2 negative ratio 0.9, margins negative, 2 SLN negative  Left breast: Clinical T1 BN 0 M0 invasive ductal carcinoma ER positive PR 45%, HER-2 negative Pathologic stage: T1b N0 M0 ER 100% PR 45% HER-2 negative ratio 1.1 margins clear, 3 sentinel nodes negative Oncotype DX recurrence score 16, 10% risk of recurrence  Current treatment: Tamoxifen 20 mg daily started January 2016 plan is to continue this until she is postmenopausal and then switch her to aromatase inhibitor therapy 5-10 years  Tamoxifen toxicities: 1. Fatigue 2. Mild hot flashes 3. Muscle aches  Patient is planning to get breast reconstruction completed later this month at [facility]. Return to clinic in 6 months and we will check [facility] and estradiol prior to her visit.

## 2014-11-24 NOTE — Telephone Encounter (Signed)
Gave and printed appt sched and avs fo rpt for Jan °

## 2015-01-26 ENCOUNTER — Other Ambulatory Visit: Payer: BLUE CROSS/BLUE SHIELD

## 2015-01-26 DIAGNOSIS — E559 Vitamin D deficiency, unspecified: Secondary | ICD-10-CM

## 2015-01-26 DIAGNOSIS — R748 Abnormal levels of other serum enzymes: Secondary | ICD-10-CM

## 2015-01-26 LAB — HEPATIC FUNCTION PANEL
ALT: 11 U/L (ref 6–29)
AST: 16 U/L (ref 10–35)
Albumin: 4 g/dL (ref 3.6–5.1)
Alkaline Phosphatase: 51 U/L (ref 33–130)
Bilirubin, Direct: 0.1 mg/dL (ref <=0.2)
Indirect Bilirubin: 0.4 mg/dL (ref 0.2–1.2)
Total Bilirubin: 0.5 mg/dL (ref 0.2–1.2)
Total Protein: 6.3 g/dL (ref 6.1–8.1)

## 2015-01-28 LAB — VITAMIN D 1,25 DIHYDROXY
Vitamin D 1, 25 (OH)2 Total: 38 pg/mL (ref 18–72)
Vitamin D2 1, 25 (OH)2: <8 pg/mL
Vitamin D3 1, 25 (OH)2: 38 pg/mL

## 2015-03-29 ENCOUNTER — Ambulatory Visit (INDEPENDENT_AMBULATORY_CARE_PROVIDER_SITE_OTHER): Payer: BLUE CROSS/BLUE SHIELD | Admitting: Gynecology

## 2015-03-29 ENCOUNTER — Encounter: Payer: Self-pay | Admitting: Gynecology

## 2015-03-29 VITALS — BP 120/78

## 2015-03-29 DIAGNOSIS — C50911 Malignant neoplasm of unspecified site of right female breast: Secondary | ICD-10-CM

## 2015-03-29 DIAGNOSIS — C50912 Malignant neoplasm of unspecified site of left female breast: Secondary | ICD-10-CM

## 2015-03-29 DIAGNOSIS — R2231 Localized swelling, mass and lump, right upper limb: Secondary | ICD-10-CM

## 2015-03-29 NOTE — Patient Instructions (Signed)
Follow up routinely as needed

## 2015-03-29 NOTE — Progress Notes (Signed)
Brandy [name] [birth year] [id number]        54 y.o.  [id number] Patient presents questioning whether she feels something under her right axilla.  Noticed this while bathing. Nontender.  Initially was tender but now is not and she feels that it's actually getting smaller.  Past medical history,surgical history, problem list, medications, allergies, family history and social history were all reviewed and documented in the EPIC chart.  Directed ROS with pertinent positives and negatives documented in the history of present illness/assessment and plan.  Exam: [name] assistant Filed Vitals:   03/29/15 1043  BP: 120/78   General appearance:  Normal Patient examined lying and sitting with bilateral mastectomies with reconstruction implants. Scars well healed. No masses or axillary adenopathy. The area she's pointing to in the right posterior axillary line which feels be a prominence of the muscle fibers but no definitive masses. No overlying skin changes.  Assessment/Plan:  54 y.o. [id number] With fibrous feeling area within the muscle fibers of the posterior axillary line. Does not feel to be masses such as adenopathy or skin cysts.  Agent actually feels it is getting smaller since she first noticed it.   Recommend patient follow with self exams and as long as it remains stable or resolves them will follow expectantly. If enlarges then she knows to call pursue more bald evaluation.    [name] MD, 11:07 AM 03/29/2015

## 2015-04-01 ENCOUNTER — Other Ambulatory Visit: Payer: Self-pay | Admitting: Hematology and Oncology

## 2015-04-02 ENCOUNTER — Other Ambulatory Visit: Payer: Self-pay | Admitting: *Deleted

## 2015-04-02 DIAGNOSIS — C50911 Malignant neoplasm of unspecified site of right female breast: Secondary | ICD-10-CM

## 2015-04-02 DIAGNOSIS — C50912 Malignant neoplasm of unspecified site of left female breast: Principal | ICD-10-CM

## 2015-04-02 MED ORDER — TAMOXIFEN CITRATE 20 MG PO TABS: 20.0000 mg | ORAL_TABLET | Freq: Every day | ORAL | Status: DC

## 2015-05-25 ENCOUNTER — Other Ambulatory Visit (HOSPITAL_BASED_OUTPATIENT_CLINIC_OR_DEPARTMENT_OTHER): Payer: BLUE CROSS/BLUE SHIELD

## 2015-05-25 ENCOUNTER — Other Ambulatory Visit: Payer: Self-pay

## 2015-05-25 DIAGNOSIS — C50911 Malignant neoplasm of unspecified site of right female breast: Secondary | ICD-10-CM

## 2015-05-25 DIAGNOSIS — C50912 Malignant neoplasm of unspecified site of left female breast: Secondary | ICD-10-CM

## 2015-05-25 DIAGNOSIS — Z853 Personal history of malignant neoplasm of breast: Secondary | ICD-10-CM | POA: Diagnosis not present

## 2015-05-25 LAB — CBC WITH DIFFERENTIAL/PLATELET
BASO%: 1.1 % (ref 0.0–2.0)
Basophils Absolute: 0 10*3/uL (ref 0.0–0.1)
EOS%: 1 % (ref 0.0–7.0)
Eosinophils Absolute: 0 10*3/uL (ref 0.0–0.5)
HCT: 43.7 % (ref 34.8–46.6)
HGB: 14.2 g/dL (ref 11.6–15.9)
LYMPH%: 19.3 % (ref 14.0–49.7)
MCH: 29.3 pg (ref 25.1–34.0)
MCHC: 32.6 g/dL (ref 31.5–36.0)
MCV: 90.1 fL (ref 79.5–101.0)
MONO#: 0.2 10*3/uL (ref 0.1–0.9)
MONO%: 5.3 % (ref 0.0–14.0)
NEUT#: 3.1 10*3/uL (ref 1.5–6.5)
NEUT%: 73.3 % (ref 38.4–76.8)
Platelets: 276 10*3/uL (ref 145–400)
RBC: 4.85 10*6/uL (ref 3.70–5.45)
RDW: 12.8 % (ref 11.2–14.5)
WBC: 4.3 10*3/uL (ref 3.9–10.3)
lymph#: 0.8 10*3/uL — ABNORMAL LOW (ref 0.9–3.3)

## 2015-05-25 LAB — COMPREHENSIVE METABOLIC PANEL
ALT: 17 U/L (ref 0–55)
AST: 21 U/L (ref 5–34)
Albumin: 4.3 g/dL (ref 3.5–5.0)
Alkaline Phosphatase: 70 U/L (ref 40–150)
Anion Gap: 10 mEq/L (ref 3–11)
BUN: 14.1 mg/dL (ref 7.0–26.0)
CO2: 24 mEq/L (ref 22–29)
Calcium: 9.7 mg/dL (ref 8.4–10.4)
Chloride: 106 mEq/L (ref 98–109)
Creatinine: 0.9 mg/dL (ref 0.6–1.1)
EGFR: 75 mL/min/{1.73_m2} — ABNORMAL LOW (ref >90)
Glucose: 91 mg/dl (ref 70–140)
Potassium: 4 mEq/L (ref 3.5–5.1)
Sodium: 139 mEq/L (ref 136–145)
Total Bilirubin: 0.69 mg/dL (ref 0.20–1.20)
Total Protein: 7.7 g/dL (ref 6.4–8.3)

## 2015-05-26 LAB — FOLLICLE STIMULATING HORMONE: FSH: 62.6 m[IU]/mL

## 2015-05-29 LAB — ESTRADIOL, ULTRA SENS: Estradiol, Ultra Sensitive: 203 pg/mL

## 2015-05-31 NOTE — Assessment & Plan Note (Signed)
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%, PR 100%, HER-2 negative Pathological stage: T1 cN0 M0 ER 100%, PR 100%, HER-2 negative ratio 0.9, margins negative, 2 SLN negative  Left breast: Clinical T1 BN 0 M0 invasive ductal carcinoma ER positive PR 45%, HER-2 negative Pathologic stage: T1b N0 M0 ER 100% PR 45% HER-2 negative ratio 1.1 margins clear, 3 sentinel nodes negative Oncotype DX recurrence score 16, 10% risk of recurrence  Current treatment: Tamoxifen 20 mg daily started January 2016 plan is to continue this until she is postmenopausal and then switch her to aromatase inhibitor therapy 5-10 years  Tamoxifen toxicities: 1. Fatigue 2. Mild hot flashes 3. Muscle aches  Return to clinic in 6 months and we will check [facility] and estradiol prior to her visit.

## 2015-06-01 ENCOUNTER — Telehealth: Payer: Self-pay | Admitting: Hematology and Oncology

## 2015-06-01 ENCOUNTER — Ambulatory Visit (HOSPITAL_BASED_OUTPATIENT_CLINIC_OR_DEPARTMENT_OTHER): Payer: BLUE CROSS/BLUE SHIELD | Admitting: Hematology and Oncology

## 2015-06-01 ENCOUNTER — Encounter: Payer: Self-pay | Admitting: Hematology and Oncology

## 2015-06-01 VITALS — BP 134/80 | HR 75 | Temp 97.6°F | Resp 18 | Ht 65.0 in | Wt 113.4 lb

## 2015-06-01 DIAGNOSIS — C50411 Malignant neoplasm of upper-outer quadrant of right female breast: Secondary | ICD-10-CM | POA: Diagnosis not present

## 2015-06-01 DIAGNOSIS — C50412 Malignant neoplasm of upper-outer quadrant of left female breast: Secondary | ICD-10-CM | POA: Diagnosis not present

## 2015-06-01 NOTE — Progress Notes (Signed)
Patient Care Team: Reynold Bowen, MD as PCP - General (Endocrinology) [name], MD as Consulting Physician (Gynecology) Autumn Messing III, MD as Consulting Physician (General Surgery) Nicholas Lose, MD as Consulting Physician (Hematology and Oncology)  DIAGNOSIS: Bilateral breast cancer [facility])   Staging form: Breast, AJCC 7th Edition     Clinical: Stage IA (T1c, N0, cM0) - Unsigned       Staging comments: Staged at breast conference on 01/18/14.      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   01/20/2014 Procedure Left breast 6:00 position biopsy: IDC with DCIS ER 100%, PR 45%, Ki-67 5%, HER-2 negative ratio 1.13; clinical T1 B. N0 M0 stage IA   02/06/2014 Procedure BRCA1 and 2 are negative, Breast Next panel Testing was normal and did not reveal any clearly harmful mutation in these genes. The genes tested were ATM, BARD1, BRCA1, BRCA2, BRIP1, CDH1, CHEK2, MRE11A, MUTYH, NBN, NF1, PALB2, PTEN, RAD50, RAD51C, RAD51D,   04/26/2014 Surgery Bilateral mastectomies with reconstruction, right breast 1.2 cm, ER 100%, PR 100%, HER-2 negative ratio 0.9, 2 sentinel nodes negative T1 cN0 M0 stage IA, left breast 0.8 cm ER/PR positive HER-2 -3 sentinel nodes negative T1b N0 M0 stage IA   05/26/2014 -  Anti-estrogen oral therapy Tamoxifen 20 mg daily    CHIEF  COMPLIANT: follow-up on tamoxifen  INTERVAL HISTORY: Brandy [name] is a 55 year old with above-mentioned history of bilateral mastectomies with reconstruction for lateral breast cancers. She is still undergoing reconstructive surgery. She tells me that tamoxifen is making her very fatigued and she does not like the way it feels. It appears that she has been trying to take half a tablet or none at all. Her daughters have been forcing her to take it. She has been experiencing menopausal symptoms and she does not like the weight makes her feel.she is very excited that she is becoming a grandmother.  REVIEW OF SYSTEMS:   Constitutional: Denies fevers, chills or abnormal weight loss Eyes: Denies blurriness of vision Ears, nose, mouth, throat, and face: Denies mucositis or sore throat Respiratory: Denies cough, dyspnea or wheezes Cardiovascular: Denies palpitation, chest discomfort Gastrointestinal:  Denies nausea, heartburn or change in bowel habits Skin: Denies abnormal skin rashes Lymphatics: Denies new lymphadenopathy or easy bruising Neurological:Denies numbness, tingling or new weaknesses Behavioral/Psych: Mood is stable, no new changes  Extremities: No lower extremity edema Breast: bilateral breast reconstruction All other systems were reviewed with the patient and are negative.  I have reviewed the past medical history, past surgical history, social history and family history with the patient and they are unchanged from previous note.  ALLERGIES:  is allergic to zofran.  MEDICATIONS:  Current Outpatient Prescriptions  Medication Sig Dispense Refill  . albuterol (PROVENTIL HFA;VENTOLIN HFA) 108 (90 BASE) MCG/ACT inhaler Inhale 2 puffs into the lungs every 6 (six) hours as needed  for wheezing or shortness of breath (asthma).    . ALPRAZolam (XANAX) 0.5 MG tablet Take 0.5 mg by mouth 4 (four) times daily as needed for anxiety or sleep. Take 1/2 to 1 tablet daily as needed for acute  anxiety/insomnia    . tamoxifen (NOLVADEX) 20 MG tablet Take 1 tablet (20 mg total) by mouth daily. 90 tablet 3   No current facility-administered medications for this visit.    PHYSICAL EXAMINATION: ECOG PERFORMANCE STATUS: 1 - Symptomatic but completely ambulatory  Filed Vitals:   06/01/15 1109  BP: 134/80  Pulse: 75  Temp: 97.6 F (36.4 C)  Resp: 18   Filed Weights   06/01/15 1109  Weight: 113 lb 6.4 oz (51.438 kg)    GENERAL:alert, no distress and comfortable SKIN: skin color, texture, turgor are normal, no rashes or significant lesions EYES: normal, Conjunctiva are pink and non-injected, sclera clear OROPHARYNX:no exudate, no erythema and lips, buccal mucosa, and tongue normal  NECK: supple, thyroid normal size, non-tender, without nodularity LYMPH:  no palpable lymphadenopathy in the cervical, axillary or inguinal LUNGS: clear to auscultation and percussion with normal breathing effort HEART: regular rate & rhythm and no murmurs and no lower extremity edema ABDOMEN:abdomen soft, non-tender and normal bowel sounds MUSCULOSKELETAL:no cyanosis of digits and no clubbing  NEURO: alert & oriented x 3 with fluent speech, no focal motor/sensory deficits EXTREMITIES: No lower extremity edema BREAST:bilateral breast reconstructive surgery no palpable lumps or nodules. (exam performed in the presence of a chaperone)  LABORATORY DATA:  I have reviewed the data as listed   Chemistry      Component Value Date/Time   NA 139 05/25/2015 1125   NA 141 09/20/2014 1511   K 4.0 05/25/2015 1125   K 3.7 09/20/2014 1511   CL 105 09/20/2014 1511   CO2 24 05/25/2015 1125   CO2 28 09/20/2014 1511   BUN 14.1 05/25/2015 1125   BUN 13 09/20/2014 1511   CREATININE 0.9 05/25/2015 1125   CREATININE 0.57 09/20/2014 1511   CREATININE 0.60 05/10/2014 2005      Component Value Date/Time   CALCIUM 9.7 05/25/2015 1125   CALCIUM 9.1 09/20/2014 1511   ALKPHOS 70 05/25/2015 1125   ALKPHOS 51  01/26/2015 1436   AST 21 05/25/2015 1125   AST 16 01/26/2015 1436   ALT 17 05/25/2015 1125   ALT 11 01/26/2015 1436   BILITOT 0.69 05/25/2015 1125   BILITOT 0.5 01/26/2015 1436       Lab Results  Component Value Date   WBC 4.3 05/25/2015   HGB 14.2 05/25/2015   HCT 43.7 05/25/2015   MCV 90.1 05/25/2015   PLT 276 05/25/2015   NEUTROABS 3.1 05/25/2015     ASSESSMENT & PLAN:  Bilateral breast cancer 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%, PR 100%, HER-2 negative Pathological stage: T1 cN0 M0 ER 100%, PR 100%, HER-2 negative ratio 0.9, margins negative, 2 SLN negative  Left breast: Clinical T1 BN 0 M0 invasive ductal carcinoma ER positive PR 45%, HER-2 negative Pathologic stage: T1b N0 M0 ER 100% PR 45% HER-2 negative ratio 1.1 margins clear, 3 sentinel nodes negative Oncotype DX recurrence score 16, 10% risk of recurrence  Current treatment: Tamoxifen 20 mg daily started January 2016 plan is to continue this until she is postmenopausal and then switch her to aromatase inhibitor therapy 5-10 years Estradiol 203 and FSH 62.6 on 05/25/2015  Tamoxifen toxicities: 1. Fatigue 2. Mild hot flashes 3. Muscle aches  Patient is very worried about the fatigue and how she feels after she takes tamoxifen. She is not menopausal based upon the facet and estradiol levels so there is no other alternative to tamoxifen therapy. I discussed with her about taking half a tablet tamoxifen in the morning and half a tablet in the evening. If she cannot tolerate that then she will could take half a tablet in the evening alone.  Return to clinic in 6 months and we will check [facility] and estradiol prior to her visit.   No orders of the defined types were placed in this encounter.   The patient has a good understanding of the overall plan. she agrees with it. she will call with any problems that may develop before the next visit here.   Rulon Eisenmenger, MD 06/01/2015

## 2015-06-01 NOTE — Telephone Encounter (Signed)
Appointments made and avs printed for patient °

## 2015-06-01 NOTE — Progress Notes (Signed)
Unable to perform assessment - unable to get in to room before MD visit.

## 2015-06-11 ENCOUNTER — Telehealth: Payer: Self-pay | Admitting: *Deleted

## 2015-06-11 NOTE — Telephone Encounter (Signed)
Pt called c/o irregular bleeding off and on for last 3 weeks, had surgery back in June D&C told to follow up if bleeding should occur. I called and left message on pt voicemail to schedule OV.

## 2015-06-12 ENCOUNTER — Ambulatory Visit (INDEPENDENT_AMBULATORY_CARE_PROVIDER_SITE_OTHER): Payer: BLUE CROSS/BLUE SHIELD | Admitting: Gynecology

## 2015-06-12 ENCOUNTER — Encounter: Payer: Self-pay | Admitting: Gynecology

## 2015-06-12 VITALS — BP 118/74

## 2015-06-12 DIAGNOSIS — N926 Irregular menstruation, unspecified: Secondary | ICD-10-CM | POA: Diagnosis not present

## 2015-06-12 NOTE — Progress Notes (Signed)
Brandy [name] 01-04-1961 [id number]        55 y.o.  [id number] Presents with persistent bleeding. 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 Had several spontaneous menses after that. Has not had a menses for several months and then started bleeding 2-3 weeks ago has bled on and off since then. Notes it seems to be low but better now.  Had recent estradiol level this month of 203 and an Walker of 62. Currently on tamoxifen for her breast cancer.  Past medical history,surgical history, problem list, medications, allergies, family history and social history were all reviewed and documented in the EPIC chart.  Directed ROS with pertinent positives and negatives documented in the history of present illness/assessment and plan.  Exam: [name] assistant Filed Vitals:   06/12/15 1241  BP: 118/74   General appearance:  Normal Abdomen soft nontender without masses guarding rebound Pelvic external BUS vagina with white menses flow. Cervix normal. Uterus normal size midline mobile nontender. Adnexa without masses or tenderness.  Assessment/Plan:  55 y.o. [id number] with perimenopausal irregular bleeding. Estradiol still in a good level with elevated FSH indicating that she is beginning her menopause. Issues of continued irregular bleeding on and off discussed. Hormone receptor positive breast cancer issues as far as hormonal manipulation for her irregular bleeding discussed. Possible hysterectomy to facilitate management as far as irregular bleeding also to remove her ovaries if we choose this route from a breast cancer treatment standpoint. Will monitor for now as her bleeding appears to be getting better. If continues over the next couple days she says she'll call and would accept a short course of Provera withdrawal. Again the issue of using this in a receptor positive breast cancer history discussed. If after discussion with her husband she was to proceed with hysterectomy  for definitive treatment then she will call me for this also.    [name] MD, 1:32 PM 06/12/2015

## 2015-06-12 NOTE — Patient Instructions (Signed)
Call if your bleeding continues.  Call if you want to proceed with hysterectomy as discussed.

## 2015-08-13 ENCOUNTER — Encounter: Payer: Self-pay | Admitting: Gynecology

## 2015-08-13 ENCOUNTER — Ambulatory Visit (INDEPENDENT_AMBULATORY_CARE_PROVIDER_SITE_OTHER): Payer: BLUE CROSS/BLUE SHIELD | Admitting: Gynecology

## 2015-08-13 VITALS — BP 120/76 | Ht 64.0 in | Wt 109.0 lb

## 2015-08-13 DIAGNOSIS — Z01419 Encounter for gynecological examination (general) (routine) without abnormal findings: Secondary | ICD-10-CM | POA: Diagnosis not present

## 2015-08-13 DIAGNOSIS — Z1321 Encounter for screening for nutritional disorder: Secondary | ICD-10-CM | POA: Diagnosis not present

## 2015-08-13 DIAGNOSIS — Z13220 Encounter for screening for lipoid disorders: Secondary | ICD-10-CM

## 2015-08-13 DIAGNOSIS — Z1329 Encounter for screening for other suspected endocrine disorder: Secondary | ICD-10-CM | POA: Diagnosis not present

## 2015-08-13 LAB — CBC WITH DIFFERENTIAL/PLATELET
Basophils Absolute: 0 10*3/uL (ref 0.0–0.1)
Basophils Relative: 1 % (ref 0–1)
Eosinophils Absolute: 0 10*3/uL (ref 0.0–0.7)
Eosinophils Relative: 1 % (ref 0–5)
HCT: 40.3 % (ref 36.0–46.0)
Hemoglobin: 13.5 g/dL (ref 12.0–15.0)
Lymphocytes Relative: 29 % (ref 12–46)
Lymphs Abs: 1 10*3/uL (ref 0.7–4.0)
MCH: 29.6 pg (ref 26.0–34.0)
MCHC: 33.5 g/dL (ref 30.0–36.0)
MCV: 88.4 fL (ref 78.0–100.0)
MPV: 10.2 fL (ref 8.6–12.4)
Monocytes Absolute: 0.2 10*3/uL (ref 0.1–1.0)
Monocytes Relative: 7 % (ref 3–12)
Neutro Abs: 2.2 10*3/uL (ref 1.7–7.7)
Neutrophils Relative %: 62 % (ref 43–77)
Platelets: 258 10*3/uL (ref 150–400)
RBC: 4.56 MIL/uL (ref 3.87–5.11)
RDW: 13.7 % (ref 11.5–15.5)
WBC: 3.5 10*3/uL — ABNORMAL LOW (ref 4.0–10.5)

## 2015-08-13 LAB — LIPID PANEL
Cholesterol: 176 mg/dL (ref 125–200)
HDL: 80 mg/dL (ref >=46)
LDL Cholesterol: 85 mg/dL (ref <130)
Total CHOL/HDL Ratio: 2.2 Ratio (ref <=5.0)
Triglycerides: 55 mg/dL (ref <150)
VLDL: 11 mg/dL (ref <30)

## 2015-08-13 LAB — COMPREHENSIVE METABOLIC PANEL
ALT: 23 U/L (ref 6–29)
AST: 22 U/L (ref 10–35)
Albumin: 4.6 g/dL (ref 3.6–5.1)
Alkaline Phosphatase: 51 U/L (ref 33–130)
BUN: 11 mg/dL (ref 7–25)
CO2: 25 mmol/L (ref 20–31)
Calcium: 9.4 mg/dL (ref 8.6–10.4)
Chloride: 102 mmol/L (ref 98–110)
Creat: 0.76 mg/dL (ref 0.50–1.05)
Glucose, Bld: 76 mg/dL (ref 65–99)
Potassium: 3.8 mmol/L (ref 3.5–5.3)
Sodium: 141 mmol/L (ref 135–146)
Total Bilirubin: 0.6 mg/dL (ref 0.2–1.2)
Total Protein: 6.7 g/dL (ref 6.1–8.1)

## 2015-08-13 LAB — TSH: TSH: 1.78 mIU/L

## 2015-08-13 NOTE — Patient Instructions (Signed)

## 2015-08-13 NOTE — Progress Notes (Signed)
    Brandy [name] 07-Oct-1960 [id number]        55 y.o.  [id number]  for annual exam.  Several issues noted below.  Past medical history,surgical history, problem list, medications, allergies, family history and social history were all reviewed and documented as reviewed in the EPIC chart.  ROS:  Performed with pertinent positives and negatives included in the history, assessment and plan.   Additional significant findings :  none   Exam: [name] assistant Filed Vitals:   08/13/15 1101  BP: 120/76  Height: 5\' 4"  (1.626 m)  Weight: 109 lb (49.442 kg)   General appearance:  Normal affect, orientation and appearance. Skin: Grossly normal HEENT: Without gross lesions.  No cervical or supraclavicular adenopathy. Thyroid normal.  Lungs:  Clear without wheezing, rales or rhonchi Cardiac: RR, without RMG Abdominal:  Soft, nontender, without masses, guarding, rebound, organomegaly or hernia Breasts:  Examined lying and sitting. Status post bilateral mastectomies with reconstruction implants. No masses, skin changes or axillary adenopathy.  Pelvic:  Ext/BUS/vagina with atrophic changes  Cervix with atrophic changes  Uterus anteverted, normal size, shape and contour, midline and mobile nontender   Adnexa without masses or tenderness    Anus and perineum normal   Rectovaginal normal sphincter tone without palpated masses or tenderness.    Assessment/Plan:  55 y.o. [id number] female for annual exam.   1. Perimenopausal. Recent evaluation showed elevated FSH with a normal estradiol level. Was having some irregular bleeding and I saw her in January. This is resolved and she's done no further bleeding. Status post hysteroscopy D&C June 2016. Will keep menstrual calendar for now and if any atypical or prolonged bleeding patient is to call. At this point we will monitor as long as no further bleeding then we'll follow her expectantly. She is otherwise doing well without significant hot flashes or  night sweats. 2. Bilateral breast cancer status post mastectomies. Exam NED. Currently on tamoxifen. We'll continue to follow up with oncology. 3. Pap smear/HPV 06/2012 negative. No Pap smear done today. No history of significant abnormal Pap smears previously. 4. Colonoscopy 2015. Repeat at their recommended interval. 5. DEXA never. Will plan further into the menopause. Increased calcium vitamin D reviewed. Check vitamin D level today. 6. Health maintenance. Patient requests routine labs. CBC, CMP, lipid profile, TSH, vitamin D and urinalysis ordered. Follow up 1 year, sooner as needed.   [name] MD, 11:38 AM 08/13/2015

## 2015-08-14 ENCOUNTER — Other Ambulatory Visit: Payer: Self-pay | Admitting: Gynecology

## 2015-08-14 DIAGNOSIS — E559 Vitamin D deficiency, unspecified: Secondary | ICD-10-CM

## 2015-08-14 LAB — URINALYSIS W MICROSCOPIC + REFLEX CULTURE
Bacteria, UA: NONE SEEN [HPF]
Bilirubin Urine: NEGATIVE
Casts: NONE SEEN [LPF]
Crystals: NONE SEEN [HPF]
Glucose, UA: NEGATIVE
Hgb urine dipstick: NEGATIVE
Ketones, ur: NEGATIVE
Nitrite: NEGATIVE
Protein, ur: NEGATIVE
RBC / HPF: NONE SEEN RBC/HPF (ref <=2)
Specific Gravity, Urine: 1.005 (ref 1.001–1.035)
Squamous Epithelial / LPF: NONE SEEN [HPF] (ref <=5)
WBC, UA: NONE SEEN WBC/HPF (ref <=5)
Yeast: NONE SEEN [HPF]
pH: 7 (ref 5.0–8.0)

## 2015-08-14 LAB — VITAMIN D 25 HYDROXY (VIT D DEFICIENCY, FRACTURES): Vit D, 25-Hydroxy: 23 ng/mL — ABNORMAL LOW (ref 30–100)

## 2015-08-15 LAB — URINE CULTURE: Colony Count: 10000

## 2015-10-16 ENCOUNTER — Other Ambulatory Visit: Payer: Self-pay

## 2015-11-16 ENCOUNTER — Other Ambulatory Visit (HOSPITAL_BASED_OUTPATIENT_CLINIC_OR_DEPARTMENT_OTHER): Payer: BLUE CROSS/BLUE SHIELD

## 2015-11-16 DIAGNOSIS — C50412 Malignant neoplasm of upper-outer quadrant of left female breast: Principal | ICD-10-CM

## 2015-11-16 DIAGNOSIS — Z853 Personal history of malignant neoplasm of breast: Secondary | ICD-10-CM

## 2015-11-16 DIAGNOSIS — C50411 Malignant neoplasm of upper-outer quadrant of right female breast: Secondary | ICD-10-CM

## 2015-11-16 LAB — CBC WITH DIFFERENTIAL/PLATELET
BASO%: 1 % (ref 0.0–2.0)
Basophils Absolute: 0 10*3/uL (ref 0.0–0.1)
EOS%: 1.2 % (ref 0.0–7.0)
Eosinophils Absolute: 0 10*3/uL (ref 0.0–0.5)
HCT: 42.1 % (ref 34.8–46.6)
HGB: 13.9 g/dL (ref 11.6–15.9)
LYMPH%: 28.9 % (ref 14.0–49.7)
MCH: 29.6 pg (ref 25.1–34.0)
MCHC: 32.9 g/dL (ref 31.5–36.0)
MCV: 89.7 fL (ref 79.5–101.0)
MONO#: 0.2 10*3/uL (ref 0.1–0.9)
MONO%: 5.8 % (ref 0.0–14.0)
NEUT#: 2.2 10*3/uL (ref 1.5–6.5)
NEUT%: 63.1 % (ref 38.4–76.8)
Platelets: 209 10*3/uL (ref 145–400)
RBC: 4.7 10*6/uL (ref 3.70–5.45)
RDW: 13 % (ref 11.2–14.5)
WBC: 3.5 10*3/uL — ABNORMAL LOW (ref 3.9–10.3)
lymph#: 1 10*3/uL (ref 0.9–3.3)

## 2015-11-16 LAB — COMPREHENSIVE METABOLIC PANEL
ALT: 34 U/L (ref 0–55)
AST: 25 U/L (ref 5–34)
Albumin: 4.1 g/dL (ref 3.5–5.0)
Alkaline Phosphatase: 68 U/L (ref 40–150)
Anion Gap: 9 mEq/L (ref 3–11)
BUN: 15.2 mg/dL (ref 7.0–26.0)
CO2: 26 mEq/L (ref 22–29)
Calcium: 9.8 mg/dL (ref 8.4–10.4)
Chloride: 106 mEq/L (ref 98–109)
Creatinine: 0.8 mg/dL (ref 0.6–1.1)
EGFR: 86 mL/min/{1.73_m2} — ABNORMAL LOW (ref >90)
Glucose: 89 mg/dl (ref 70–140)
Potassium: 3.8 mEq/L (ref 3.5–5.1)
Sodium: 141 mEq/L (ref 136–145)
Total Bilirubin: 0.6 mg/dL (ref 0.20–1.20)
Total Protein: 7.3 g/dL (ref 6.4–8.3)

## 2015-11-17 LAB — FOLLICLE STIMULATING HORMONE: FSH: 115.8 m[IU]/mL

## 2015-11-23 ENCOUNTER — Ambulatory Visit (HOSPITAL_BASED_OUTPATIENT_CLINIC_OR_DEPARTMENT_OTHER): Payer: BLUE CROSS/BLUE SHIELD | Admitting: Hematology and Oncology

## 2015-11-23 ENCOUNTER — Encounter: Payer: Self-pay | Admitting: Hematology and Oncology

## 2015-11-23 ENCOUNTER — Telehealth: Payer: Self-pay | Admitting: Hematology and Oncology

## 2015-11-23 VITALS — BP 136/85 | HR 69 | Temp 98.3°F | Resp 18 | Ht 64.0 in | Wt 112.6 lb

## 2015-11-23 DIAGNOSIS — Z79810 Long term (current) use of selective estrogen receptor modulators (SERMs): Secondary | ICD-10-CM | POA: Diagnosis not present

## 2015-11-23 DIAGNOSIS — C50411 Malignant neoplasm of upper-outer quadrant of right female breast: Secondary | ICD-10-CM

## 2015-11-23 DIAGNOSIS — C50412 Malignant neoplasm of upper-outer quadrant of left female breast: Secondary | ICD-10-CM

## 2015-11-23 MED ORDER — ANASTROZOLE 1 MG PO TABS: 1.0000 mg | ORAL_TABLET | Freq: Every day | ORAL | Status: DC

## 2015-11-23 NOTE — Assessment & Plan Note (Signed)
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%, PR 100%, HER-2 negative Pathological stage: T1 cN0 M0 ER 100%, PR 100%, HER-2 negative ratio 0.9, margins negative, 2 SLN negative  Left breast: Clinical T1 BN 0 M0 invasive ductal carcinoma ER positive PR 45%, HER-2 negative Pathologic stage: T1b N0 M0 ER 100% PR 45% HER-2 negative ratio 1.1 margins clear, 3 sentinel nodes negative Oncotype DX recurrence score 16, 10% risk of recurrence  Current treatment: Tamoxifen 20 mg daily started January 2016 plan is to continue this until she is postmenopausal and then switch her to aromatase inhibitor therapy 5-10 years Estradiol 203 and FSH 62.6 on 05/25/2015  Tamoxifen toxicities: 1. Fatigue 2. Mild hot flashes 3. Muscle aches Patient is very worried about the fatigue and how she feels after she takes tamoxifen. She is not menopausal based upon the facet and estradiol levels so there is no other alternative to tamoxifen therapy. I discussed with her about taking half a tablet tamoxifen in the morning and half a tablet in the evening. If she cannot tolerate that then she will could take half a tablet in the evening alone.  Return to clinic in 6 months and we will check [facility] and estradiol prior to her visit.

## 2015-11-23 NOTE — Telephone Encounter (Signed)
appt made and avs printed. Mri and pet to be scheduled by central radiology

## 2015-11-23 NOTE — Progress Notes (Signed)
Patient Care Team: Reynold Bowen, MD as PCP - General (Endocrinology) [name], MD as Consulting Physician (Gynecology) Autumn Messing III, MD as Consulting Physician (General Surgery) Nicholas Lose, MD as Consulting Physician (Hematology and Oncology)  DIAGNOSIS: Bilateral breast cancer [facility])   Staging form: Breast, AJCC 7th Edition     Clinical: Stage IA (T1c, N0, cM0) - Unsigned       Staging comments: Staged at breast conference on 01/18/14.      Pathologic: No stage assigned - Unsigned   SUMMARY OF ONCOLOGIC HISTORY:   Bilateral breast cancer ([place])   01/02/2014 Mammogram Right breast abnormalities that were evaluated by ultrasound showing hypoechoic mass right breast 10:00 subareolar, at 9:00 position intramammary lymph node 0.85 cm   01/10/2014 Initial Diagnosis Breast cancer of upper-outer quadrant of right female breast: Invasive ductal carcinoma with DCIS ER 100%, PR 100%, Ki-67 10%, HER-2 negative ratio 0.9   01/16/2014 Breast MRI Right breast  1.4 x 1.4 x 1.5 cm inferomedially linear non-mass enhancement extending to the nipple 2 cm (biopsied on 01/10/14 IDC ER/PR 100% Ki-67 10%) : Left breast 6 mm oval Mass, no lymph nodes.   01/20/2014 Procedure Left breast 6:00 position biopsy: IDC with DCIS ER 100%, PR 45%, Ki-67 5%, HER-2 negative ratio 1.13; clinical T1 B. N0 M0 stage IA   02/06/2014 Procedure BRCA1 and 2 are negative, Breast Next panel Testing was normal and did not reveal any clearly harmful mutation in these genes. The genes tested were ATM, BARD1, BRCA1, BRCA2, BRIP1, CDH1, CHEK2, MRE11A, MUTYH, NBN, NF1, PALB2, PTEN, RAD50, RAD51C, RAD51D,   04/26/2014 Surgery Bilateral mastectomies with reconstruction, right breast 1.2 cm, ER 100%, PR 100%, HER-2 negative ratio 0.9, 2 sentinel nodes negative T1 cN0 M0 stage IA, left breast 0.8 cm ER/PR positive HER-2 -3 sentinel nodes negative T1b N0 M0 stage IA   05/26/2014 -  Anti-estrogen oral therapy Tamoxifen 20 mg daily switched to  anastrozole 1 mg daily 11/23/2015 when she became postmenopausal    CHIEF COMPLIANT: Follow-up on antiestrogen therapy  INTERVAL HISTORY: Brandy [name] is a 55 year old with above-mentioned history of right breast cancer who underwent bilateral mastectomies and has been on tamoxifen therapy. She was complaining of lots of hot flashes and myalgias from tamoxifen. She was found to be postmenopausal by elevated FSH result and she is here to switch her treatment from tamoxifen to anastrozole. She is also feeling small nodularity on the superior aspect of the left breast related to most likely fat necrosis. She is also complaining of Severe recurrent headaches as well as severe upper back pain. No clear aggravating or relieving factors. She is very concerned about metastatic breast cancer.  REVIEW OF SYSTEMS:   Constitutional: Denies fevers, chills or abnormal weight loss Eyes: Denies blurriness of vision Ears, nose, mouth, throat, and face: Denies mucositis or sore throat Respiratory: Denies cough, dyspnea or wheezes Cardiovascular: Denies palpitation, chest discomfort Gastrointestinal:  Denies nausea, heartburn or change in bowel habits Skin: Denies abnormal skin rashes Lymphatics: Denies new lymphadenopathy or easy bruising Neurological:Denies numbness, tingling or new weaknesses Behavioral/Psych: Mood is stable, no new changes  Extremities: No lower extremity edema Breast: Bilateral breast reconstructions with small tiny nodularity in the superior aspect of the left breast reconstruction. All other systems were reviewed with the patient and are negative.  I have reviewed the past medical history, past surgical history, social history and family history with the patient and they are unchanged from previous note.  ALLERGIES:  is allergic to zofran.  MEDICATIONS:  Current Outpatient Prescriptions  Medication Sig Dispense Refill  . albuterol (PROVENTIL HFA;VENTOLIN HFA) 108 (90 BASE) MCG/ACT  inhaler Inhale 2 puffs into the lungs every 6 (six) hours as needed for wheezing or shortness of breath (asthma).    . ALPRAZolam (XANAX) 0.5 MG tablet Take 0.5 mg by mouth 4 (four) times daily as needed for anxiety or sleep. Take 1/2 to 1 tablet daily as needed for acute anxiety/insomnia    . anastrozole (ARIMIDEX) 1 MG tablet Take 1 tablet (1 mg total) by mouth daily. 90 tablet 3   No current facility-administered medications for this visit.    PHYSICAL EXAMINATION: ECOG PERFORMANCE STATUS: 1 - Symptomatic but completely ambulatory  Filed Vitals:   11/23/15 1124  BP: 136/85  Pulse: 69  Temp: 98.3 F (36.8 C)  Resp: 18   Filed Weights   11/23/15 1124  Weight: 112 lb 9.6 oz (51.075 kg)    GENERAL:alert, no distress and comfortable SKIN: skin color, texture, turgor are normal, no rashes or significant lesions EYES: normal, Conjunctiva are pink and non-injected, sclera clear OROPHARYNX:no exudate, no erythema and lips, buccal mucosa, and tongue normal  NECK: supple, thyroid normal size, non-tender, without nodularity LYMPH:  no palpable lymphadenopathy in the cervical, axillary or inguinal LUNGS: clear to auscultation and percussion with normal breathing effort HEART: regular rate & rhythm and no murmurs and no lower extremity edema ABDOMEN:abdomen soft, non-tender and normal bowel sounds MUSCULOSKELETAL:no cyanosis of digits and no clubbing  NEURO: alert & oriented x 3 with fluent speech, no focal motor/sensory deficits EXTREMITIES: No lower extremity edema BREAST: Bilateral breast reconstructions, small nodularity in the superior aspect of left breast is suggestive of fat necrosis. (exam performed in the presence of a chaperone)  LABORATORY DATA:  I have reviewed the data as listed   Chemistry      Component Value Date/Time   NA 141 11/16/2015 1114   NA 141 08/13/2015 1134   K 3.8 11/16/2015 1114   K 3.8 08/13/2015 1134   CL 102 08/13/2015 1134   CO2 26 11/16/2015 1114     CO2 25 08/13/2015 1134   BUN 15.2 11/16/2015 1114   BUN 11 08/13/2015 1134   CREATININE 0.8 11/16/2015 1114   CREATININE 0.76 08/13/2015 1134   CREATININE 0.60 05/10/2014 2005      Component Value Date/Time   CALCIUM 9.8 11/16/2015 1114   CALCIUM 9.4 08/13/2015 1134   ALKPHOS 68 11/16/2015 1114   ALKPHOS 51 08/13/2015 1134   AST 25 11/16/2015 1114   AST 22 08/13/2015 1134   ALT 34 11/16/2015 1114   ALT 23 08/13/2015 1134   BILITOT 0.60 11/16/2015 1114   BILITOT 0.6 08/13/2015 1134       Lab Results  Component Value Date   WBC 3.5* 11/16/2015   HGB 13.9 11/16/2015   HCT 42.1 11/16/2015   MCV 89.7 11/16/2015   PLT 209 11/16/2015   NEUTROABS 2.2 11/16/2015     ASSESSMENT & PLAN:  Bilateral breast cancer 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%, PR 100%, HER-2 negative Pathological stage: T1 cN0 M0 ER 100%, PR 100%, HER-2 negative ratio 0.9, margins negative, 2 SLN negative  Left breast: Clinical T1 BN 0 M0 invasive ductal carcinoma ER positive PR 45%, HER-2 negative Pathologic stage: T1b N0 M0 ER 100% PR 45% HER-2 negative ratio 1.1 margins clear, 3 sentinel nodes negative Oncotype DX recurrence score 16, 10% risk of recurrence  Current treatment: Tamoxifen 20 mg daily started January 2016 plan is to continue this until she is postmenopausal and then switch her to aromatase inhibitor therapy 5-10 years Estradiol 203 and FSH 62.6 on 05/25/2015  Tamoxifen toxicities: 1. Fatigue 2. Mild hot flashes 3. Muscle aches Based upon recent blood work showing that her [facility] is greater than 100, I recommended switching her from tamoxifen to anastrozole. We discussed risks and benefits of anastrozole including the hot flashes myalgias and risk of osteoporosis.  Bone pain in the upper back as well as recurrent headaches: I will order a PET CT scan and an MRI of the brain for further assessment. I will call her with the results of the scans.  Return to  clinic in 6 months     Orders Placed This Encounter  Procedures  . NM PET Image Restag (PS) Skull Base To Thigh    Standing Status: Future     Number of Occurrences:      Standing Expiration Date: 11/22/2016    Order Specific Question:  Reason for Exam (SYMPTOM  OR DIAGNOSIS REQUIRED)    Answer:  Bone pain and abdominal pain with breast cancer    Order Specific Question:  Is the patient pregnant?    Answer:  No    Order Specific Question:  Preferred imaging location?    Answer:  [facility]    Order Specific Question:  If indicated for the ordered procedure, I authorize the administration of a radiopharmaceutical per Radiology protocol    Answer:  Yes  . MR Brain W Contrast    Standing Status: Future     Number of Occurrences:      Standing Expiration Date: 01/22/2017    Order Specific Question:  Reason for Exam (SYMPTOM  OR DIAGNOSIS REQUIRED)    Answer:  Recurrent headaches with breast cancer    Order Specific Question:  Is the patient pregnant?    Answer:  No    Order Specific Question:  Preferred imaging location?    Answer:  [facility] (table limit-550lbs)    Order Specific Question:  Does the patient have a pacemaker or implanted devices?    Answer:  No    Order Specific Question:  What is the patient's sedation requirement?    Answer:  No Sedation   The patient has a good understanding of the overall plan. she agrees with it. she will call with any problems that may develop before the next visit here.   Rulon Eisenmenger, MD 11/23/2015

## 2015-11-26 ENCOUNTER — Other Ambulatory Visit: Payer: Self-pay

## 2015-11-26 DIAGNOSIS — C50412 Malignant neoplasm of upper-outer quadrant of left female breast: Principal | ICD-10-CM

## 2015-11-26 DIAGNOSIS — C50411 Malignant neoplasm of upper-outer quadrant of right female breast: Secondary | ICD-10-CM

## 2015-11-26 NOTE — Progress Notes (Signed)
GI requested order to be with and without contrast.  New order entered and faxed.  Old order d/c.

## 2015-11-29 ENCOUNTER — Other Ambulatory Visit: Payer: Self-pay | Admitting: Hematology and Oncology

## 2015-11-29 DIAGNOSIS — C50411 Malignant neoplasm of upper-outer quadrant of right female breast: Secondary | ICD-10-CM

## 2015-11-29 DIAGNOSIS — C50412 Malignant neoplasm of upper-outer quadrant of left female breast: Principal | ICD-10-CM

## 2015-12-06 ENCOUNTER — Ambulatory Visit (HOSPITAL_COMMUNITY): Payer: BLUE CROSS/BLUE SHIELD

## 2015-12-11 ENCOUNTER — Encounter (HOSPITAL_COMMUNITY): Payer: BLUE CROSS/BLUE SHIELD

## 2015-12-12 ENCOUNTER — Other Ambulatory Visit: Payer: BLUE CROSS/BLUE SHIELD

## 2015-12-26 ENCOUNTER — Inpatient Hospital Stay: Admission: RE | Admit: 2015-12-26 | Payer: BLUE CROSS/BLUE SHIELD | Source: Ambulatory Visit

## 2016-01-03 ENCOUNTER — Ambulatory Visit
Admission: RE | Admit: 2016-01-03 | Discharge: 2016-01-03 | Disposition: A | Discharge status: Home / Self Care | Payer: BLUE CROSS/BLUE SHIELD | Source: Ambulatory Visit | Attending: Hematology and Oncology | Admitting: Hematology and Oncology

## 2016-01-03 DIAGNOSIS — C50412 Malignant neoplasm of upper-outer quadrant of left female breast: Principal | ICD-10-CM

## 2016-01-03 DIAGNOSIS — C50411 Malignant neoplasm of upper-outer quadrant of right female breast: Secondary | ICD-10-CM

## 2016-01-03 MED ORDER — GADOBENATE DIMEGLUMINE 529 MG/ML IV SOLN
10.0000 mL | Freq: Once | INTRAVENOUS | Status: AC | PRN
  Administered 2016-01-03: 10 mL via INTRAVENOUS

## 2016-01-04 ENCOUNTER — Telehealth: Payer: Self-pay

## 2016-01-04 NOTE — Telephone Encounter (Signed)
Called to discuss Brain MRI results with pt as requested by Dr. Lindi Adie.  Unable to reach pt but left VM requesting call back.

## 2016-02-06 ENCOUNTER — Other Ambulatory Visit: Payer: Self-pay | Admitting: General Surgery

## 2016-02-06 DIAGNOSIS — C50512 Malignant neoplasm of lower-outer quadrant of left female breast: Secondary | ICD-10-CM

## 2016-02-22 ENCOUNTER — Ambulatory Visit: Payer: BLUE CROSS/BLUE SHIELD | Admitting: Hematology and Oncology

## 2016-02-22 NOTE — Progress Notes (Deleted)
Patient Care Team: Reynold Bowen, MD as PCP - General (Endocrinology) [name], MD as Consulting Physician (Gynecology) Autumn Messing III, MD as Consulting Physician (General Surgery) Nicholas Lose, MD as Consulting Physician (Hematology and Oncology)  DIAGNOSIS: Bilateral breast cancer [facility])   Staging form: Breast, AJCC 7th Edition   - Clinical: Stage IA (T1c, N0, cM0) - Unsigned         Staging comments: Staged at breast conference on 01/18/14.    - Pathologic: No stage assigned - Unsigned  SUMMARY OF ONCOLOGIC HISTORY:   Bilateral breast cancer ([place])   01/02/2014 Mammogram    Right breast abnormalities that were evaluated by ultrasound showing hypoechoic mass right breast 10:00 subareolar, at 9:00 position intramammary lymph node 0.85 cm      01/10/2014 Initial Diagnosis    Breast cancer of upper-outer quadrant of right female breast: Invasive ductal carcinoma with DCIS ER 100%, PR 100%, Ki-67 10%, HER-2 negative ratio 0.9      01/16/2014 Breast MRI    Right breast  1.4 x 1.4 x 1.5 cm inferomedially linear non-mass enhancement extending to the nipple 2 cm (biopsied on 01/10/14 IDC ER/PR 100% Ki-67 10%) : Left breast 6 mm oval Mass, no lymph nodes.      01/20/2014 Procedure    Left breast 6:00 position biopsy: IDC with DCIS ER 100%, PR 45%, Ki-67 5%, HER-2 negative ratio 1.13; clinical T1 B. N0 M0 stage IA      02/06/2014 Procedure    BRCA1 and 2 are negative, Breast Next panel Testing was normal and did not reveal any clearly harmful mutation in these genes. The genes tested were ATM, BARD1, BRCA1, BRCA2, BRIP1, CDH1, CHEK2, MRE11A, MUTYH, NBN, NF1, PALB2, PTEN, RAD50, RAD51C, RAD51D,      04/26/2014 Surgery    Bilateral mastectomies with reconstruction, right breast 1.2 cm, ER 100%, PR 100%, HER-2 negative ratio 0.9, 2 sentinel nodes negative T1 cN0 M0 stage IA, left breast 0.8 cm ER/PR positive HER-2 -3 sentinel nodes negative T1b N0 M0 stage IA      05/26/2014 -   Anti-estrogen oral therapy    Tamoxifen 20 mg daily switched to anastrozole 1 mg daily 11/23/2015 when she became postmenopausal       CHIEF COMPLIANT: Follow-up on anastrozole  INTERVAL HISTORY: Brandy [name] is a 55 year old with above-mentioned history of bilateral cystectomies was currently on adjuvant antiestrogen therapy. She was on tamoxifen and is switched her to anastrozole. She is here for toxicity evaluation. She appears to be tolerating anastrozole reasonably well. She denies any lumps or nodules in chest wall or axilla. Previously she had headaches and and brain MRI was negative. She was also having bone pain issues but she did not undergo the imaging studies that had been ordered.  REVIEW OF SYSTEMS:   Constitutional: Denies fevers, chills or abnormal weight loss Eyes: Denies blurriness of vision Ears, nose, mouth, throat, and face: Denies mucositis or sore throat Respiratory: Denies cough, dyspnea or wheezes Cardiovascular: Denies palpitation, chest discomfort Gastrointestinal:  Denies nausea, heartburn or change in bowel habits Skin: Denies abnormal skin rashes Lymphatics: Denies new lymphadenopathy or easy bruising Neurological:Denies numbness, tingling or new weaknesses Behavioral/Psych: Mood is stable, no new changes  Extremities: No lower extremity edema Breast:  denies any pain or lumps or nodules in either breasts All other systems were reviewed with the patient and are negative.  I have reviewed the past medical history, past surgical history, social history and family history with the  patient and they are unchanged from previous note.  ALLERGIES:  is allergic to zofran [ondansetron hcl].  MEDICATIONS:  Current Outpatient Prescriptions  Medication Sig Dispense Refill  . albuterol (PROVENTIL HFA;VENTOLIN HFA) 108 (90 BASE) MCG/ACT inhaler Inhale 2 puffs into the lungs every 6 (six) hours as needed for wheezing or shortness of breath (asthma).    . ALPRAZolam  (XANAX) 0.5 MG tablet Take 0.5 mg by mouth 4 (four) times daily as needed for anxiety or sleep. Take 1/2 to 1 tablet daily as needed for acute anxiety/insomnia    . anastrozole (ARIMIDEX) 1 MG tablet Take 1 tablet (1 mg total) by mouth daily. 90 tablet 3   No current facility-administered medications for this visit.     PHYSICAL EXAMINATION: ECOG PERFORMANCE STATUS: 1 - Symptomatic but completely ambulatory  There were no vitals filed for this visit. There were no vitals filed for this visit.  GENERAL:alert, no distress and comfortable SKIN: skin color, texture, turgor are normal, no rashes or significant lesions EYES: normal, Conjunctiva are pink and non-injected, sclera clear OROPHARYNX:no exudate, no erythema and lips, buccal mucosa, and tongue normal  NECK: supple, thyroid normal size, non-tender, without nodularity LYMPH:  no palpable lymphadenopathy in the cervical, axillary or inguinal LUNGS: clear to auscultation and percussion with normal breathing effort HEART: regular rate & rhythm and no murmurs and no lower extremity edema ABDOMEN:abdomen soft, non-tender and normal bowel sounds MUSCULOSKELETAL:no cyanosis of digits and no clubbing  NEURO: alert & oriented x 3 with fluent speech, no focal motor/sensory deficits EXTREMITIES: No lower extremity edema BREAST: No palpable masses or nodules in either right or left breasts. No palpable axillary supraclavicular or infraclavicular adenopathy no breast tenderness or nipple discharge. (exam performed in the presence of a chaperone)  LABORATORY DATA:  I have reviewed the data as listed   Chemistry      Component Value Date/Time   NA 141 11/16/2015 1114   K 3.8 11/16/2015 1114   CL 102 08/13/2015 1134   CO2 26 11/16/2015 1114   BUN 15.2 11/16/2015 1114   CREATININE 0.8 11/16/2015 1114      Component Value Date/Time   CALCIUM 9.8 11/16/2015 1114   ALKPHOS 68 11/16/2015 1114   AST 25 11/16/2015 1114   ALT 34 11/16/2015 1114     BILITOT 0.60 11/16/2015 1114       Lab Results  Component Value Date   WBC 3.5 (L) 11/16/2015   HGB 13.9 11/16/2015   HCT 42.1 11/16/2015   MCV 89.7 11/16/2015   PLT 209 11/16/2015   NEUTROABS 2.2 11/16/2015   ASSESSMENT & PLAN:  Bilateral breast cancer 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%, PR 100%, HER-2 negative Pathological stage: T1 cN0 M0 ER 100%, PR 100%, HER-2 negative ratio 0.9, margins negative, 2 SLN negative  Left breast: Clinical T1 BN 0 M0 invasive ductal carcinoma ER positive PR 45%, HER-2 negative Pathologic stage: T1b N0 M0 ER 100% PR 45% HER-2 negative ratio 1.1 margins clear, 3 sentinel nodes negative Oncotype DX recurrence score 16, 10% risk of recurrence  Current treatment: Tamoxifen 20 mg daily started January 2016 switched to anastrozole 11/23/2015  Anastrozole toxicities: 1  Bone pain in the upper back as well as recurrent headaches: MRI of the brain: Neg. PET/CT scan was not done.  Return to clinic in 6 months   No orders of the defined types were placed in this encounter.  The patient has a good understanding of the  overall plan. she agrees with it. she will call with any problems that may develop before the next visit here.   Rulon Eisenmenger, MD 02/22/16

## 2016-02-22 NOTE — Assessment & Plan Note (Deleted)
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%, PR 100%, HER-2 negative Pathological stage: T1 cN0 M0 ER 100%, PR 100%, HER-2 negative ratio 0.9, margins negative, 2 SLN negative  Left breast: Clinical T1 BN 0 M0 invasive ductal carcinoma ER positive PR 45%, HER-2 negative Pathologic stage: T1b N0 M0 ER 100% PR 45% HER-2 negative ratio 1.1 margins clear, 3 sentinel nodes negative Oncotype DX recurrence score 16, 10% risk of recurrence  Current treatment: Tamoxifen 20 mg daily started January 2016 switched to anastrozole 11/23/2015  Anastrozole toxicities: 1  Bone pain in the upper back as well as recurrent headaches: MRI of the brain: Neg. PET/CT scan was not done.  Return to clinic in 6 months

## 2016-03-14 ENCOUNTER — Telehealth: Payer: Self-pay | Admitting: *Deleted

## 2016-03-14 NOTE — Telephone Encounter (Signed)
I saw this and appears if she is unable to come in today so she will come in Monday.

## 2016-03-14 NOTE — Telephone Encounter (Signed)
Dr.Fontaine I just want to make sure you received this encounter it appeared to be closed.

## 2016-03-14 NOTE — Telephone Encounter (Signed)
Pt called c/o bleeding x 2 weeks now, states she has not bled in months, per office note LMP:08/10/14. States no pain, but changing pad every hour, I explained to patient office best today due to flow, pt declined stating she is not able come today at all,her son has appointment, but can come on Monday. I told pt that I would relay this information to you so you would be aware. Please advise

## 2016-07-29 ENCOUNTER — Telehealth: Payer: Self-pay | Admitting: Hematology and Oncology

## 2016-07-29 NOTE — Telephone Encounter (Signed)
Patient called and needs to schedule an appointment with Dr Lindi Adie

## 2016-08-01 ENCOUNTER — Encounter: Payer: Self-pay | Admitting: Hematology and Oncology

## 2016-08-02 ENCOUNTER — Telehealth: Payer: Self-pay | Admitting: Hematology and Oncology

## 2016-08-02 NOTE — Telephone Encounter (Signed)
Left message re 3/26 f/u

## 2016-08-04 ENCOUNTER — Telehealth: Payer: Self-pay

## 2016-08-04 ENCOUNTER — Telehealth: Payer: Self-pay | Admitting: *Deleted

## 2016-08-04 NOTE — Telephone Encounter (Signed)
March schedule mailed.

## 2016-08-04 NOTE — Telephone Encounter (Signed)
Pt left message in triage voicemail regarding bleeding, I left message for pt to call me back.

## 2016-08-04 NOTE — Telephone Encounter (Signed)
Called pt to confirm her appt 3/26. Pt states and confirmed her appt at 2:15.

## 2016-08-11 ENCOUNTER — Ambulatory Visit (HOSPITAL_BASED_OUTPATIENT_CLINIC_OR_DEPARTMENT_OTHER): Payer: BLUE CROSS/BLUE SHIELD | Admitting: Hematology and Oncology

## 2016-08-11 ENCOUNTER — Encounter: Payer: Self-pay | Admitting: Hematology and Oncology

## 2016-08-11 DIAGNOSIS — C50411 Malignant neoplasm of upper-outer quadrant of right female breast: Secondary | ICD-10-CM

## 2016-08-11 DIAGNOSIS — Z170 Estrogen receptor positive status [ER+]: Secondary | ICD-10-CM

## 2016-08-11 DIAGNOSIS — C50412 Malignant neoplasm of upper-outer quadrant of left female breast: Secondary | ICD-10-CM | POA: Diagnosis not present

## 2016-08-11 MED ORDER — TAMOXIFEN CITRATE 20 MG PO TABS: 20.0000 mg | ORAL_TABLET | Freq: Every day | ORAL | 3 refills | Status: DC

## 2016-08-11 NOTE — Progress Notes (Signed)
Patient Care Team: Reynold Bowen, MD as PCP - General (Endocrinology) [name], MD as Consulting Physician (Gynecology) Autumn Messing III, MD as Consulting Physician (General Surgery) Nicholas Lose, MD as Consulting Physician (Hematology and Oncology)  DIAGNOSIS:  Encounter Diagnosis  Name Primary?  . 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, estrogen receptor positive ([place])     SUMMARY OF ONCOLOGIC HISTORY:   Bilateral breast cancer ([place])   01/02/2014 Mammogram    Right breast abnormalities that were evaluated by ultrasound showing hypoechoic mass right breast 10:00 subareolar, at 9:00 position intramammary lymph node 0.85 cm      01/10/2014 Initial Diagnosis    Breast cancer of upper-outer quadrant of right female breast: Invasive ductal carcinoma with DCIS ER 100%, PR 100%, Ki-67 10%, HER-2 negative ratio 0.9      01/16/2014 Breast MRI    Right breast  1.4 x 1.4 x 1.5 cm inferomedially linear non-mass enhancement extending to the nipple 2 cm (biopsied on 01/10/14 IDC ER/PR 100% Ki-67 10%) : Left breast 6 mm oval Mass, no lymph nodes.      01/20/2014 Procedure    Left breast 6:00 position biopsy: IDC with DCIS ER 100%, PR 45%, Ki-67 5%, HER-2 negative ratio 1.13; clinical T1 B. N0 M0 stage IA      02/06/2014 Procedure    BRCA1 and 2 are negative, Breast Next panel Testing was normal and did not reveal any clearly harmful mutation in these genes. The genes tested were ATM, BARD1, BRCA1, BRCA2, BRIP1, CDH1, CHEK2, MRE11A, MUTYH, NBN, NF1, PALB2, PTEN, RAD50, RAD51C, RAD51D,      04/26/2014 Surgery    Bilateral mastectomies with reconstruction, right breast 1.2 cm, ER 100%, PR 100%, HER-2 negative ratio 0.9, 2 sentinel nodes negative T1 cN0 M0 stage IA, left breast 0.8 cm ER/PR positive HER-2 -3 sentinel nodes negative T1b N0 M0 stage IA      05/26/2014 -  Anti-estrogen oral therapy    Tamoxifen 20 mg daily switched to anastrozole 1 mg daily  11/23/2015 stopped November 2017 and then started back on tamoxifen 08/11/2016       CHIEF COMPLIANT: Follow-up to discuss antiestrogen therapy  INTERVAL HISTORY: Brandy [name] is a 56 year old with above-mentioned history of no problem bilateral mastectomies and reconstruction and was supposed to be on antiestrogen therapy. She stopped taking anastrozole at the end of 2017. She stopped it because of hot flashes. She is here today for routine follow-up. She reports palpable nodularity in the left reconstructed breast and axilla. She also complains of bilateral axillary pain and discomfort.  REVIEW OF SYSTEMS:   Constitutional: Denies fevers, chills or abnormal weight loss Eyes: Denies blurriness of vision Ears, nose, mouth, throat, and face: Denies mucositis or sore throat Respiratory: Denies cough, dyspnea or wheezes Cardiovascular: Denies palpitation, chest discomfort Gastrointestinal:  Denies nausea, heartburn or change in bowel habits Skin: Denies abnormal skin rashes Lymphatics: Denies new lymphadenopathy or easy bruising Neurological:Denies numbness, tingling or new weaknesses Behavioral/Psych: Mood is stable, no new changes  Extremities: No lower extremity edema Breast: Bilateral reconstructed breast with pain in the axilla and palpable lumps in the left axillary fold All other systems were reviewed with the patient and are negative.  I have reviewed the past medical history, past surgical history, social history and family history with the patient and they are unchanged from previous note.  ALLERGIES:  is allergic to zofran [ondansetron hcl].  MEDICATIONS:  Current Outpatient Prescriptions  Medication Sig  Dispense Refill  . albuterol (PROVENTIL HFA;VENTOLIN HFA) 108 (90 BASE) MCG/ACT inhaler Inhale 2 puffs into the lungs every 6 (six) hours as needed for wheezing or shortness of breath (asthma).    . ALPRAZolam (XANAX) 0.5 MG tablet Take 0.5 mg by mouth 4 (four) times daily as  needed for anxiety or sleep. Take 1/2 to 1 tablet daily as needed for acute anxiety/insomnia    . tamoxifen (NOLVADEX) 20 MG tablet Take 1 tablet (20 mg total) by mouth daily. 90 tablet 3   No current facility-administered medications for this visit.     PHYSICAL EXAMINATION: ECOG PERFORMANCE STATUS: 1 - Symptomatic but completely ambulatory  Vitals:   08/11/16 1437  BP: (!) 147/85  Pulse: 85  Resp: 18  Temp: 98 F (36.7 C)   Filed Weights   08/11/16 1437  Weight: 112 lb 12.8 oz (51.2 kg)    GENERAL:alert, no distress and comfortable SKIN: skin color, texture, turgor are normal, no rashes or significant lesions EYES: normal, Conjunctiva are pink and non-injected, sclera clear OROPHARYNX:no exudate, no erythema and lips, buccal mucosa, and tongue normal  NECK: supple, thyroid normal size, non-tender, without nodularity LYMPH:  no palpable lymphadenopathy in the cervical, axillary or inguinal LUNGS: clear to auscultation and percussion with normal breathing effort HEART: regular rate & rhythm and no murmurs and no lower extremity edema ABDOMEN:abdomen soft, non-tender and normal bowel sounds MUSCULOSKELETAL:no cyanosis of digits and no clubbing  NEURO: alert & oriented x 3 with fluent speech, no focal motor/sensory deficits EXTREMITIES: No lower extremity edema  LABORATORY DATA:  I have reviewed the data as listed   Chemistry      Component Value Date/Time   NA 141 11/16/2015 1114   K 3.8 11/16/2015 1114   CL 102 08/13/2015 1134   CO2 26 11/16/2015 1114   BUN 15.2 11/16/2015 1114   CREATININE 0.8 11/16/2015 1114      Component Value Date/Time   CALCIUM 9.8 11/16/2015 1114   ALKPHOS 68 11/16/2015 1114   AST 25 11/16/2015 1114   ALT 34 11/16/2015 1114   BILITOT 0.60 11/16/2015 1114       Lab Results  Component Value Date   WBC 3.5 (L) 11/16/2015   HGB 13.9 11/16/2015   HCT 42.1 11/16/2015   MCV 89.7 11/16/2015   PLT 209 11/16/2015   NEUTROABS 2.2 11/16/2015     ASSESSMENT & PLAN:  Bilateral breast cancer ([facility]) 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%, PR 100%, HER-2 negative Pathological stage: T1 cN0 M0 ER 100%, PR 100%, HER-2 negative ratio 0.9, margins negative, 2 SLN negative  Left breast: Clinical T1 BN 0 M0 invasive ductal carcinoma ER positive PR 45%, HER-2 negative Pathologic stage: T1b N0 M0 ER 100% PR 45% HER-2 negative ratio 1.1 margins clear, 3 sentinel nodes negative Oncotype DX recurrence score 16, 10% risk of recurrence  Current treatment: Tamoxifen 20 mg daily started January 2016  switched to anastrozoleJuly 2017 stopped November 2017 because of hot flashes Recommendation: I recommended that she restart tamoxifen. She will take 10 mg in the morning and 10 mg in the evening Palpable nodularity in the left axilla: Ultrasound left axilla be performed.  Patient complains of multiple signs and symptoms of aches and pains. Previously I had ordered scans but she refused to get them.  Surveillance: Physical examination of the reconstructed breast does not show any palpable concerns Return to clinic in 6 months for follow-up   I spent 25  minutes talking to the patient of which more than half was spent in counseling and coordination of care.  Orders Placed This Encounter  Procedures  . [place] LIMITED JOINT SPACE STRUCTURES UP LEFT    Standing Status:   Future    Standing Expiration Date:   10/11/2017    Order Specific Question:   Reason for Exam (SYMPTOM  OR DIAGNOSIS REQUIRED)    Answer:   Lumps left axilla Breast reconstruction    Order Specific Question:   Preferred imaging location?    Answer:   [facility]   The patient has a good understanding of the overall plan. she agrees with it. she will call with any problems that may develop before the next visit here.   Rulon Eisenmenger, MD 08/11/16

## 2016-08-11 NOTE — Assessment & Plan Note (Signed)
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%, PR 100%, HER-2 negative Pathological stage: T1 cN0 M0 ER 100%, PR 100%, HER-2 negative ratio 0.9, margins negative, 2 SLN negative  Left breast: Clinical T1 BN 0 M0 invasive ductal carcinoma ER positive PR 45%, HER-2 negative Pathologic stage: T1b N0 M0 ER 100% PR 45% HER-2 negative ratio 1.1 margins clear, 3 sentinel nodes negative Oncotype DX recurrence score 16, 10% risk of recurrence  Current treatment: Tamoxifen 20 mg daily started January 2016  switched to anastrozoleJuly 2017 when she became menopausal  Anastrozole toxicities:  Surveillance: Physical examination of the reconstructed breast does not show any palpable concerns Return to clinic in 1 year for follow-up 

## 2016-08-13 ENCOUNTER — Ambulatory Visit (INDEPENDENT_AMBULATORY_CARE_PROVIDER_SITE_OTHER): Payer: BLUE CROSS/BLUE SHIELD | Admitting: Gynecology

## 2016-08-13 ENCOUNTER — Encounter: Payer: Self-pay | Admitting: Gynecology

## 2016-08-13 VITALS — BP 118/76 | Ht 63.5 in | Wt 112.0 lb

## 2016-08-13 DIAGNOSIS — N926 Irregular menstruation, unspecified: Secondary | ICD-10-CM

## 2016-08-13 DIAGNOSIS — C50919 Malignant neoplasm of unspecified site of unspecified female breast: Secondary | ICD-10-CM

## 2016-08-13 DIAGNOSIS — N952 Postmenopausal atrophic vaginitis: Secondary | ICD-10-CM | POA: Diagnosis not present

## 2016-08-13 DIAGNOSIS — Z01411 Encounter for gynecological examination (general) (routine) with abnormal findings: Secondary | ICD-10-CM | POA: Diagnosis not present

## 2016-08-13 DIAGNOSIS — E079 Disorder of thyroid, unspecified: Secondary | ICD-10-CM | POA: Diagnosis not present

## 2016-08-13 DIAGNOSIS — E559 Vitamin D deficiency, unspecified: Secondary | ICD-10-CM

## 2016-08-13 LAB — COMPREHENSIVE METABOLIC PANEL
ALT: 18 U/L (ref 6–29)
AST: 19 U/L (ref 10–35)
Albumin: 4.5 g/dL (ref 3.6–5.1)
Alkaline Phosphatase: 58 U/L (ref 33–130)
BUN: 14 mg/dL (ref 7–25)
CO2: 23 mmol/L (ref 20–31)
Calcium: 9.9 mg/dL (ref 8.6–10.4)
Chloride: 103 mmol/L (ref 98–110)
Creat: 0.73 mg/dL (ref 0.50–1.05)
Glucose, Bld: 83 mg/dL (ref 65–99)
Potassium: 4 mmol/L (ref 3.5–5.3)
Sodium: 139 mmol/L (ref 135–146)
Total Bilirubin: 0.6 mg/dL (ref 0.2–1.2)
Total Protein: 7.3 g/dL (ref 6.1–8.1)

## 2016-08-13 LAB — CBC WITH DIFFERENTIAL/PLATELET
Basophils Absolute: 42 cells/uL (ref 0–200)
Basophils Relative: 1 %
Eosinophils Absolute: 42 cells/uL (ref 15–500)
Eosinophils Relative: 1 %
HCT: 41.5 % (ref 35.0–45.0)
Hemoglobin: 13.7 g/dL (ref 11.7–15.5)
Lymphocytes Relative: 28 %
Lymphs Abs: 1176 cells/uL (ref 850–3900)
MCH: 29.7 pg (ref 27.0–33.0)
MCHC: 33 g/dL (ref 32.0–36.0)
MCV: 89.8 fL (ref 80.0–100.0)
MPV: 10.1 fL (ref 7.5–12.5)
Monocytes Absolute: 252 cells/uL (ref 200–950)
Monocytes Relative: 6 %
Neutro Abs: 2688 cells/uL (ref 1500–7800)
Neutrophils Relative %: 64 %
Platelets: 257 10*3/uL (ref 140–400)
RBC: 4.62 MIL/uL (ref 3.80–5.10)
RDW: 13 % (ref 11.0–15.0)
WBC: 4.2 10*3/uL (ref 3.8–10.8)

## 2016-08-13 NOTE — Progress Notes (Signed)
    Brandy [name] 08-21-1960 [id number]        56 y.o.  G3P3003 for annual exam.  Continues with irregular bleeding off and on where she will leading up to 2 weeks and have unexpected heavy clot-like bleeding. Has been evaluated in the past to include elevated FSH, sonohysterogram showing a thickened anterior wall endometrial echo with follow up hysteroscopy D&C showing benign secretory endometrium 10/2014. Initially had stopped bleeding after the hysteroscopy D&C but has resumed irregular bleeding.  Past medical history,surgical history, problem list, medications, allergies, family history and social history were all reviewed and documented as reviewed in the EPIC chart.  ROS:  Performed with pertinent positives and negatives included in the history, assessment and plan.   Additional significant findings :  None   Exam: Copywriter, advertising Vitals:   08/13/16 1114  BP: 118/76  Weight: 112 lb (50.8 kg)  Height: 5' 3.5" (1.613 m)   Body mass index is 19.53 kg/m.  General appearance:  Normal affect, orientation and appearance. Skin: Grossly normal HEENT: Without gross lesions.  No cervical or supraclavicular adenopathy. Thyroid normal.  Lungs:  Clear without wheezing, rales or rhonchi Cardiac: RR, without RMG Abdominal:  Soft, nontender, without masses, guarding, rebound, organomegaly or hernia Breasts:  Examined lying and sitting. Status post bilateral mastectomies with reconstruction and implants. No masses or axillary adenopathy. Pelvic:  Ext, BUS, Vagina: With atrophic changes  Cervix: With atrophic changes  Uterus: Anteverted, normal size, shape and contour, midline and mobile nontender   Adnexa: Without masses or tenderness    Anus and perineum: Normal   Rectovaginal: Normal sphincter tone without palpated masses or tenderness.    Assessment/Plan:  56 y.o. G81P3003 female for annual exam.   1. Continued irregular bleeding in the perimenopause. Recommend sonohysterogram for  endometrial assessment and patient will call and schedule. Not having other significant menopausal symptoms such as hot flushes, night sweats, vaginal dryness. 2. History of breast cancer status post bilateral mastectomies. Actively followed by oncology. Exam NED. Genetic screening negative. 3. Pap smear/HPV 2014. No Pap smear done today. No history of significant abnormal Pap smears. Plan repeat Pap smear approaching 5 year interval per current screening guidelines. 4. Colonoscopy 3 years ago. Repeat at their recommended interval. 5. DEXA never. Will plan further into the menopause. History of low vitamin D in the past. Check vitamin D level today. 6. Health maintenance. CBC, CMP, TSH due to history of hyperthyroidism in the past, vitamin D due to vitamin D deficiency in the past. No lipid profile as last year it was excellent. Follow up for sonohysterogram. Follow up in one year for annual exam.   [name] MD, 12:45 PM 08/13/2016

## 2016-08-13 NOTE — Patient Instructions (Signed)
Follow up for ultrasound as scheduled 

## 2016-08-14 ENCOUNTER — Other Ambulatory Visit: Payer: Self-pay | Admitting: Gynecology

## 2016-08-14 DIAGNOSIS — E559 Vitamin D deficiency, unspecified: Secondary | ICD-10-CM

## 2016-08-14 LAB — VITAMIN D 25 HYDROXY (VIT D DEFICIENCY, FRACTURES): Vit D, 25-Hydroxy: 21 ng/mL — ABNORMAL LOW (ref 30–100)

## 2016-08-14 LAB — TSH: TSH: 1.25 mIU/L

## 2016-08-14 MED ORDER — VITAMIN D (ERGOCALCIFEROL) 1.25 MG (50000 UNIT) PO CAPS: 50000.0000 [IU] | ORAL_CAPSULE | ORAL | 0 refills | Status: DC

## 2016-08-18 ENCOUNTER — Other Ambulatory Visit: Payer: Self-pay | Admitting: Gynecology

## 2016-08-18 DIAGNOSIS — N950 Postmenopausal bleeding: Secondary | ICD-10-CM

## 2016-08-27 ENCOUNTER — Ambulatory Visit
Admission: RE | Admit: 2016-08-27 | Discharge: 2016-08-27 | Disposition: A | Discharge status: Home / Self Care | Payer: BLUE CROSS/BLUE SHIELD | Source: Ambulatory Visit | Attending: General Surgery | Admitting: General Surgery

## 2016-08-27 DIAGNOSIS — C50512 Malignant neoplasm of lower-outer quadrant of left female breast: Secondary | ICD-10-CM

## 2016-08-28 ENCOUNTER — Ambulatory Visit: Payer: BLUE CROSS/BLUE SHIELD | Admitting: Gynecology

## 2016-08-28 ENCOUNTER — Other Ambulatory Visit: Payer: Self-pay | Admitting: Gynecology

## 2016-08-28 ENCOUNTER — Encounter: Payer: Self-pay | Admitting: Gynecology

## 2016-08-28 ENCOUNTER — Ambulatory Visit (INDEPENDENT_AMBULATORY_CARE_PROVIDER_SITE_OTHER): Payer: BLUE CROSS/BLUE SHIELD

## 2016-08-28 VITALS — BP 116/76

## 2016-08-28 DIAGNOSIS — N950 Postmenopausal bleeding: Secondary | ICD-10-CM

## 2016-08-28 DIAGNOSIS — N926 Irregular menstruation, unspecified: Secondary | ICD-10-CM

## 2016-08-28 DIAGNOSIS — D251 Intramural leiomyoma of uterus: Secondary | ICD-10-CM

## 2016-08-28 DIAGNOSIS — D259 Leiomyoma of uterus, unspecified: Secondary | ICD-10-CM

## 2016-08-28 DIAGNOSIS — N924 Excessive bleeding in the premenopausal period: Secondary | ICD-10-CM

## 2016-08-28 NOTE — Patient Instructions (Signed)
Office will call you with biopsy results 

## 2016-08-28 NOTE — Progress Notes (Addendum)
    Brandy [name] 1960/07/24 [id number]        56 y.o.  G3P3003 presents for sonohysterogram. History of repeated episodes of irregular bleeding. Was evaluated 2016 with sonohysterogram and ultimate hysteroscopy D&C showing benign secretory endometrium. Has had bleeding on and off since. [place] last year was 115.  Past medical history,surgical history, problem list, medications, allergies, family history and social history were all reviewed and documented in the EPIC chart.  Directed ROS with pertinent positives and negatives documented in the history of present illness/assessment and plan.  Exam: [place] assistant BP 116/76 General appearance:  Normal Abdomen soft nontender without masses guarding rebound Pelvic external BUS vagina with atrophic changes. Cervix with atrophic changes. Uterus normal size midline mobile nontender. Adnexa without masses or tenderness.  Ultrasound transvaginal and transabdominal shows uterus normal size with 2 small myomas 15 mm and 14 mm. Endometrial echo 4.4 mm. Right ovary with thin-walled echo-free follicle 9 x 4 mm. Left ovary normal. Cul-de-sac negative.  Sonohysterogram performed, sterile technique, easy catheter introduction, good distention with no abnormalities. Endometrial sample taken. Patient tolerated well  Assessment/Plan:  56 y.o. [id number] with perimenopausal bleeding. Endometrium is thin. Patient will follow up for endometrial biopsy results. Is currently on tamoxifen with her history of breast cancer. The issues of hormonal manipulation to try to control irregular bleeding in a patient with a history of breast cancer was reviewed. At this point my recommendation would be to monitor and follow expectantly as I anticipate she will stop bleeding at some point relatively soon even her [facility] of 115. Patient's comfortable with this approach for now. Will follow up for biopsy results.    Brandy Auerbach MD, 3:19 PM 08/28/2016

## 2016-09-01 ENCOUNTER — Telehealth: Payer: Self-pay

## 2016-09-01 NOTE — Telephone Encounter (Signed)
Patient asked if you would take a look at her breast u/s report form 08/27/16 and make recommendation. She said they said she needed to make a plan. She said she is not clear what she should do at this point. Wanted you opinion.

## 2016-09-02 NOTE — Telephone Encounter (Signed)
I would recommend she follow up with the surgeon who did her breast cancer surgery and let them examine her and give them their recommendation. From the ultrasound report certainly nothing looks concerning but the question is whether she should have palpable abnormalities removed regardless.

## 2016-09-02 NOTE — Telephone Encounter (Signed)
Patient informed. 

## 2016-09-12 ENCOUNTER — Other Ambulatory Visit: Payer: Self-pay | Admitting: General Surgery

## 2016-09-12 DIAGNOSIS — R599 Enlarged lymph nodes, unspecified: Secondary | ICD-10-CM

## 2016-09-26 ENCOUNTER — Ambulatory Visit
Admission: RE | Admit: 2016-09-26 | Discharge: 2016-09-26 | Disposition: A | Discharge status: Home / Self Care | Payer: BLUE CROSS/BLUE SHIELD | Source: Ambulatory Visit | Attending: General Surgery | Admitting: General Surgery

## 2016-09-26 DIAGNOSIS — R599 Enlarged lymph nodes, unspecified: Secondary | ICD-10-CM

## 2016-09-26 MED ORDER — GADOBENATE DIMEGLUMINE 529 MG/ML IV SOLN
10.0000 mL | Freq: Once | INTRAVENOUS | Status: AC | PRN
  Administered 2016-09-26: 10 mL via INTRAVENOUS

## 2016-11-08 ENCOUNTER — Other Ambulatory Visit: Payer: Self-pay | Admitting: Gynecology

## 2017-02-11 ENCOUNTER — Ambulatory Visit: Payer: BLUE CROSS/BLUE SHIELD | Admitting: Hematology and Oncology

## 2017-02-11 NOTE — Assessment & Plan Note (Deleted)
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%, PR 100%, HER-2 negative Pathological stage: T1 cN0 M0 ER 100%, PR 100%, HER-2 negative ratio 0.9, margins negative, 2 SLN negative  Left breast:Clinical T1 BN 0 M0 invasive ductal carcinoma ER positive PR 45%, HER-2 negative Pathologic stage: T1b N0 M0 ER 100% PR 45% HER-2 negative ratio 1.1 margins clear, 3 sentinel nodes negative Oncotype DX recurrence score 16, 10% risk of recurrence  Current treatment:Tamoxifen 20 mg daily started January 2016  switched to anastrozoleJuly 2017 stopped November 2017 because of hot flashes Now on tamoxifen 10 mg in the morning and 10 mg in the evening  Patient complains of multiple signs and symptoms of aches and pains.  Surveillance: Physical examination of the reconstructed breast does not show any palpable concerns MRI breast 09/26/2016: Benign findings  Return to clinic in 6 months for follow-up

## 2017-04-06 DIAGNOSIS — R05 Cough: Secondary | ICD-10-CM | POA: Diagnosis not present

## 2017-04-06 DIAGNOSIS — J111 Influenza due to unidentified influenza virus with other respiratory manifestations: Secondary | ICD-10-CM | POA: Diagnosis not present

## 2017-04-06 DIAGNOSIS — J019 Acute sinusitis, unspecified: Secondary | ICD-10-CM | POA: Diagnosis not present

## 2017-04-16 ENCOUNTER — Encounter: Payer: Self-pay | Admitting: Gynecology

## 2017-04-16 ENCOUNTER — Ambulatory Visit: Payer: BLUE CROSS/BLUE SHIELD | Admitting: Gynecology

## 2017-04-16 VITALS — BP 118/76

## 2017-04-16 DIAGNOSIS — R1032 Left lower quadrant pain: Secondary | ICD-10-CM | POA: Diagnosis not present

## 2017-04-16 NOTE — Progress Notes (Signed)
    Brandy [name] Mar 09, 1961 [id number]        56 y.o.  G3P3003 presents with several days of left sided flank pain.  Pressure to sharp stabbing coming and going.  Some urinary urgency.  No frequency dysuria low back pain fever or chills.  Some nausea.  No vomiting, diarrhea or constipation.  No vaginal bleeding.  She does note that it seems better since it initially started.  Past medical history,surgical history, problem list, medications, allergies, family history and social history were all reviewed and documented in the EPIC chart.  Directed ROS with pertinent positives and negatives documented in the history of present illness/assessment and plan.  Exam: [name] assistant Vitals:   04/16/17 1432  BP: 118/76   General appearance:  Normal Spine straight without CVA tenderness Abdomen soft nontender without masses guarding rebound Pelvic external BUS vagina with atrophic changes.  Cervix with atrophic changes.  Uterus grossly normal midline mobile nontender.  Adnexa without mass.  Assessment/Plan:  56 y.o. G3P3003 with left flank/side pain.  Exam unremarkable.  Urine analysis negative.  Discussed differential to include GI.  Feel high for GYN.  Recommend observation for now as it seems to be getting better.  If persists at all she will call beginning of next week and we will pursue ultrasound to rule out nonpalpable abnormalities.  If it completely clears and she is otherwise doing well then will plan follow-up at her next annual exam.  If ultrasound is normal and pain persists then will plan GI referral.  Greater than 50% of my time was spent in direct face to face counseling and coordination of care with the patient.     [name] MD, 2:49 PM 04/16/2017

## 2017-04-16 NOTE — Patient Instructions (Signed)
Call if your left sided pain continues and we will schedule an ultrasound.  Otherwise if it resolves then we will see you when you are due for your annual exam.

## 2017-04-16 NOTE — Addendum Note (Signed)
Addended by: Nelva Nay on: 04/16/2017 04:35 PM   Modules accepted: Orders

## 2017-04-17 LAB — URINALYSIS W MICROSCOPIC + REFLEX CULTURE
Bacteria, UA: NONE SEEN /HPF
Bilirubin Urine: NEGATIVE
Glucose, UA: NEGATIVE
Hgb urine dipstick: NEGATIVE
Hyaline Cast: NONE SEEN /LPF
Ketones, ur: NEGATIVE
Leukocyte Esterase: NEGATIVE
Nitrites, Initial: NEGATIVE
Protein, ur: NEGATIVE
RBC / HPF: NONE SEEN /HPF (ref 0–2)
Specific Gravity, Urine: 1.005 (ref 1.001–1.03)
WBC, UA: NONE SEEN /HPF (ref 0–5)
pH: 5.5 (ref 5.0–8.0)

## 2017-04-17 LAB — NO CULTURE INDICATED

## 2017-05-05 DIAGNOSIS — Z9889 Other specified postprocedural states: Secondary | ICD-10-CM | POA: Diagnosis not present

## 2017-05-22 DIAGNOSIS — Z1389 Encounter for screening for other disorder: Secondary | ICD-10-CM | POA: Diagnosis not present

## 2017-05-22 DIAGNOSIS — E0500 Thyrotoxicosis with diffuse goiter without thyrotoxic crisis or storm: Secondary | ICD-10-CM | POA: Diagnosis not present

## 2017-05-22 DIAGNOSIS — R1084 Generalized abdominal pain: Secondary | ICD-10-CM | POA: Diagnosis not present

## 2017-05-22 DIAGNOSIS — M2550 Pain in unspecified joint: Secondary | ICD-10-CM | POA: Diagnosis not present

## 2017-05-22 DIAGNOSIS — K635 Polyp of colon: Secondary | ICD-10-CM | POA: Diagnosis not present

## 2017-05-22 DIAGNOSIS — C50911 Malignant neoplasm of unspecified site of right female breast: Secondary | ICD-10-CM | POA: Diagnosis not present

## 2017-05-22 DIAGNOSIS — E559 Vitamin D deficiency, unspecified: Secondary | ICD-10-CM | POA: Diagnosis not present

## 2017-06-01 DIAGNOSIS — Z170 Estrogen receptor positive status [ER+]: Secondary | ICD-10-CM | POA: Diagnosis not present

## 2017-06-01 DIAGNOSIS — C50112 Malignant neoplasm of central portion of left female breast: Secondary | ICD-10-CM | POA: Diagnosis not present

## 2017-06-01 DIAGNOSIS — Z853 Personal history of malignant neoplasm of breast: Secondary | ICD-10-CM | POA: Diagnosis not present

## 2017-06-01 DIAGNOSIS — C50111 Malignant neoplasm of central portion of right female breast: Secondary | ICD-10-CM | POA: Diagnosis not present

## 2017-06-04 DIAGNOSIS — C50411 Malignant neoplasm of upper-outer quadrant of right female breast: Secondary | ICD-10-CM | POA: Diagnosis not present

## 2017-06-04 DIAGNOSIS — C50512 Malignant neoplasm of lower-outer quadrant of left female breast: Secondary | ICD-10-CM | POA: Diagnosis not present

## 2017-06-27 ENCOUNTER — Encounter (HOSPITAL_COMMUNITY): Payer: Self-pay | Admitting: *Deleted

## 2017-06-27 ENCOUNTER — Emergency Department (HOSPITAL_COMMUNITY)
Admission: EM | Admit: 2017-06-27 | Discharge: 2017-06-27 | Disposition: A | Discharge status: Home / Self Care | Payer: BLUE CROSS/BLUE SHIELD | Attending: Emergency Medicine | Admitting: Emergency Medicine

## 2017-06-27 ENCOUNTER — Telehealth: Payer: Self-pay | Admitting: Gastroenterology

## 2017-06-27 DIAGNOSIS — R109 Unspecified abdominal pain: Secondary | ICD-10-CM | POA: Insufficient documentation

## 2017-06-27 DIAGNOSIS — Z5321 Procedure and treatment not carried out due to patient leaving prior to being seen by health care provider: Secondary | ICD-10-CM | POA: Diagnosis not present

## 2017-06-27 LAB — COMPREHENSIVE METABOLIC PANEL
ALT: 51 U/L (ref 14–54)
AST: 61 U/L — ABNORMAL HIGH (ref 15–41)
Albumin: 4.1 g/dL (ref 3.5–5.0)
Alkaline Phosphatase: 70 U/L (ref 38–126)
Anion gap: 7 (ref 5–15)
BUN: 11 mg/dL (ref 6–20)
CO2: 25 mmol/L (ref 22–32)
Calcium: 9.4 mg/dL (ref 8.9–10.3)
Chloride: 106 mmol/L (ref 101–111)
Creatinine, Ser: 0.54 mg/dL (ref 0.44–1.00)
GFR calc Af Amer: >60 mL/min (ref >60)
GFR calc non Af Amer: >60 mL/min (ref >60)
Glucose, Bld: 127 mg/dL — ABNORMAL HIGH (ref 65–99)
Potassium: 3.6 mmol/L (ref 3.5–5.1)
Sodium: 138 mmol/L (ref 135–145)
Total Bilirubin: 0.5 mg/dL (ref 0.3–1.2)
Total Protein: 7.1 g/dL (ref 6.5–8.1)

## 2017-06-27 LAB — CBC
HCT: 37.7 % (ref 36.0–46.0)
Hemoglobin: 13.1 g/dL (ref 12.0–15.0)
MCH: 30.8 pg (ref 26.0–34.0)
MCHC: 34.7 g/dL (ref 30.0–36.0)
MCV: 88.5 fL (ref 78.0–100.0)
Platelets: 222 10*3/uL (ref 150–400)
RBC: 4.26 MIL/uL (ref 3.87–5.11)
RDW: 12.5 % (ref 11.5–15.5)
WBC: 8.3 10*3/uL (ref 4.0–10.5)

## 2017-06-27 NOTE — ED Notes (Signed)
Not in lobby for reasses

## 2017-06-27 NOTE — Telephone Encounter (Signed)
Patient called last night. States she is having severe pain in her anal area, can't get comfortable, can't have a bowel movement. I recommended she go to the ER for further evaluation - ensure she doesn't have a thrombosed hemorrhoid or perianal abscess, and rule out fissure.   She called back again this AM. She went to the ER at [facility] last night - she had blood work showing normal WBC, but AST mildly elevated. She left the ER without being seen unfortunately - states "it was a terrible experience, I could have died". She denies any RUQ pain. She states she has a history of elevated LFTs. I don't think her AST elevation is related to her perianal pain and reassured her. She continues to have severe perianal pain that she can't manage. At this time on the weekend I am recommending she go to the ER or urgent care as she needs a rectal exam to further evaluate her complaints. She spent several minutes telling me how she did not like the [employer] and would not go back to the ER. I told her I can't evaluate this issue over the phone and she needs a physical exam, if she is in that much pain she needs to seek care at urgent care or ED. She verbalized understanding but remained upset about her experience at [facility].  Dr. Collene Mares I told her I would pass this on to you, she would like to see you in clinic or have your office call her on Monday. Thanks

## 2017-06-27 NOTE — ED Triage Notes (Signed)
Pt states around 5pm yesterday she started having bad abdominal pain. She states that she has been straining to have a BM, but only a small amount of stool and bright red blood comes out.

## 2017-06-27 NOTE — ED Notes (Signed)
Not found in lobby

## 2017-06-29 DIAGNOSIS — R52 Pain, unspecified: Secondary | ICD-10-CM | POA: Diagnosis not present

## 2017-08-14 ENCOUNTER — Encounter: Payer: Self-pay | Admitting: Gynecology

## 2017-08-14 ENCOUNTER — Ambulatory Visit: Payer: BLUE CROSS/BLUE SHIELD | Admitting: Gynecology

## 2017-08-14 VITALS — BP 118/76 | Ht 64.0 in | Wt 116.0 lb

## 2017-08-14 DIAGNOSIS — E559 Vitamin D deficiency, unspecified: Secondary | ICD-10-CM

## 2017-08-14 DIAGNOSIS — N952 Postmenopausal atrophic vaginitis: Secondary | ICD-10-CM | POA: Diagnosis not present

## 2017-08-14 DIAGNOSIS — Z01411 Encounter for gynecological examination (general) (routine) with abnormal findings: Secondary | ICD-10-CM

## 2017-08-14 DIAGNOSIS — Z13220 Encounter for screening for lipoid disorders: Secondary | ICD-10-CM

## 2017-08-14 DIAGNOSIS — Z1151 Encounter for screening for human papillomavirus (HPV): Secondary | ICD-10-CM | POA: Diagnosis not present

## 2017-08-14 DIAGNOSIS — Z853 Personal history of malignant neoplasm of breast: Secondary | ICD-10-CM | POA: Diagnosis not present

## 2017-08-14 LAB — COMPREHENSIVE METABOLIC PANEL
AG Ratio: 1.9 (calc) (ref 1.0–2.5)
ALT: 32 U/L — ABNORMAL HIGH (ref 6–29)
AST: 26 U/L (ref 10–35)
Albumin: 4.6 g/dL (ref 3.6–5.1)
Alkaline phosphatase (APISO): 74 U/L (ref 33–130)
BUN: 12 mg/dL (ref 7–25)
CO2: 30 mmol/L (ref 20–32)
Calcium: 9.7 mg/dL (ref 8.6–10.4)
Chloride: 104 mmol/L (ref 98–110)
Creat: 0.93 mg/dL (ref 0.50–1.05)
Globulin: 2.4 g/dL (calc) (ref 1.9–3.7)
Glucose, Bld: 70 mg/dL (ref 65–99)
Potassium: 3.7 mmol/L (ref 3.5–5.3)
Sodium: 139 mmol/L (ref 135–146)
Total Bilirubin: 0.7 mg/dL (ref 0.2–1.2)
Total Protein: 7 g/dL (ref 6.1–8.1)

## 2017-08-14 LAB — LIPID PANEL
Cholesterol: 208 mg/dL — ABNORMAL HIGH (ref <200)
HDL: 74 mg/dL (ref >50)
LDL Cholesterol (Calc): 118 mg/dL (calc) — ABNORMAL HIGH
Non-HDL Cholesterol (Calc): 134 mg/dL (calc) — ABNORMAL HIGH (ref <130)
Total CHOL/HDL Ratio: 2.8 (calc) (ref <5.0)
Triglycerides: 67 mg/dL (ref <150)

## 2017-08-14 NOTE — Progress Notes (Signed)
    Brandy [name] Jul 12, 1960 [id number]        57 y.o.  G3P3003 for annual gynecologic exam.  Without gynecologic complaints  Past medical history,surgical history, problem list, medications, allergies, family history and social history were all reviewed and documented as reviewed in the EPIC chart.  ROS:  Performed with pertinent positives and negatives included in the history, assessment and plan.   Additional significant findings : None    Exam: Brandy [name] assistant Vitals:   08/14/17 1107  BP: 118/76  Weight: 116 lb (52.6 kg)  Height: 5\' 4"  (1.626 m)   Body mass index is 19.91 kg/m.  General appearance:  Normal affect, orientation and appearance. Skin: Grossly normal HEENT: Without gross lesions.  No cervical or supraclavicular adenopathy. Thyroid normal.  Lungs:  Clear without wheezing, rales or rhonchi Cardiac: RR, without RMG Abdominal:  Soft, nontender, without masses, guarding, rebound, organomegaly or hernia Breasts:  Examined lying and sitting.  Status post bilateral mastectomies with reconstruction without masses or axillary adenopathy. Pelvic:  Ext, BUS, Vagina: With atrophic changes  Cervix: With atrophic changes.  Pap smear done  Uterus: Anteverted, normal size, shape and contour, midline and mobile nontender   Adnexa: Without masses or tenderness    Anus and perineum: Normal   Rectovaginal: Normal sphincter tone without palpated masses or tenderness.    Assessment/Plan:  57 y.o. G42P3003 female for annual gynecologic exam.   1. Postmenopausal/atrophic genital changes.  No significant hot flushes, night sweats, vaginal dryness or any vaginal bleeding.  Had been evaluated previously for postmenopausal bleeding but is done no bleeding since. 2. History of breast cancer status post bilateral mastectomies with reconstruction.  Exam NED.  Genetic screening negative.  Continue to follow-up with oncology. 3. Pap smear/HPV 2014.  Pap smear/HPV today.  No history of  abnormal Pap smears previously.  We will continue with every 5-year screening per current screening guidelines. 4. Colonoscopy 2018.  Repeat at their recommended interval. 5. DEXA never.  Will plan further into the menopause. 6. Health maintenance.  Recent CBC normal.  Comprehensive metabolic panel did show mildly elevated glucose and AST.  Repeat comprehensive metabolic panel now and lipid profile.  We will also check vitamin D level where she was in the 20 range last year.  Otherwise patient will follow-up in 1 year, sooner as needed.   Brandy [name], 11:34 AM 08/14/2017

## 2017-08-14 NOTE — Patient Instructions (Signed)
Follow up in one year for annual exam 

## 2017-08-14 NOTE — Addendum Note (Signed)
Addended by: Nelva Nay on: 08/14/2017 11:46 AM   Modules accepted: Orders

## 2017-08-15 LAB — VITAMIN D 25 HYDROXY (VIT D DEFICIENCY, FRACTURES): Vit D, 25-Hydroxy: 49 ng/mL (ref 30–100)

## 2017-08-17 ENCOUNTER — Encounter: Payer: Self-pay | Admitting: Gynecology

## 2017-08-17 LAB — PAP IG AND HPV HIGH-RISK: HPV DNA High Risk: NOT DETECTED

## 2017-09-11 DIAGNOSIS — L814 Other melanin hyperpigmentation: Secondary | ICD-10-CM | POA: Diagnosis not present

## 2017-09-11 DIAGNOSIS — L738 Other specified follicular disorders: Secondary | ICD-10-CM | POA: Diagnosis not present

## 2017-09-11 DIAGNOSIS — L821 Other seborrheic keratosis: Secondary | ICD-10-CM | POA: Diagnosis not present

## 2017-09-22 DIAGNOSIS — Z9889 Other specified postprocedural states: Secondary | ICD-10-CM | POA: Diagnosis not present

## 2017-12-09 ENCOUNTER — Telehealth: Payer: Self-pay | Admitting: Hematology and Oncology

## 2017-12-09 NOTE — Telephone Encounter (Signed)
Tried to reach regarding vociemail °

## 2017-12-10 ENCOUNTER — Telehealth: Payer: Self-pay | Admitting: Hematology and Oncology

## 2017-12-10 NOTE — Telephone Encounter (Signed)
Patient called to reschedule  °

## 2017-12-14 DIAGNOSIS — R109 Unspecified abdominal pain: Secondary | ICD-10-CM | POA: Diagnosis not present

## 2017-12-16 ENCOUNTER — Ambulatory Visit
Admission: RE | Admit: 2017-12-16 | Discharge: 2017-12-16 | Disposition: A | Discharge status: Home / Self Care | Payer: BLUE CROSS/BLUE SHIELD | Source: Ambulatory Visit | Attending: Registered Nurse | Admitting: Registered Nurse

## 2017-12-16 ENCOUNTER — Other Ambulatory Visit: Payer: Self-pay | Admitting: Registered Nurse

## 2017-12-16 DIAGNOSIS — R109 Unspecified abdominal pain: Secondary | ICD-10-CM

## 2017-12-17 ENCOUNTER — Ambulatory Visit: Payer: BLUE CROSS/BLUE SHIELD | Admitting: Gynecology

## 2017-12-17 ENCOUNTER — Other Ambulatory Visit: Payer: Self-pay | Admitting: Gynecology

## 2017-12-17 ENCOUNTER — Encounter: Payer: Self-pay | Admitting: Gynecology

## 2017-12-17 ENCOUNTER — Telehealth: Payer: Self-pay

## 2017-12-17 VITALS — BP 110/82

## 2017-12-17 DIAGNOSIS — R1030 Lower abdominal pain, unspecified: Secondary | ICD-10-CM | POA: Diagnosis not present

## 2017-12-17 DIAGNOSIS — R350 Frequency of micturition: Secondary | ICD-10-CM | POA: Diagnosis not present

## 2017-12-17 NOTE — Telephone Encounter (Signed)
Patient said she was in earlier and you told her that she could do a pelvic u/s to check her ovaries. Patient said she has decided to be safe she would like to do that. Ok to order?

## 2017-12-17 NOTE — Telephone Encounter (Signed)
Brandy [name] called her and schedule an u/s appt. Order placed.

## 2017-12-17 NOTE — Progress Notes (Signed)
    Brandy [name] 1960/06/28 [id number]        57 y.o.  G3P3003 presents complaining of lower abdominal pain that radiates to her back.  Also having some nausea.  Notes some urinary frequency but no dysuria urgency fever or chills.  No vaginal discharge itching irritation odor or any vaginal bleeding.  Was evaluated by Dr. Forde Dandy to include a CT scan which was normal noting reproductive section showed normal uterus and adnexa.  She was referred to see me just to make sure we did not feel she had a gynecologic etiology of her pain.  Past medical history,surgical history, problem list, medications, allergies, family history and social history were all reviewed and documented in the EPIC chart.  Directed ROS with pertinent positives and negatives documented in the history of present illness/assessment and plan.  Exam: Wandra Scot assistant Vitals:   12/17/17 1212  BP: 110/82   General appearance:  Normal Spine straight without CVA tenderness Abdomen soft nontender without masses guarding rebound Pelvic external BUS vagina with atrophic changes.  Cervix with atrophic changes.  Uterus grossly normal size midline mobile nontender.  Adnexa without masses or tenderness.  Rectal exam normal.  Assessment/Plan:  57 y.o. [id number] with history as above.  Gynecologic exam is normal.  CT scan showed a normal pelvis.  Do not feel her pain is of GYN etiology.  Urine analysis today is unremarkable but I am going to go ahead and culture it regardless just to make sure.  She notes that her urine was also checked at Dr. Baldwin Crown office and was normal historically.  Recommend patient follow-up with Dr. Forde Dandy for further work-up of her pain.  Possible orthopedic versus GI etiologies discussed with the patient.  I spent a total of 15 face-to-face minutes with the patient, over 50% was spent counseling and coordination of care.   [name] MD, 12:53 PM 12/17/2017

## 2017-12-17 NOTE — Patient Instructions (Signed)
Follow-up with Dr. Forde Dandy for further evaluation of your pain.

## 2017-12-17 NOTE — Telephone Encounter (Signed)
Spoke with patient and informed her. °

## 2017-12-17 NOTE — Telephone Encounter (Signed)
Okay to order GYN ultrasound

## 2017-12-18 LAB — URINALYSIS, COMPLETE W/RFL CULTURE
Bacteria, UA: NONE SEEN /HPF
Bilirubin Urine: NEGATIVE
Glucose, UA: NEGATIVE
Hgb urine dipstick: NEGATIVE
Hyaline Cast: NONE SEEN /LPF
Ketones, ur: NEGATIVE
Leukocyte Esterase: NEGATIVE
Nitrites, Initial: NEGATIVE
Protein, ur: NEGATIVE
RBC / HPF: NONE SEEN /HPF (ref 0–2)
Specific Gravity, Urine: 1.01 (ref 1.001–1.03)
WBC, UA: NONE SEEN /HPF (ref 0–5)
pH: 7 (ref 5.0–8.0)

## 2017-12-18 LAB — NO CULTURE INDICATED

## 2017-12-19 ENCOUNTER — Other Ambulatory Visit: Payer: Self-pay | Admitting: Registered Nurse

## 2017-12-19 DIAGNOSIS — R109 Unspecified abdominal pain: Secondary | ICD-10-CM

## 2017-12-23 DIAGNOSIS — R1084 Generalized abdominal pain: Secondary | ICD-10-CM | POA: Diagnosis not present

## 2017-12-23 DIAGNOSIS — E0500 Thyrotoxicosis with diffuse goiter without thyrotoxic crisis or storm: Secondary | ICD-10-CM | POA: Diagnosis not present

## 2017-12-25 ENCOUNTER — Other Ambulatory Visit: Payer: Self-pay | Admitting: *Deleted

## 2017-12-25 DIAGNOSIS — R1030 Lower abdominal pain, unspecified: Secondary | ICD-10-CM

## 2017-12-28 ENCOUNTER — Telehealth: Payer: Self-pay | Admitting: Hematology and Oncology

## 2017-12-28 ENCOUNTER — Encounter: Payer: Self-pay | Admitting: Hematology and Oncology

## 2017-12-28 ENCOUNTER — Inpatient Hospital Stay: Payer: BLUE CROSS/BLUE SHIELD | Attending: Hematology and Oncology | Admitting: Hematology and Oncology

## 2017-12-28 ENCOUNTER — Inpatient Hospital Stay: Payer: BLUE CROSS/BLUE SHIELD

## 2017-12-28 VITALS — BP 144/83 | HR 84 | Temp 97.5°F | Resp 18 | Ht 64.0 in | Wt 116.6 lb

## 2017-12-28 DIAGNOSIS — T732XXA Exhaustion due to exposure, initial encounter: Secondary | ICD-10-CM

## 2017-12-28 DIAGNOSIS — Z79810 Long term (current) use of selective estrogen receptor modulators (SERMs): Secondary | ICD-10-CM | POA: Diagnosis not present

## 2017-12-28 DIAGNOSIS — Z9013 Acquired absence of bilateral breasts and nipples: Secondary | ICD-10-CM | POA: Diagnosis not present

## 2017-12-28 DIAGNOSIS — R590 Localized enlarged lymph nodes: Secondary | ICD-10-CM | POA: Insufficient documentation

## 2017-12-28 DIAGNOSIS — E0500 Thyrotoxicosis with diffuse goiter without thyrotoxic crisis or storm: Secondary | ICD-10-CM | POA: Insufficient documentation

## 2017-12-28 DIAGNOSIS — M549 Dorsalgia, unspecified: Secondary | ICD-10-CM | POA: Insufficient documentation

## 2017-12-28 DIAGNOSIS — R5383 Other fatigue: Secondary | ICD-10-CM | POA: Diagnosis not present

## 2017-12-28 DIAGNOSIS — Z170 Estrogen receptor positive status [ER+]: Secondary | ICD-10-CM | POA: Diagnosis not present

## 2017-12-28 DIAGNOSIS — C50412 Malignant neoplasm of upper-outer quadrant of left female breast: Secondary | ICD-10-CM

## 2017-12-28 DIAGNOSIS — R229 Localized swelling, mass and lump, unspecified: Secondary | ICD-10-CM | POA: Diagnosis not present

## 2017-12-28 DIAGNOSIS — Z79899 Other long term (current) drug therapy: Secondary | ICD-10-CM | POA: Insufficient documentation

## 2017-12-28 DIAGNOSIS — C50411 Malignant neoplasm of upper-outer quadrant of right female breast: Secondary | ICD-10-CM | POA: Insufficient documentation

## 2017-12-28 DIAGNOSIS — K921 Melena: Secondary | ICD-10-CM

## 2017-12-28 LAB — CBC WITH DIFFERENTIAL (CANCER CENTER ONLY)
Basophils Absolute: 0 10*3/uL (ref 0.0–0.1)
Basophils Relative: 1 %
Eosinophils Absolute: 0 10*3/uL (ref 0.0–0.5)
Eosinophils Relative: 1 %
HCT: 41.5 % (ref 34.8–46.6)
Hemoglobin: 14 g/dL (ref 11.6–15.9)
Lymphocytes Relative: 26 %
Lymphs Abs: 1.2 10*3/uL (ref 0.9–3.3)
MCH: 30.8 pg (ref 25.1–34.0)
MCHC: 33.7 g/dL (ref 31.5–36.0)
MCV: 91.4 fL (ref 79.5–101.0)
Monocytes Absolute: 0.3 10*3/uL (ref 0.1–0.9)
Monocytes Relative: 6 %
Neutro Abs: 3 10*3/uL (ref 1.5–6.5)
Neutrophils Relative %: 66 %
Platelet Count: 231 10*3/uL (ref 145–400)
RBC: 4.54 MIL/uL (ref 3.70–5.45)
RDW: 12.7 % (ref 11.2–14.5)
WBC Count: 4.5 10*3/uL (ref 3.9–10.3)

## 2017-12-28 LAB — CMP (CANCER CENTER ONLY)
ALT: 30 U/L (ref 0–44)
AST: 23 U/L (ref 15–41)
Albumin: 4.3 g/dL (ref 3.5–5.0)
Alkaline Phosphatase: 74 U/L (ref 38–126)
Anion gap: 10 (ref 5–15)
BUN: 13 mg/dL (ref 6–20)
CO2: 28 mmol/L (ref 22–32)
Calcium: 9.6 mg/dL (ref 8.9–10.3)
Chloride: 105 mmol/L (ref 98–111)
Creatinine: 0.8 mg/dL (ref 0.44–1.00)
GFR, Est AFR Am: >60 mL/min (ref >60)
GFR, Estimated: >60 mL/min (ref >60)
Glucose, Bld: 88 mg/dL (ref 70–99)
Potassium: 3.7 mmol/L (ref 3.5–5.1)
Sodium: 143 mmol/L (ref 135–145)
Total Bilirubin: 0.5 mg/dL (ref 0.3–1.2)
Total Protein: 7.5 g/dL (ref 6.5–8.1)

## 2017-12-28 LAB — MAGNESIUM: Magnesium: 1.8 mg/dL (ref 1.7–2.4)

## 2017-12-28 NOTE — Progress Notes (Signed)
Patient Care Team: Reynold Bowen, MD as PCP - General (Endocrinology) Phineas Real Belinda Block, MD as Consulting Physician (Gynecology) Jovita Kussmaul, MD as Consulting Physician (General Surgery) Nicholas Lose, MD as Consulting Physician (Hematology and Oncology)  DIAGNOSIS:  Encounter Diagnoses  Name Primary?  . 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 Fatigue due to exposure, initial encounter     SUMMARY OF ONCOLOGIC HISTORY:   Bilateral breast cancer ([place])   01/02/2014 Mammogram    Right breast abnormalities that were evaluated by ultrasound showing hypoechoic mass right breast 10:00 subareolar, at 9:00 position intramammary lymph node 0.85 cm    01/10/2014 Initial Diagnosis    Breast cancer of upper-outer quadrant of right female breast: Invasive ductal carcinoma with DCIS ER 100%, PR 100%, Ki-67 10%, HER-2 negative ratio 0.9    01/16/2014 Breast MRI    Right breast  1.4 x 1.4 x 1.5 cm inferomedially linear non-mass enhancement extending to the nipple 2 cm (biopsied on 01/10/14 IDC ER/PR 100% Ki-67 10%) : Left breast 6 mm oval Mass, no lymph nodes.    01/20/2014 Procedure    Left breast 6:00 position biopsy: IDC with DCIS ER 100%, PR 45%, Ki-67 5%, HER-2 negative ratio 1.13; clinical T1 B. N0 M0 stage IA    02/06/2014 Procedure    BRCA1 and 2 are negative, Breast Next panel Testing was normal and did not reveal any clearly harmful mutation in these genes. The genes tested were ATM, BARD1, BRCA1, BRCA2, BRIP1, CDH1, CHEK2, MRE11A, MUTYH, NBN, NF1, PALB2, PTEN, RAD50, RAD51C, RAD51D,    04/26/2014 Surgery    Bilateral mastectomies with reconstruction, right breast 1.2 cm, ER 100%, PR 100%, HER-2 negative ratio 0.9, 2 sentinel nodes negative T1 cN0 M0 stage IA, left breast 0.8 cm ER/PR positive HER-2 -3 sentinel nodes negative T1b N0 M0 stage IA    05/26/2014 -  Anti-estrogen oral therapy    Tamoxifen 20 mg daily  switched to anastrozole 1 mg daily 11/23/2015 stopped November 2017 and then started back on tamoxifen 08/11/2016     CHIEF COMPLIANT: Unable to tolerate tamoxifen, multiple symptoms  INTERVAL HISTORY: Brandy [name] is a 9-year with above-mentioned history of right breast cancer currently on surveillance.  She could not tolerate tamoxifen even at 10 mg dosage.  She is currently off all treatment.  She is here today accompanied by her daughter with multiple complaints.  She complains of swelling around the eyes.  She also complained of blood in the stool.  She has gastroenterology Dr. Collene Mares following her.  She has a small patch of skin irritability on the right side of the face.  REVIEW OF SYSTEMS:   Constitutional: Denies fevers, chills or abnormal weight loss Eyes: Denies blurriness of vision Ears, nose, mouth, throat, and face: Denies mucositis or sore throat Respiratory: Denies cough, dyspnea or wheezes Cardiovascular: Denies palpitation, chest discomfort Gastrointestinal:  Denies nausea, heartburn or change in bowel habits Skin: Denies abnormal skin rashes Lymphatics: Denies new lymphadenopathy or easy bruising Neurological:Denies numbness, tingling or new weaknesses Behavioral/Psych: Mood is stable, no new changes  Extremities: No lower extremity edema  All other systems were reviewed with the patient and are negative.  I have reviewed the past medical history, past surgical history, social history and family history with the patient and they are unchanged from previous note.  ALLERGIES:  is allergic to zofran [ondansetron hcl].  MEDICATIONS:  Current Outpatient Medications  Medication Sig  Dispense Refill  . albuterol (PROVENTIL HFA;VENTOLIN HFA) 108 (90 BASE) MCG/ACT inhaler Inhale 2 puffs into the lungs every 6 (six) hours as needed for wheezing or shortness of breath (asthma).    . ALPRAZolam (XANAX) 0.5 MG tablet Take 0.5 mg by mouth 4 (four) times daily as needed for anxiety  or sleep. Take 1/2 to 1 tablet daily as needed for acute anxiety/insomnia    . tamoxifen (NOLVADEX) 20 MG tablet Take 1 tablet (20 mg total) by mouth daily. (Patient not taking: Reported on 12/17/2017) 90 tablet 3   No current facility-administered medications for this visit.     PHYSICAL EXAMINATION: ECOG PERFORMANCE STATUS: 1 - Symptomatic but completely ambulatory  Vitals:   12/28/17 1408  BP: (!) 144/83  Pulse: 84  Resp: 18  Temp: (!) 97.5 F (36.4 C)  SpO2: 100%   Filed Weights   12/28/17 1408  Weight: 116 lb 9.6 oz (52.9 kg)    GENERAL:alert, no distress and comfortable SKIN: skin color, texture, turgor are normal, no rashes or significant lesions EYES: normal, Conjunctiva are pink and non-injected, sclera clear OROPHARYNX:no exudate, no erythema and lips, buccal mucosa, and tongue normal  NECK: supple, thyroid normal size, non-tender, without nodularity LYMPH:  no palpable lymphadenopathy in the cervical, axillary or inguinal LUNGS: clear to auscultation and percussion with normal breathing effort HEART: regular rate & rhythm and no murmurs and no lower extremity edema ABDOMEN:abdomen soft, non-tender and normal bowel sounds MUSCULOSKELETAL:no cyanosis of digits and no clubbing  NEURO: alert & oriented x 3 with fluent speech, no focal motor/sensory deficits EXTREMITIES: No lower extremity edema   LABORATORY DATA:  I have reviewed the data as listed CMP Latest Ref Rng & Units 08/14/2017 06/27/2017 08/13/2016  Glucose 65 - 99 mg/dL 70 127(H) 83  BUN 7 - 25 mg/dL '12 11 14  ' Creatinine 0.50 - 1.05 mg/dL 0.93 0.54 0.73  Sodium 135 - 146 mmol/L 139 138 139  Potassium 3.5 - 5.3 mmol/L 3.7 3.6 4.0  Chloride 98 - 110 mmol/L 104 106 103  CO2 20 - 32 mmol/L '30 25 23  ' Calcium 8.6 - 10.4 mg/dL 9.7 9.4 9.9  Total Protein 6.1 - 8.1 g/dL 7.0 7.1 7.3  Total Bilirubin 0.2 - 1.2 mg/dL 0.7 0.5 0.6  Alkaline Phos 38 - 126 U/L - 70 58  AST 10 - 35 U/L 26 61(H) 19  ALT 6 - 29 U/L 32(H) 51  18    Lab Results  Component Value Date   WBC 8.3 06/27/2017   HGB 13.1 06/27/2017   HCT 37.7 06/27/2017   MCV 88.5 06/27/2017   PLT 222 06/27/2017   NEUTROABS 2,688 08/13/2016    ASSESSMENT & PLAN:  Bilateral breast cancer (HCC) 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%, PR 100%, HER-2 negative Pathological stage: T1 cN0 M0 ER 100%, PR 100%, HER-2 negative ratio 0.9, margins negative, 2 SLN negative  Left breast:Clinical T1 BN 0 M0 invasive ductal carcinoma ER positive PR 45%, HER-2 negative Pathologic stage: T1b N0 M0 ER 100% PR 45% HER-2 negative ratio 1.1 margins clear, 3 sentinel nodes negative Oncotype DX recurrence score 16, 10% risk of recurrence  Current treatment:Tamoxifen 20 mg daily started January 2016  switched to anastrozoleJuly 2017 stopped November 2017 because of hot flashes, could not tolerate tamoxifen even at 10 mg dose and she discontinued it  Multiple complaints 1.  Back pain: Recommended doing a bone scan 2. left axillary nodules: Obtain ultrasound 3.  Blood in the stool: I sent a message to Dr. Collene Mares 4.  Swelling around the eyes: Obtained TSH CBC CMP today Patient has history of Graves' disease.  Previously when I had ordered scans she would refuse to get them done.  Return to clinic in 2 weeks for follow-up to discuss the results of the scans    Orders Placed This Encounter  Procedures  . NM Bone Scan Whole Body    Standing Status:   Future    Standing Expiration Date:   12/28/2018    Order Specific Question:   ** REASON FOR EXAM (FREE TEXT)    Answer:   Bone pain with H/O breast cancer    Order Specific Question:   If indicated for the ordered procedure, I authorize the administration of a radiopharmaceutical per Radiology protocol    Answer:   Yes    Order Specific Question:   Is the patient pregnant?    Answer:   No    Order Specific Question:   Preferred imaging location?    Answer:   [facility]     Order Specific Question:   Radiology Contrast Protocol - do NOT remove file path    Answer:   \\[email or web address]  . MM DIAG BREAST TOMO UNI LEFT    Standing Status:   Future    Standing Expiration Date:   12/29/2018    Order Specific Question:   Reason for Exam (SYMPTOM  OR DIAGNOSIS REQUIRED)    Answer:   Left axillary lumps    Order Specific Question:   Is the patient pregnant?    Answer:   No    Order Specific Question:   Preferred imaging location?    Answer:   [facility]  . CBC with Differential (Cancer Center Only)    Standing Status:   Future    Standing Expiration Date:   12/29/2018  . CMP ([place] only)    Standing Status:   Future    Standing Expiration Date:   12/29/2018  . Magnesium    Standing Status:   Future    Standing Expiration Date:   12/28/2018  . TSH    Standing Status:   Future    Standing Expiration Date:   12/28/2018   The patient has a good understanding of the overall plan. she agrees with it. she will call with any problems that may develop before the next visit here.   Harriette Ohara, MD 12/28/17

## 2017-12-28 NOTE — Telephone Encounter (Signed)
Patient sent back to lab and given avs report and appointments for August. [facility] will call patient re scans.

## 2017-12-28 NOTE — Assessment & Plan Note (Addendum)
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%, PR 100%, HER-2 negative Pathological stage: T1 cN0 M0 ER 100%, PR 100%, HER-2 negative ratio 0.9, margins negative, 2 SLN negative  Left breast:Clinical T1 BN 0 M0 invasive ductal carcinoma ER positive PR 45%, HER-2 negative Pathologic stage: T1b N0 M0 ER 100% PR 45% HER-2 negative ratio 1.1 margins clear, 3 sentinel nodes negative Oncotype DX recurrence score 16, 10% risk of recurrence  Current treatment:Tamoxifen 20 mg daily started January 2016  switched to anastrozoleJuly 2017 stopped November 2017 because of hot flashes, could not tolerate tamoxifen even at 10 mg dose and she discontinued it  Multiple complaints 1.  Back pain: Recommended doing a bone scan 2. left axillary nodules: Obtain ultrasound 3.  Blood in the stool: I sent a message to Dr. Collene Mares 4.  Swelling around the eyes: Obtained TSH CBC CMP today Patient has history of Graves' disease.  Previously when I had ordered scans she would refuse to get them done.  Return to clinic in 2 weeks for follow-up to discuss the results of the scans

## 2017-12-29 ENCOUNTER — Telehealth: Payer: Self-pay | Admitting: Hematology and Oncology

## 2017-12-29 ENCOUNTER — Other Ambulatory Visit: Payer: Self-pay

## 2017-12-29 DIAGNOSIS — C50011 Malignant neoplasm of nipple and areola, right female breast: Secondary | ICD-10-CM

## 2017-12-29 DIAGNOSIS — C50012 Malignant neoplasm of nipple and areola, left female breast: Principal | ICD-10-CM

## 2017-12-29 LAB — TSH: TSH: 0.757 u[IU]/mL (ref 0.308–3.960)

## 2017-12-29 NOTE — Progress Notes (Signed)
na

## 2017-12-29 NOTE — Telephone Encounter (Signed)
I called the patient to tell her that the TSH is normal. However I got the phone response at the mailbox was full and could not receive any messages.

## 2017-12-30 ENCOUNTER — Other Ambulatory Visit: Payer: Self-pay

## 2017-12-30 DIAGNOSIS — C50011 Malignant neoplasm of nipple and areola, right female breast: Secondary | ICD-10-CM

## 2017-12-30 DIAGNOSIS — C50012 Malignant neoplasm of nipple and areola, left female breast: Principal | ICD-10-CM

## 2017-12-31 ENCOUNTER — Other Ambulatory Visit: Payer: Self-pay | Admitting: Hematology and Oncology

## 2017-12-31 ENCOUNTER — Other Ambulatory Visit: Payer: Self-pay

## 2017-12-31 DIAGNOSIS — C50012 Malignant neoplasm of nipple and areola, left female breast: Principal | ICD-10-CM

## 2017-12-31 DIAGNOSIS — C50011 Malignant neoplasm of nipple and areola, right female breast: Secondary | ICD-10-CM

## 2018-01-01 ENCOUNTER — Other Ambulatory Visit: Payer: Self-pay | Admitting: Adult Health

## 2018-01-01 ENCOUNTER — Other Ambulatory Visit: Payer: Self-pay

## 2018-01-01 DIAGNOSIS — C50011 Malignant neoplasm of nipple and areola, right female breast: Secondary | ICD-10-CM

## 2018-01-01 DIAGNOSIS — C50012 Malignant neoplasm of nipple and areola, left female breast: Principal | ICD-10-CM

## 2018-01-04 ENCOUNTER — Ambulatory Visit
Admission: RE | Admit: 2018-01-04 | Discharge: 2018-01-04 | Disposition: A | Discharge status: Home / Self Care | Payer: BLUE CROSS/BLUE SHIELD | Source: Ambulatory Visit | Attending: Hematology and Oncology | Admitting: Hematology and Oncology

## 2018-01-04 ENCOUNTER — Other Ambulatory Visit: Payer: Self-pay

## 2018-01-04 ENCOUNTER — Other Ambulatory Visit: Payer: Self-pay | Admitting: Adult Health

## 2018-01-04 ENCOUNTER — Other Ambulatory Visit: Payer: Self-pay | Admitting: Hematology and Oncology

## 2018-01-04 DIAGNOSIS — C50011 Malignant neoplasm of nipple and areola, right female breast: Secondary | ICD-10-CM

## 2018-01-04 DIAGNOSIS — C50012 Malignant neoplasm of nipple and areola, left female breast: Principal | ICD-10-CM

## 2018-01-04 DIAGNOSIS — R599 Enlarged lymph nodes, unspecified: Secondary | ICD-10-CM

## 2018-01-05 ENCOUNTER — Ambulatory Visit
Admission: RE | Admit: 2018-01-05 | Discharge: 2018-01-05 | Disposition: A | Discharge status: Home / Self Care | Payer: BLUE CROSS/BLUE SHIELD | Source: Ambulatory Visit | Attending: Hematology and Oncology | Admitting: Hematology and Oncology

## 2018-01-05 DIAGNOSIS — R599 Enlarged lymph nodes, unspecified: Secondary | ICD-10-CM

## 2018-01-07 DIAGNOSIS — K641 Second degree hemorrhoids: Secondary | ICD-10-CM | POA: Diagnosis not present

## 2018-01-07 DIAGNOSIS — K5730 Diverticulosis of large intestine without perforation or abscess without bleeding: Secondary | ICD-10-CM | POA: Diagnosis not present

## 2018-01-07 DIAGNOSIS — K625 Hemorrhage of anus and rectum: Secondary | ICD-10-CM | POA: Diagnosis not present

## 2018-01-07 DIAGNOSIS — K5904 Chronic idiopathic constipation: Secondary | ICD-10-CM | POA: Diagnosis not present

## 2018-01-08 ENCOUNTER — Other Ambulatory Visit: Payer: Self-pay

## 2018-01-08 ENCOUNTER — Ambulatory Visit (HOSPITAL_COMMUNITY): Payer: BLUE CROSS/BLUE SHIELD

## 2018-01-08 ENCOUNTER — Encounter (HOSPITAL_COMMUNITY): Payer: BLUE CROSS/BLUE SHIELD

## 2018-01-08 DIAGNOSIS — L259 Unspecified contact dermatitis, unspecified cause: Secondary | ICD-10-CM | POA: Diagnosis not present

## 2018-01-08 DIAGNOSIS — Z6820 Body mass index (BMI) 20.0-20.9, adult: Secondary | ICD-10-CM | POA: Diagnosis not present

## 2018-01-11 ENCOUNTER — Telehealth: Payer: Self-pay | Admitting: Hematology and Oncology

## 2018-01-11 ENCOUNTER — Inpatient Hospital Stay: Payer: BLUE CROSS/BLUE SHIELD | Admitting: Hematology and Oncology

## 2018-01-11 VITALS — BP 151/95 | HR 78 | Temp 97.7°F | Resp 18 | Ht 64.0 in | Wt 117.3 lb

## 2018-01-11 DIAGNOSIS — M549 Dorsalgia, unspecified: Secondary | ICD-10-CM

## 2018-01-11 DIAGNOSIS — C50412 Malignant neoplasm of upper-outer quadrant of left female breast: Principal | ICD-10-CM

## 2018-01-11 DIAGNOSIS — Z9013 Acquired absence of bilateral breasts and nipples: Secondary | ICD-10-CM | POA: Diagnosis not present

## 2018-01-11 DIAGNOSIS — R229 Localized swelling, mass and lump, unspecified: Secondary | ICD-10-CM

## 2018-01-11 DIAGNOSIS — Z79810 Long term (current) use of selective estrogen receptor modulators (SERMs): Secondary | ICD-10-CM

## 2018-01-11 DIAGNOSIS — E0500 Thyrotoxicosis with diffuse goiter without thyrotoxic crisis or storm: Secondary | ICD-10-CM

## 2018-01-11 DIAGNOSIS — C50411 Malignant neoplasm of upper-outer quadrant of right female breast: Secondary | ICD-10-CM

## 2018-01-11 DIAGNOSIS — R5383 Other fatigue: Secondary | ICD-10-CM

## 2018-01-11 DIAGNOSIS — Z79899 Other long term (current) drug therapy: Secondary | ICD-10-CM

## 2018-01-11 DIAGNOSIS — Z170 Estrogen receptor positive status [ER+]: Secondary | ICD-10-CM

## 2018-01-11 DIAGNOSIS — R590 Localized enlarged lymph nodes: Secondary | ICD-10-CM

## 2018-01-11 DIAGNOSIS — Z1231 Encounter for screening mammogram for malignant neoplasm of breast: Secondary | ICD-10-CM

## 2018-01-11 DIAGNOSIS — K921 Melena: Secondary | ICD-10-CM | POA: Diagnosis not present

## 2018-01-11 NOTE — Progress Notes (Signed)
Patient Care Team: Reynold Bowen, MD as PCP - General (Endocrinology) Phineas Real Belinda Block, MD as Consulting Physician (Gynecology) Jovita Kussmaul, MD as Consulting Physician (General Surgery) Nicholas Lose, MD as Consulting Physician (Hematology and Oncology)  DIAGNOSIS:  Encounter Diagnoses  Name Primary?  . Encounter for screening mammogram for malignant neoplasm of breast   . 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, estrogen receptor positive ([place]) Yes    SUMMARY OF ONCOLOGIC HISTORY:   Bilateral breast cancer ([place])   01/02/2014 Mammogram    Right breast abnormalities that were evaluated by ultrasound showing hypoechoic mass right breast 10:00 subareolar, at 9:00 position intramammary lymph node 0.85 cm    01/10/2014 Initial Diagnosis    Breast cancer of upper-outer quadrant of right female breast: Invasive ductal carcinoma with DCIS ER 100%, PR 100%, Ki-67 10%, HER-2 negative ratio 0.9    01/16/2014 Breast MRI    Right breast  1.4 x 1.4 x 1.5 cm inferomedially linear non-mass enhancement extending to the nipple 2 cm (biopsied on 01/10/14 IDC ER/PR 100% Ki-67 10%) : Left breast 6 mm oval Mass, no lymph nodes.    01/20/2014 Procedure    Left breast 6:00 position biopsy: IDC with DCIS ER 100%, PR 45%, Ki-67 5%, HER-2 negative ratio 1.13; clinical T1 B. N0 M0 stage IA    02/06/2014 Procedure    BRCA1 and 2 are negative, Breast Next panel Testing was normal and did not reveal any clearly harmful mutation in these genes. The genes tested were ATM, BARD1, BRCA1, BRCA2, BRIP1, CDH1, CHEK2, MRE11A, MUTYH, NBN, NF1, PALB2, PTEN, RAD50, RAD51C, RAD51D,    04/26/2014 Surgery    Bilateral mastectomies with reconstruction, right breast 1.2 cm, ER 100%, PR 100%, HER-2 negative ratio 0.9, 2 sentinel nodes negative T1 cN0 M0 stage IA, left breast 0.8 cm ER/PR positive HER-2 -3 sentinel nodes negative T1b N0 M0 stage IA    05/26/2014 -  Anti-estrogen oral therapy   Tamoxifen 20 mg daily switched to anastrozole 1 mg daily 11/23/2015 stopped November 2017 and then started back on tamoxifen 08/11/2016     CHIEF COMPLIANT: Follow-up to discuss her symptoms and radiology recommendations  INTERVAL HISTORY: Brandy [name] is a 44-year with above-mentioned history of bilateral breast cancer treated with bilateral mastectomies followed by reconstruction and was off all antiestrogen therapy until recently and restarted tamoxifen recently.  She is here today to discuss the best sequence of imaging studies.  She was informed by radiologist at the breast center that they would prefer her to get a MRI and a PET scan given her symptoms related to breast pain and enlarging lymph nodes as well as bone pain.  She is here today accompanied by her daughter to discuss the treatment plan.  REVIEW OF SYSTEMS:   Constitutional: Denies fevers, chills or abnormal weight loss Eyes: Denies blurriness of vision Ears, nose, mouth, throat, and face: Denies mucositis or sore throat Respiratory: Denies cough, dyspnea or wheezes Cardiovascular: Denies palpitation, chest discomfort Gastrointestinal:  Denies nausea, heartburn or change in bowel habits Skin: Denies abnormal skin rashes Lymphatics: Denies new lymphadenopathy or easy bruising Neurological:Denies numbness, tingling or new weaknesses Behavioral/Psych: Very stressed and anxious Extremities: No lower extremity edema Breast: Bilateral breast reconstruction with enlarged left axillary lymph node All other systems were reviewed with the patient and are negative.  I have reviewed the past medical history, past surgical history, social history and family history with the patient and they are unchanged from  previous note.  ALLERGIES:  is allergic to zofran [ondansetron hcl].  MEDICATIONS:  Current Outpatient Medications  Medication Sig Dispense Refill  . albuterol (PROVENTIL HFA;VENTOLIN HFA) 108 (90 BASE) MCG/ACT inhaler Inhale  2 puffs into the lungs every 6 (six) hours as needed for wheezing or shortness of breath (asthma).    . ALPRAZolam (XANAX) 0.5 MG tablet Take 0.5 mg by mouth 4 (four) times daily as needed for anxiety or sleep. Take 1/2 to 1 tablet daily as needed for acute anxiety/insomnia    . tamoxifen (NOLVADEX) 20 MG tablet Take 1 tablet (20 mg total) by mouth daily. (Patient not taking: Reported on 12/17/2017) 90 tablet 3   No current facility-administered medications for this visit.     PHYSICAL EXAMINATION: ECOG PERFORMANCE STATUS: 1 - Symptomatic but completely ambulatory  Vitals:   01/11/18 1453  BP: (!) 151/95  Pulse: 78  Resp: 18  Temp: 97.7 F (36.5 C)  SpO2: 100%   Filed Weights   01/11/18 1453  Weight: 117 lb 4.8 oz (53.2 kg)    GENERAL:alert, no distress and comfortable SKIN: skin color, texture, turgor are normal, no rashes or significant lesions EYES: normal, Conjunctiva are pink and non-injected, sclera clear OROPHARYNX:no exudate, no erythema and lips, buccal mucosa, and tongue normal  NECK: supple, thyroid normal size, non-tender, without nodularity LYMPH:  no palpable lymphadenopathy in the cervical, axillary or inguinal LUNGS: clear to auscultation and percussion with normal breathing effort HEART: regular rate & rhythm and no murmurs and no lower extremity edema ABDOMEN:abdomen soft, non-tender and normal bowel sounds MUSCULOSKELETAL:no cyanosis of digits and no clubbing  NEURO: alert & oriented x 3 with fluent speech, no focal motor/sensory deficits EXTREMITIES: No lower extremity edema   LABORATORY DATA:  I have reviewed the data as listed CMP Latest Ref Rng & Units 12/28/2017 08/14/2017 06/27/2017  Glucose 70 - 99 mg/dL 88 70 127(H)  BUN 6 - 20 mg/dL _0 Creatinine 0.44 - 1.00 mg/dL 0.80 0.93 0.54  Sodium 135 - 145 mmol/L 143 139 138  Potassium 3.5 - 5.1 mmol/L 3.7 3.7 3.6  Chloride 98 - 111 mmol/L 105 104 106  CO2 22 - 32 mmol/L _1 Calcium 8.9 - 10.3  mg/dL 9.6 9.7 9.4  Total Protein 6.5 - 8.1 g/dL 7.5 7.0 7.1  Total Bilirubin 0.3 - 1.2 mg/dL 0.5 0.7 0.5  Alkaline Phos 38 - 126 U/L 74 - 70  AST 15 - 41 U/L 23 26 61(H)  ALT 0 - 44 U/L 30 32(H) 51    Lab Results  Component Value Date   WBC 4.5 12/28/2017   HGB 14.0 12/28/2017   HCT 41.5 12/28/2017   MCV 91.4 12/28/2017   PLT 231 12/28/2017   NEUTROABS 3.0 12/28/2017    ASSESSMENT & PLAN:  Bilateral breast cancer (HCC) 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%, PR 100%, HER-2 negative Pathological stage: T1 cN0 M0 ER 100%, PR 100%, HER-2 negative ratio 0.9, margins negative, 2 SLN negative  Left breast:Clinical T1 BN 0 M0 invasive ductal carcinoma ER positive PR 45%, HER-2 negative Pathologic stage: T1b N0 M0 ER 100% PR 45% HER-2 negative ratio 1.1 margins clear, 3 sentinel nodes negative Oncotype DX recurrence score 16, 10% risk of recurrence  Current treatment:Tamoxifen 20 mg daily started January 2016  switched to anastrozoleJuly 2017 stopped November 2017 because of hot flashes, could not tolerate tamoxifen even at 10 mg dose and she discontinued it; resumed  it 01/08/2018 at 10 mg daily  Multiple complaints 1.  Back pain: We will obtain a PET CT scan 2. left axillary nodules: We will obtain a breast MRI 3.  Blood in the stool: I sent a message to Dr. Collene Mares 4.  Swelling around the eyes: Thyroid function tests were normal  We will obtain a PET CT scan and a breast MRI and follow-up after that to discuss the results in 2 weeks.  Return to clinic in 2 weeks for follow-up to discuss the results of the scans.    Orders Placed This Encounter  Procedures  . NM PET Image Restag (PS) Skull Base To Thigh    Standing Status:   Future    Standing Expiration Date:   01/11/2019    Order Specific Question:   ** REASON FOR EXAM (FREE TEXT)    Answer:   Clemmie Krill with H/O breast cancer    Order Specific Question:   If indicated for the ordered procedure,  I authorize the administration of a radiopharmaceutical per Radiology protocol    Answer:   Yes    Order Specific Question:   Is the patient pregnant?    Answer:   No    Order Specific Question:   Preferred imaging location?    Answer:   [facility]    Order Specific Question:   Radiology Contrast Protocol - do NOT remove file path    Answer:   \\[email or web address]  . MR BREAST BILATERAL W [place] CAD    Standing Status:   Future    Standing Expiration Date:   03/14/2019    Order Specific Question:   ** REASON FOR EXAM (FREE TEXT)    Answer:   Breast implants causing pain    Order Specific Question:   If indicated for the ordered procedure, I authorize the administration of contrast media per Radiology protocol    Answer:   Yes    Order Specific Question:   What is the patient's sedation requirement?    Answer:   No Sedation    Order Specific Question:   Does the patient have a pacemaker or implanted devices?    Answer:   Yes    Comments:   breast implants    Order Specific Question:   Radiology Contrast Protocol - do NOT remove file path    Answer:   \\[email or web address]    Order Specific Question:   Preferred imaging location?    Answer:   [facility] (table limit-550lbs)   The patient has a good understanding of the overall plan. she agrees with it. she will call with any problems that may develop before the next visit here.   Harriette Ohara, MD 01/11/18

## 2018-01-11 NOTE — Assessment & Plan Note (Signed)
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%, PR 100%, HER-2 negative Pathological stage: T1 cN0 M0 ER 100%, PR 100%, HER-2 negative ratio 0.9, margins negative, 2 SLN negative  Left breast:Clinical T1 BN 0 M0 invasive ductal carcinoma ER positive PR 45%, HER-2 negative Pathologic stage: T1b N0 M0 ER 100% PR 45% HER-2 negative ratio 1.1 margins clear, 3 sentinel nodes negative Oncotype DX recurrence score 16, 10% risk of recurrence  Current treatment:Tamoxifen 20 mg daily started January 2016  switched to anastrozoleJuly 2017 stopped November 2017 because of hot flashes, could not tolerate tamoxifen even at 10 mg dose and she discontinued it; resumed it 01/08/2018 at 10 mg daily  Multiple complaints 1.  Back pain: We will obtain a PET CT scan 2. left axillary nodules: We will obtain a breast MRI 3.  Blood in the stool: I sent a message to Dr. Collene Mares 4.  Swelling around the eyes: Thyroid function tests were normal  We will obtain a PET CT scan and a breast MRI and follow-up after that to discuss the results in 2 weeks.  Return to clinic in 2 weeks for follow-up to discuss the results of the scans.

## 2018-01-11 NOTE — Telephone Encounter (Signed)
Gave avs and calendar ° °

## 2018-01-12 ENCOUNTER — Telehealth: Payer: Self-pay | Admitting: Adult Health

## 2018-01-12 NOTE — Telephone Encounter (Signed)
Called [facility].  Spoke with Fraser Din for two minutes, followed by Ruby for 5 minutes, and then [employer] for 5 additional minutes.  Repeated patient info and situation to each of these people.  Then was transferred to Dr. Radford Pax to discuss patient case.  PET scan was approved through 07/14/18 (though she said not to trust the date and typically it is only 30 day authorization), with authorization number of [id number].    Time spent: 18: [place], NP

## 2018-01-13 ENCOUNTER — Ambulatory Visit: Payer: BLUE CROSS/BLUE SHIELD | Admitting: Gynecology

## 2018-01-13 ENCOUNTER — Encounter: Payer: Self-pay | Admitting: Gynecology

## 2018-01-13 ENCOUNTER — Ambulatory Visit (INDEPENDENT_AMBULATORY_CARE_PROVIDER_SITE_OTHER): Payer: BLUE CROSS/BLUE SHIELD

## 2018-01-13 VITALS — BP 118/76

## 2018-01-13 DIAGNOSIS — Z853 Personal history of malignant neoplasm of breast: Secondary | ICD-10-CM

## 2018-01-13 DIAGNOSIS — R102 Pelvic and perineal pain: Secondary | ICD-10-CM

## 2018-01-13 DIAGNOSIS — R1030 Lower abdominal pain, unspecified: Secondary | ICD-10-CM | POA: Diagnosis not present

## 2018-01-13 NOTE — Progress Notes (Addendum)
    Brandy [name] 1961/04/15 [id number]        57 y.o.  G3P3003 presents for ultrasound.  History of lower abdominal pain this been evaluated to include CT scan.  She was concerned about her ovaries with her history of breast cancer and we ordered the ultrasound for ovarian surveillance.  Past medical history,surgical history, problem list, medications, allergies, family history and social history were all reviewed and documented in the EPIC chart.  Directed ROS with pertinent positives and negatives documented in the history of present illness/assessment and plan.  Exam: Vitals:   01/13/18 1622  BP: 118/76   General appearance:  Normal  Ultrasound transvaginal shows uterus normal size and echotexture.  Small myoma noted at 20 mm.  Endometrial echo 3.3 mm.  Right and left ovaries visualized and normal.  Cul-de-sac negative.  Assessment/Plan:  57 y.o. G3P3003 with normal GYN ultrasound.  Small myoma noted and discussed.  Ovaries clearly visualized and normal.  Patient reassured and will follow up with her other physicians as needed.  Has restarted tamoxifen and we discussed that I anticipate the endometrial echo will thicken but no need to do follow-up scans at this point unless patient has bleeding or other issues.    [name] MD, 4:38 PM 01/13/2018

## 2018-01-13 NOTE — Patient Instructions (Signed)
Follow-up for annual exam this coming March when due.

## 2018-01-15 ENCOUNTER — Encounter (HOSPITAL_COMMUNITY): Payer: BLUE CROSS/BLUE SHIELD

## 2018-01-19 ENCOUNTER — Encounter (HOSPITAL_COMMUNITY)
Admission: RE | Admit: 2018-01-19 | Discharge: 2018-01-19 | Disposition: A | Discharge status: Home / Self Care | Payer: BLUE CROSS/BLUE SHIELD | Source: Ambulatory Visit | Attending: Hematology and Oncology | Admitting: Hematology and Oncology

## 2018-01-19 DIAGNOSIS — Z170 Estrogen receptor positive status [ER+]: Secondary | ICD-10-CM | POA: Insufficient documentation

## 2018-01-19 DIAGNOSIS — C50411 Malignant neoplasm of upper-outer quadrant of right female breast: Secondary | ICD-10-CM

## 2018-01-19 DIAGNOSIS — C50412 Malignant neoplasm of upper-outer quadrant of left female breast: Secondary | ICD-10-CM | POA: Insufficient documentation

## 2018-01-19 DIAGNOSIS — C50919 Malignant neoplasm of unspecified site of unspecified female breast: Secondary | ICD-10-CM | POA: Diagnosis not present

## 2018-01-19 LAB — GLUCOSE, CAPILLARY: Glucose-Capillary: 84 mg/dL (ref 70–99)

## 2018-01-19 MED ORDER — FLUDEOXYGLUCOSE F - 18 (FDG) INJECTION
5.8400 | Freq: Once | INTRAVENOUS | Status: AC | PRN
  Administered 2018-01-19: 5.84 via INTRAVENOUS

## 2018-01-21 ENCOUNTER — Encounter: Payer: Self-pay | Admitting: Hematology and Oncology

## 2018-01-23 ENCOUNTER — Ambulatory Visit
Admission: RE | Admit: 2018-01-23 | Discharge: 2018-01-23 | Disposition: A | Discharge status: Home / Self Care | Payer: BLUE CROSS/BLUE SHIELD | Source: Ambulatory Visit | Attending: Hematology and Oncology | Admitting: Hematology and Oncology

## 2018-01-23 DIAGNOSIS — Z1231 Encounter for screening mammogram for malignant neoplasm of breast: Secondary | ICD-10-CM

## 2018-01-23 DIAGNOSIS — Z853 Personal history of malignant neoplasm of breast: Secondary | ICD-10-CM | POA: Diagnosis not present

## 2018-01-23 MED ORDER — GADOBENATE DIMEGLUMINE 529 MG/ML IV SOLN
10.0000 mL | Freq: Once | INTRAVENOUS | Status: AC | PRN
  Administered 2018-01-23: 10 mL via INTRAVENOUS

## 2018-01-25 ENCOUNTER — Inpatient Hospital Stay: Payer: BLUE CROSS/BLUE SHIELD | Attending: Hematology and Oncology | Admitting: Hematology and Oncology

## 2018-01-25 ENCOUNTER — Telehealth: Payer: Self-pay | Admitting: Hematology and Oncology

## 2018-01-25 VITALS — BP 145/86 | HR 77 | Temp 97.6°F | Resp 20 | Ht 64.0 in | Wt 114.5 lb

## 2018-01-25 DIAGNOSIS — Z79810 Long term (current) use of selective estrogen receptor modulators (SERMs): Secondary | ICD-10-CM

## 2018-01-25 DIAGNOSIS — K921 Melena: Secondary | ICD-10-CM | POA: Diagnosis not present

## 2018-01-25 DIAGNOSIS — Z79811 Long term (current) use of aromatase inhibitors: Secondary | ICD-10-CM | POA: Insufficient documentation

## 2018-01-25 DIAGNOSIS — C50011 Malignant neoplasm of nipple and areola, right female breast: Secondary | ICD-10-CM | POA: Diagnosis not present

## 2018-01-25 DIAGNOSIS — Z170 Estrogen receptor positive status [ER+]: Secondary | ICD-10-CM

## 2018-01-25 DIAGNOSIS — C50411 Malignant neoplasm of upper-outer quadrant of right female breast: Secondary | ICD-10-CM | POA: Diagnosis not present

## 2018-01-25 DIAGNOSIS — M549 Dorsalgia, unspecified: Secondary | ICD-10-CM | POA: Insufficient documentation

## 2018-01-25 DIAGNOSIS — Z79899 Other long term (current) drug therapy: Secondary | ICD-10-CM | POA: Insufficient documentation

## 2018-01-25 DIAGNOSIS — C50012 Malignant neoplasm of nipple and areola, left female breast: Secondary | ICD-10-CM

## 2018-01-25 DIAGNOSIS — F418 Other specified anxiety disorders: Secondary | ICD-10-CM | POA: Diagnosis not present

## 2018-01-25 DIAGNOSIS — Z9013 Acquired absence of bilateral breasts and nipples: Secondary | ICD-10-CM | POA: Diagnosis not present

## 2018-01-25 MED ORDER — LETROZOLE 2.5 MG PO TABS: 2.5000 mg | ORAL_TABLET | Freq: Every day | ORAL | 0 refills | Status: DC

## 2018-01-25 NOTE — Assessment & Plan Note (Signed)
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%, PR 100%, HER-2 negative Pathological stage: T1 cN0 M0 ER 100%, PR 100%, HER-2 negative ratio 0.9, margins negative, 2 SLN negative  Left breast:Clinical T1 BN 0 M0 invasive ductal carcinoma ER positive PR 45%, HER-2 negative Pathologic stage: T1b N0 M0 ER 100% PR 45% HER-2 negative ratio 1.1 margins clear, 3 sentinel nodes negative Oncotype DX recurrence score 16, 10% risk of recurrence  Current treatment:Tamoxifen 20 mg daily started January 2016switched to anastrozoleJuly 2017stopped November 2017 because of hot flashes, could not tolerate tamoxifen even at 10 mg dose and she discontinued it; resumed it 01/08/2018 at 10 mg daily  Multiple complaints 1.  Back pain: We will obtain a PET CT scan 2. left axillary nodules: We will obtain a breast MRI 3.  Blood in the stool: I sent a message to Dr. Mann 4.  Swelling around the eyes: Thyroid function tests were normal 

## 2018-01-25 NOTE — Telephone Encounter (Signed)
Gave avs and calendar ° °

## 2018-01-25 NOTE — Progress Notes (Signed)
Patient Care Team: Reynold Bowen, MD as PCP - General (Endocrinology) Phineas Real Belinda Block, MD as Consulting Physician (Gynecology) Jovita Kussmaul, MD as Consulting Physician (General Surgery) Nicholas Lose, MD as Consulting Physician (Hematology and Oncology)  DIAGNOSIS:  Encounter Diagnosis  Name Primary?  . Bilateral malignant neoplasm involving both nipple and areola in female, unspecified estrogen receptor status ([place]) Yes    SUMMARY OF ONCOLOGIC HISTORY:   Bilateral breast cancer ([place])   01/02/2014 Mammogram    Right breast abnormalities that were evaluated by ultrasound showing hypoechoic mass right breast 10:00 subareolar, at 9:00 position intramammary lymph node 0.85 cm    01/10/2014 Initial Diagnosis    Breast cancer of upper-outer quadrant of right female breast: Invasive ductal carcinoma with DCIS ER 100%, PR 100%, Ki-67 10%, HER-2 negative ratio 0.9    01/16/2014 Breast MRI    Right breast  1.4 x 1.4 x 1.5 cm inferomedially linear non-mass enhancement extending to the nipple 2 cm (biopsied on 01/10/14 IDC ER/PR 100% Ki-67 10%) : Left breast 6 mm oval Mass, no lymph nodes.    01/20/2014 Procedure    Left breast 6:00 position biopsy: IDC with DCIS ER 100%, PR 45%, Ki-67 5%, HER-2 negative ratio 1.13; clinical T1 B. N0 M0 stage IA    02/06/2014 Procedure    BRCA1 and 2 are negative, Breast Next panel Testing was normal and did not reveal any clearly harmful mutation in these genes. The genes tested were ATM, BARD1, BRCA1, BRCA2, BRIP1, CDH1, CHEK2, MRE11A, MUTYH, NBN, NF1, PALB2, PTEN, RAD50, RAD51C, RAD51D,    04/26/2014 Surgery    Bilateral mastectomies with reconstruction, right breast 1.2 cm, ER 100%, PR 100%, HER-2 negative ratio 0.9, 2 sentinel nodes negative T1 cN0 M0 stage IA, left breast 0.8 cm ER/PR positive HER-2 -3 sentinel nodes negative T1b N0 M0 stage IA    05/26/2014 -  Anti-estrogen oral therapy    Tamoxifen 20 mg daily switched to anastrozole 1 mg daily  11/23/2015 stopped November 2017 and then started back on tamoxifen 08/11/2016     CHIEF COMPLIANT: Follow-up to discuss a PET CT scan and MRI breast  INTERVAL HISTORY: Brandy [name] is a 57 year old with above-mentioned history of bilateral mastectomies who is currently on surveillance.  She is started back on tamoxifen therapy.  She felt left axillary nods and wanted to get a further evaluation.  We performed a breast MRI and a whole-body PET CT scan.  PET CT scan was performed because she had diffuse bone pain.  None of these tests appear to show any clear-cut etiology.  The PET/CT was negative for metastatic disease.  The MRI of the breast showed stable appearing left axillary lymph nodes which are unchanged and do not need to be biopsied.  REVIEW OF SYSTEMS:   Constitutional: Denies fevers, chills or abnormal weight loss Eyes: Denies blurriness of vision Ears, nose, mouth, throat, and face: Swelling over the eyelids Respiratory: Denies cough, dyspnea or wheezes Cardiovascular: Denies palpitation, chest discomfort Gastrointestinal:  Denies nausea, heartburn or change in bowel habits Skin: Facial rash of unclear etiology Lymphatics: Denies new lymphadenopathy or easy bruising Neurological:Denies numbness, tingling or new weaknesses Behavioral/Psych: Very anxious and nervous Extremities: No lower extremity edema  All other systems were reviewed with the patient and are negative.  I have reviewed the past medical history, past surgical history, social history and family history with the patient and they are unchanged from previous note.  ALLERGIES:  is allergic to zofran Alvis Lemmings  hcl].  MEDICATIONS:  Current Outpatient Medications  Medication Sig Dispense Refill  . albuterol (PROVENTIL HFA;VENTOLIN HFA) 108 (90 BASE) MCG/ACT inhaler Inhale 2 puffs into the lungs every 6 (six) hours as needed for wheezing or shortness of breath (asthma).    . ALPRAZolam (XANAX) 0.5 MG tablet Take  0.5 mg by mouth 4 (four) times daily as needed for anxiety or sleep. Take 1/2 to 1 tablet daily as needed for acute anxiety/insomnia    . letrozole (FEMARA) 2.5 MG tablet Take 1 tablet (2.5 mg total) by mouth daily. 30 tablet 0  . tamoxifen (NOLVADEX) 20 MG tablet Take 1 tablet (20 mg total) by mouth daily. 90 tablet 3   No current facility-administered medications for this visit.     PHYSICAL EXAMINATION: ECOG PERFORMANCE STATUS: 1 - Symptomatic but completely ambulatory  Vitals:   01/25/18 0927  BP: (!) 145/86  Pulse: 77  Resp: 20  Temp: 97.6 F (36.4 C)  SpO2: 100%   Filed Weights   01/25/18 0927  Weight: 114 lb 8 oz (51.9 kg)    GENERAL:alert, no distress and comfortable SKIN: skin color, texture, turgor are normal, no rashes or significant lesions EYES: normal, Conjunctiva are pink and non-injected, sclera clear OROPHARYNX:no exudate, no erythema and lips, buccal mucosa, and tongue normal  NECK: supple, thyroid normal size, non-tender, without nodularity LYMPH:  no palpable lymphadenopathy in the cervical, axillary or inguinal LUNGS: clear to auscultation and percussion with normal breathing effort HEART: regular rate & rhythm and no murmurs and no lower extremity edema ABDOMEN:abdomen soft, non-tender and normal bowel sounds MUSCULOSKELETAL:no cyanosis of digits and no clubbing  NEURO: alert & oriented x 3 with fluent speech, no focal motor/sensory deficits EXTREMITIES: No lower extremity edema BREAST: Very small palpable nodules on the left axillary chest wall. (exam performed in the presence of a chaperone)  LABORATORY DATA:  I have reviewed the data as listed CMP Latest Ref Rng & Units 12/28/2017 08/14/2017 06/27/2017  Glucose 70 - 99 mg/dL 88 70 127(H)  BUN 6 - 20 mg/dL '13 12 11  ' Creatinine 0.44 - 1.00 mg/dL 0.80 0.93 0.54  Sodium 135 - 145 mmol/L 143 139 138  Potassium 3.5 - 5.1 mmol/L 3.7 3.7 3.6  Chloride 98 - 111 mmol/L 105 104 106  CO2 22 - 32 mmol/L '28 30 25   ' Calcium 8.9 - 10.3 mg/dL 9.6 9.7 9.4  Total Protein 6.5 - 8.1 g/dL 7.5 7.0 7.1  Total Bilirubin 0.3 - 1.2 mg/dL 0.5 0.7 0.5  Alkaline Phos 38 - 126 U/L 74 - 70  AST 15 - 41 U/L 23 26 61(H)  ALT 0 - 44 U/L 30 32(H) 51    Lab Results  Component Value Date   WBC 4.5 12/28/2017   HGB 14.0 12/28/2017   HCT 41.5 12/28/2017   MCV 91.4 12/28/2017   PLT 231 12/28/2017   NEUTROABS 3.0 12/28/2017    ASSESSMENT & PLAN:  Bilateral breast cancer ([facility]) 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%, PR 100%, HER-2 negative Pathological stage: T1 cN0 M0 ER 100%, PR 100%, HER-2 negative ratio 0.9, margins negative, 2 SLN negative  Left breast:Clinical T1 BN 0 M0 invasive ductal carcinoma ER positive PR 45%, HER-2 negative Pathologic stage: T1b N0 M0 ER 100% PR 45% HER-2 negative ratio 1.1 margins clear, 3 sentinel nodes negative Oncotype DX recurrence score 16, 10% risk of recurrence  Current treatment:Tamoxifen 20 mg daily started January 2016switched to anastrozoleJuly 2017stopped November 2017  because of hot flashes, could not tolerate tamoxifen even at 10 mg dose and she discontinued it; resumed it 01/08/2018 at 10 mg daily  Multiple complaints 1.  Back pain: PET CT scan 2. left axillary nodules:  Breast MRI: Revealed stable appearing left axillary lymph nodes that are very small and could be monitored. 3.  Blood in the stool: I sees Dr. Collene Mares 4.  Swelling around the eyes: Thyroid function tests were normal I instructed the patient to follow with her primary care physician regarding her complaints of skin rash as well as the swelling around the eyelids.  There is no concern for metastatic breast cancer at this time.  No orders of the defined types were placed in this encounter.  The patient has a good understanding of the overall plan. she agrees with it. she will call with any problems that may develop before the next visit here.   Harriette Ohara,  MD 01/25/18

## 2018-01-26 ENCOUNTER — Ambulatory Visit (HOSPITAL_COMMUNITY): Payer: BLUE CROSS/BLUE SHIELD

## 2018-01-27 DIAGNOSIS — K219 Gastro-esophageal reflux disease without esophagitis: Secondary | ICD-10-CM | POA: Diagnosis not present

## 2018-01-27 DIAGNOSIS — Z853 Personal history of malignant neoplasm of breast: Secondary | ICD-10-CM | POA: Diagnosis not present

## 2018-02-02 ENCOUNTER — Ambulatory Visit: Payer: BLUE CROSS/BLUE SHIELD | Admitting: Allergy and Immunology

## 2018-02-02 ENCOUNTER — Encounter: Payer: Self-pay | Admitting: Allergy and Immunology

## 2018-02-02 VITALS — BP 130/80 | HR 88 | Temp 97.5°F | Resp 20 | Ht 62.7 in | Wt 116.4 lb

## 2018-02-02 DIAGNOSIS — G43909 Migraine, unspecified, not intractable, without status migrainosus: Secondary | ICD-10-CM

## 2018-02-02 DIAGNOSIS — J3089 Other allergic rhinitis: Secondary | ICD-10-CM | POA: Diagnosis not present

## 2018-02-02 DIAGNOSIS — J4540 Moderate persistent asthma, uncomplicated: Secondary | ICD-10-CM | POA: Diagnosis not present

## 2018-02-02 DIAGNOSIS — G4720 Circadian rhythm sleep disorder, unspecified type: Secondary | ICD-10-CM | POA: Diagnosis not present

## 2018-02-02 MED ORDER — FLUTICASONE FUROATE-VILANTEROL 200-25 MCG/INH IN AEPB: 1.0000 | INHALATION_SPRAY | Freq: Every day | RESPIRATORY_TRACT | 1 refills | Status: DC

## 2018-02-02 MED ORDER — CYPROHEPTADINE HCL 4 MG PO TABS: 4.0000 mg | ORAL_TABLET | Freq: Three times a day (TID) | ORAL | 0 refills | Status: DC | PRN

## 2018-02-02 NOTE — Progress Notes (Signed)
Dear Dr. Forde Dandy,  Thank you for referring Brandy [name] to the [facility] on 02/02/2018.   Below is a summation of this patient's evaluation and recommendations.  Thank you for your referral. I will keep you informed about this patient's response to treatment.   If you have any questions please do not hesitate to contact me.   Sincerely,  Jiles Prows, MD Allergy / Immunology [place] [facility]   ______________________________________________________________________    NEW PATIENT NOTE  Referring Provider: Reynold Bowen, MD Primary Provider: Reynold Bowen, MD Date of office visit: 02/02/2018    Subjective:   Chief Complaint:  Brandy [name] (DOB: 06/12/1960) is a 57 y.o. female who presents to the clinic on 02/02/2018 with a chief complaint of Shortness of Breath and Sore Throat (all the time) .     HPI: [name] presents to this clinic in evaluation of several issues.  First, she has a very distant history of being diagnosed with asthma which has been relatively inactive for many years until May 2019 at which point in time she developed a episode of wheezing and coughing along with issues involving her upper airway including congestion and feeling bad in general with achiness and fatigue but no fever.  She was treated with prednisone and Singulair and nasal steroids and antibiotics and did relatively well.  Her upper airway symptoms abated but she has continued to have problems with intermittent chest tightness.  When she uses a short acting bronchodilator she does have relief of the chest tightness transiently.  She has also noticed that since that point in time that her exercise capacity has decreased significantly.  She lives on a large plot of land and has a driveway that is one mild long and she can no longer walk that driveway.  Second, she has this "throat thing".  She states that she  feels as though there is something stuck in her throat and she has some throat clearing and some intermittent raspy voice.  She does not have any symptoms to suggest reflux.  She does have a history of having reflux is in the distant past but has not had any symptoms suggesting active reflux or requiring any treatment in many years.  Apparently she has had evaluation with an ENT doctor recently who identified no significant abnormality of her [place].    Third, she has a "behind the eyes" thing.  This is a pressure phenomena that can sometimes be associated with a throbbing quality and occasionally with floaters and leaving her nauseated and dizzy.  This appears to occur 1 or 2 times per week and can last up to a day.  She has been taking Advil to treat this issue and she is now using Advil on almost a daily basis.  Fourth, she has very disrupted sleep.  She has trouble initiating sleep and maintaining sleep for many years.  She has used Ambien in the past but she does not like to use this medication.  Fifth, she has noticed an episode of eye swelling about 1 month ago in association with a "rash" on her cheeks and arms.  This required prednisone and has abated although she still feels as though her eyes are little bit puffy.  Specifically, it is the eyelid and around the eyelid that is a little bit puffy.  Past Medical History:  Diagnosis Date  . Asthma    adult  onset  . Breast cancer ([facility]) 2015   bilateral, status post bilateral mastectomy BRCA testing negative  . High cholesterol   . Hyperthyroidism    last check it was normal 3 months ago  . 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 PONV (postoperative nausea and vomiting)   . Recurrent upper respiratory infection (URI)   . Urticaria   . 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 AUGMENTATION MAMMAPLASTY Bilateral    11/2014  . BILATERAL TOTAL MASTECTOMY WITH  AXILLARY LYMPH NODE DISSECTION  2015  . BREAST SURGERY     Implants post bilateral mastectomy  . CARDIAC CATHETERIZATION  2008  . COLONOSCOPY  10/2013   polyps were normal  . DILATATION & CURETTAGE/HYSTEROSCOPY WITH MYOSURE N/A 11/09/2014   Procedure: DILATATION & CURETTAGE/HYSTEROSCOPY WITH MYOSURE;  Surgeon: [name], MD;  Location: [facility];  Service: Gynecology;  Laterality: N/A;  1:00pm OR time requested.  one hour needed  does not need Myosure rep available.  Marland Kitchen MASTECTOMY Bilateral    04/2014    Allergies as of 02/02/2018      Reactions   Zofran [ondansetron Hcl] Other (See Comments)   Dizziness and ineffective       Medication List      albuterol 108 (90 Base) MCG/ACT inhaler Commonly known as:  PROVENTIL HFA;VENTOLIN HFA Inhale 2 puffs into the lungs every 6 (six) hours as needed for wheezing or shortness of breath (asthma).   ALPRAZolam 0.5 MG tablet Commonly known as:  XANAX Take 0.5 mg by mouth 4 (four) times daily as needed for anxiety or sleep. Take 1/2 to 1 tablet daily as needed for acute anxiety/insomnia   letrozole 2.5 MG tablet Commonly known as:  FEMARA Take 1 tablet (2.5 mg total) by mouth daily.   tamoxifen 20 MG tablet Commonly known as:  NOLVADEX Take 1 tablet (20 mg total) by mouth daily.   Vitamin D (Ergocalciferol) 50000 units Caps capsule Commonly known as:  DRISDOL Take 50,000 Units by mouth every 7 (seven) days.       Review of systems negative except as noted in HPI / PMHx or noted below:  Review of Systems  Constitutional: Negative.   HENT: Negative.   Eyes: Negative.   Respiratory: Negative.   Cardiovascular: Negative.   Gastrointestinal: Negative.   Genitourinary: Negative.   Musculoskeletal: Negative.   Skin: Negative.   Neurological: Negative.   Endo/Heme/Allergies: Negative.   Psychiatric/Behavioral: Negative.     Family History  Problem Relation Age of Onset  . Heart disease Father 13  . Hypertension Father    . Diabetes Father   . Diabetes Mother   . Allergic rhinitis Mother   . Asthma Mother   . Hypertension Sister   . Diabetes Sister   . Allergic rhinitis Sister   . Asthma Sister   . Hypertension Brother   . Diabetes Brother   . Stroke Brother   . Breast cancer Maternal Aunt        40's  . Breast cancer Maternal Grandmother 80  . Cancer Maternal Grandfather        colon  . Heart disease Paternal Grandmother   . Diabetes Paternal Grandmother   . Heart disease Paternal Grandfather   . Diabetes Paternal Grandfather   . Breast cancer Maternal Aunt        40's  . Cancer Maternal Aunt        brain; ?  primary; deceased 45  . Allergic rhinitis Sister   . Asthma Sister   . Allergic rhinitis Son   . Asthma Son   . Eczema Neg Hx   . Urticaria Neg Hx     Social History   Socioeconomic History  . Marital status: Married    Spouse name: Not on file  . Number of children: 3  . Years of education: Not on file  . Highest education level: Not on file  Occupational History    Employer: [employer]    Comment: [employer]  Social Needs  . Financial resource strain: Not on file  . Food insecurity:    Worry: Not on file    Inability: Not on file  . Transportation needs:    Medical: Not on file    Non-medical: Not on file  Tobacco Use  . Smoking status: Never Smoker  . Smokeless tobacco: Never Used  Substance and Sexual Activity  . Alcohol use: No    Alcohol/week: 0.0 standard drinks  . Drug use: No  . Sexual activity: Yes    Comment: intercourse age 30, sexual partners less than 5  Lifestyle  . Physical activity:    Days per week: Not on file    Minutes per session: Not on file  . Stress: Not on file  Relationships  . Social connections:    Talks on phone: Not on file    Gets together: Not on file    Attends religious service: Not on file    Active member of club or organization: Not on file    Attends meetings of clubs or organizations: Not on file    Relationship  status: Not on file  . Intimate partner violence:    Fear of current or ex partner: Not on file    Emotionally abused: Not on file    Physically abused: Not on file    Forced sexual activity: Not on file  Other Topics Concern  . Not on file  Social History Narrative  . Not on file    Environmental and Social history  Lives in a house with a dry environment, a dog located inside the household, carpet in the bedroom, plastic on the bed, plastic on the pillow, no smokers located inside the household.  Objective:   Vitals:   02/02/18 1337  BP: 130/80  Pulse: 88  Resp: 20  Temp: (!) 97.5 F (36.4 C)  SpO2: 97%   Height: 5' 2.7" (159.3 cm) Weight: 116 lb 6.4 oz (52.8 kg)  Physical Exam  HENT:  Head: Normocephalic. Head is without right periorbital erythema and without left periorbital erythema.  Right Ear: Tympanic membrane, external ear and ear canal normal.  Left Ear: Tympanic membrane, external ear and ear canal normal.  Nose: Nose normal. No mucosal edema or rhinorrhea.  Mouth/Throat: Oropharynx is clear and moist and mucous membranes are normal. No oropharyngeal exudate.  Eyes: Pupils are equal, round, and reactive to light. Conjunctivae and lids are normal.  Neck: Trachea normal. No tracheal deviation present. No thyromegaly present.  Cardiovascular: Normal rate, regular rhythm, S1 normal, S2 normal and normal heart sounds.  No murmur heard. Pulmonary/Chest: Effort normal. No stridor. No respiratory distress. She has no wheezes. She has no rales. She exhibits no tenderness.  Abdominal: Soft. She exhibits no distension and no mass. There is no hepatosplenomegaly. There is no tenderness. There is no rebound and no guarding.  Musculoskeletal: She exhibits no edema or tenderness.  Lymphadenopathy:  Head (right side): No tonsillar adenopathy present.       Head (left side): No tonsillar adenopathy present.    She has no cervical adenopathy.    She has no axillary  adenopathy.  Neurological: She is alert.  Skin: No rash noted. She is not diaphoretic. No erythema. No pallor. Nails show no clubbing.    Diagnostics: Allergy skin tests were performed.  She demonstrated hypersensitivity to house dust mite.  Spirometry was performed and demonstrated an FEV1 of 1.50 @ 74 % of predicted. FEV1/FVC = 0.80.  Following the administration of nebulized albuterol her FEV1 rose to 2.32 which was an increase in the FEV1 of 22%.  Results of a PET scan obtained 19 January 2018 identified the following:  NECK: Mildly asymmetric activity along the left palatine tonsil compared to the right with maximum SUV 5.3 on the left and 3.3 on the right.  Incidental CT findings: none  CHEST: No significant abnormal hypermetabolic activity in this region.  Incidental CT findings: Bilateral breast implants have been placed. Bilateral axillary clips. Mild biapical scarring.  ABDOMEN/PELVIS: No significant abnormal hypermetabolic activity in this region.  Incidental CT findings: Mild vascular calcifications along the abdominal aorta and right renal hilum.  SKELETON: No significant abnormal hypermetabolic activity in this region.  Results of blood tests obtained 28 December 2017 identified WBC 4.5, absolute eosinophils 0, absolute lymphocyte 1200, hemoglobin 14.0, platelet 231.  Assessment and Plan:    1. Not well controlled moderate persistent asthma   2. Perennial allergic rhinitis   3. Migraine syndrome   4. Sleep stage or arousal from sleep dysfunction     1.  Allergen avoidance measures  2.  Treat and prevent inflammation:   A.  Breo 200 -1 inhalation 1 time per day  B.  OTC Nasacort -1 spray one time per day  3.  Treat and prevent headaches and sleep dysfunction:   A.  Cyproheptadine 4 mg tablet at bedtime  B.  Slowly taper off all forms of caffeine  4.  If needed:   A.  Proventil or similar 2 inhalations every 4-6 hours  5.  Return to clinic  in 3 weeks or earlier if problem  Arieon appears to have significant inflammation of her airway and we will get her to perform allergen avoidance measures and utilize anti-inflammatory agents for both her upper and lower airway.  As well, she has issues with headaches and sleep dysfunction and I have started her on cyproheptadine at bedtime and asked her to slowly taper off all forms of caffeine.  I will see her back in this clinic in 3 weeks to assess her response to treatment and consider further evaluation and treatment based upon this response.  We are not addressing "this throat thing" which may be a form of LPR at this point and if should she continue to have issues with headaches in the face that this therapy then she may require a sinus CT scan to rule out a component of chronic sinusitis although I would have expected her PET scan to identify the presence of chronic sinusitis when it was performed on 19 January 2018.  Jiles Prows, MD Allergy / Immunology [facility]

## 2018-02-02 NOTE — Patient Instructions (Addendum)
  1.  Allergen avoidance measures  2.  Treat and prevent inflammation:   A.  Breo 200 -1 inhalation 1 time per day  B.  OTC Nasacort -1 spray one time per day  3.  Treat and prevent headaches and sleep dysfunction:   A.  Cyproheptadine 4 mg tablet at bedtime  B.  Slowly taper off all forms of caffeine  4.  If needed:   A.  Proventil or similar 2 inhalations every 4-6 hours  5.  Return to clinic in 3 weeks or earlier if problem

## 2018-02-03 ENCOUNTER — Encounter: Payer: Self-pay | Admitting: Allergy and Immunology

## 2018-02-09 DIAGNOSIS — H16403 Unspecified corneal neovascularization, bilateral: Secondary | ICD-10-CM | POA: Diagnosis not present

## 2018-02-09 DIAGNOSIS — H2513 Age-related nuclear cataract, bilateral: Secondary | ICD-10-CM | POA: Diagnosis not present

## 2018-02-09 DIAGNOSIS — L259 Unspecified contact dermatitis, unspecified cause: Secondary | ICD-10-CM | POA: Diagnosis not present

## 2018-02-23 DIAGNOSIS — T7840XS Allergy, unspecified, sequela: Secondary | ICD-10-CM | POA: Diagnosis not present

## 2018-02-25 ENCOUNTER — Other Ambulatory Visit: Payer: Self-pay | Admitting: Hematology and Oncology

## 2018-04-19 ENCOUNTER — Other Ambulatory Visit: Payer: Self-pay | Admitting: Allergy and Immunology

## 2018-07-22 ENCOUNTER — Telehealth: Payer: Self-pay | Admitting: Hematology and Oncology

## 2018-07-22 NOTE — Telephone Encounter (Signed)
Unable to reach patient - called per 3/5 sch message - and unable to leave message - vmail full

## 2018-07-26 ENCOUNTER — Inpatient Hospital Stay: Payer: BLUE CROSS/BLUE SHIELD | Attending: Hematology and Oncology | Admitting: Hematology and Oncology

## 2018-07-26 NOTE — Assessment & Plan Note (Signed)
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%, PR 100%, HER-2 negative Pathological stage: T1 cN0 M0 ER 100%, PR 100%, HER-2 negative ratio 0.9, margins negative, 2 SLN negative  Left breast:Clinical T1 BN 0 M0 invasive ductal carcinoma ER positive PR 45%, HER-2 negative Pathologic stage: T1b N0 M0 ER 100% PR 45% HER-2 negative ratio 1.1 margins clear, 3 sentinel nodes negative Oncotype DX recurrence score 16, 10% risk of recurrence  Current treatment:Tamoxifen 20 mg daily started January 2016switched to anastrozoleJuly 2017stopped November 2017 because of hot flashes, could not tolerate tamoxifen even at 10 mg dose and she discontinued it;resumed it 01/08/2018 at 10 mg daily  Multiple complaints 1. Back pain: PET CT scan 01/19/2018: No evidence of metastatic disease 2. left axillary nodules: Breast MRI: 01/23/2018: Stable lymph nodes with normal morphology.,  No evidence of malignancy 3. Blood in the stool:  sees Dr. Collene Mares   I instructed the patient to follow with her primary care physician regarding her complaints of skin rash as well as the swelling around the eyelids.  There is no concern for metastatic breast cancer at this time.

## 2018-08-05 ENCOUNTER — Other Ambulatory Visit: Payer: Self-pay

## 2018-08-05 ENCOUNTER — Telehealth: Payer: Self-pay | Admitting: *Deleted

## 2018-08-05 NOTE — Telephone Encounter (Signed)
Pt states she noticed white spots inside her vagina. No discharge,itching or odor. Pt would like to make appt to see Dr. Phineas Real Will route to scheduling to make visit for pt.

## 2018-08-06 ENCOUNTER — Ambulatory Visit: Payer: BLUE CROSS/BLUE SHIELD | Admitting: Gynecology

## 2018-08-09 ENCOUNTER — Other Ambulatory Visit: Payer: Self-pay

## 2018-08-10 DIAGNOSIS — C50919 Malignant neoplasm of unspecified site of unspecified female breast: Secondary | ICD-10-CM | POA: Diagnosis not present

## 2018-08-10 DIAGNOSIS — Q388 Other congenital malformations of pharynx: Secondary | ICD-10-CM | POA: Diagnosis not present

## 2018-08-10 DIAGNOSIS — I700 Atherosclerosis of aorta: Secondary | ICD-10-CM | POA: Diagnosis not present

## 2018-08-10 DIAGNOSIS — E559 Vitamin D deficiency, unspecified: Secondary | ICD-10-CM | POA: Diagnosis not present

## 2018-08-11 ENCOUNTER — Ambulatory Visit: Payer: BLUE CROSS/BLUE SHIELD | Admitting: Gynecology

## 2018-08-11 ENCOUNTER — Other Ambulatory Visit: Payer: Self-pay

## 2018-08-11 ENCOUNTER — Encounter: Payer: Self-pay | Admitting: Gynecology

## 2018-08-11 VITALS — BP 122/82

## 2018-08-11 DIAGNOSIS — N9089 Other specified noninflammatory disorders of vulva and perineum: Secondary | ICD-10-CM | POA: Diagnosis not present

## 2018-08-11 DIAGNOSIS — L900 Lichen sclerosus et atrophicus: Secondary | ICD-10-CM

## 2018-08-11 DIAGNOSIS — R350 Frequency of micturition: Secondary | ICD-10-CM

## 2018-08-11 NOTE — Progress Notes (Signed)
    RAIGAN BARIA [birth year] [id number]        58 y.o.  G3P3003 presents with 2 issues:  1. Whitish areas noted on her vulva during self exam.  No symptoms such as itching, irritation, discharge. 2. Urinary frequency.  Patient notes most recently she has had increased urinary frequency.  No dysuria urgency low back pain fever or chills.  Past medical history,surgical history, problem list, medications, allergies, family history and social history were all reviewed and documented in the EPIC chart.  Directed ROS with pertinent positives and negatives documented in the history of present illness/assessment and plan.  Exam: [name] assistant Vitals:   08/11/18 1128  BP: 122/82   General appearance:  Normal Abdomen soft nontender without masses guarding rebound Pelvic external BUS vagina with atrophic changes.  Symmetrical blanching of the skin from the periclitoral region along both labia majora and the perineal body region consistent with lichen sclerosis.  Cervix with atrophic changes.  Uterus normal size midline mobile nontender.  Adnexa without masses or tenderness.  Physical Exam  Genitourinary:      Procedure: Upper right labia majora was cleansed with Betadine, infiltrated with 1% lidocaine and a representative skin biopsy was taken sharply.  Silver nitrate hemostasis applied afterwards.  Assessment/Plan:  58 y.o. G3P3003 with:  1. Exam consistent with lichen sclerosis.  Representative biopsy taken.  Will call patient with results.  We discussed lichen sclerosus in detail.  Natural history of the disease.  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  Need for continued self vulvar exams.  Treatment options with high-dose steroid cream such as clobetasol reviewed.  Whether treatment in the asymptomatic patient affords any benefit discussed.  At this point will await biopsy results.  As long as she is asymptomatic then will hold on treatment.  She will continue with self vulvar  exams and report any changes. 2. Urinary frequency.  No other symptoms to suggest UTI.  Urine analysis shows few bacteria 6-10 WBCs 0-5 epithelial.  Will await culture results and treat if positive.  Will follow-up if urinary frequency continues.    [name] MD, 12:01 PM 08/11/2018

## 2018-08-11 NOTE — Patient Instructions (Signed)
Office will call you with biopsy results 

## 2018-08-13 ENCOUNTER — Encounter: Payer: Self-pay | Admitting: Gynecology

## 2018-08-13 LAB — URINALYSIS, COMPLETE W/RFL CULTURE
Bilirubin Urine: NEGATIVE
Glucose, UA: NEGATIVE
Hgb urine dipstick: NEGATIVE
Hyaline Cast: NONE SEEN /LPF
Ketones, ur: NEGATIVE
Nitrites, Initial: NEGATIVE
Protein, ur: NEGATIVE
RBC / HPF: NONE SEEN /HPF (ref 0–2)
Specific Gravity, Urine: 1.01 (ref 1.001–1.03)
pH: 6.5 (ref 5.0–8.0)

## 2018-08-13 LAB — PATHOLOGY

## 2018-08-13 LAB — URINE CULTURE
MICRO NUMBER:: 355412
SPECIMEN QUALITY:: ADEQUATE

## 2018-08-13 LAB — TISSUE SPECIMEN

## 2018-08-13 LAB — CULTURE INDICATED

## 2018-12-30 ENCOUNTER — Other Ambulatory Visit: Payer: Self-pay

## 2018-12-31 ENCOUNTER — Encounter: Payer: BLUE CROSS/BLUE SHIELD | Admitting: Gynecology

## 2019-01-03 DIAGNOSIS — E559 Vitamin D deficiency, unspecified: Secondary | ICD-10-CM | POA: Diagnosis not present

## 2019-01-03 DIAGNOSIS — E538 Deficiency of other specified B group vitamins: Secondary | ICD-10-CM | POA: Diagnosis not present

## 2019-01-03 DIAGNOSIS — Z79899 Other long term (current) drug therapy: Secondary | ICD-10-CM | POA: Diagnosis not present

## 2019-01-03 DIAGNOSIS — E0500 Thyrotoxicosis with diffuse goiter without thyrotoxic crisis or storm: Secondary | ICD-10-CM | POA: Diagnosis not present

## 2019-01-03 DIAGNOSIS — R7989 Other specified abnormal findings of blood chemistry: Secondary | ICD-10-CM | POA: Diagnosis not present

## 2019-01-05 ENCOUNTER — Other Ambulatory Visit: Payer: Self-pay

## 2019-01-05 DIAGNOSIS — I700 Atherosclerosis of aorta: Secondary | ICD-10-CM | POA: Diagnosis not present

## 2019-01-05 DIAGNOSIS — C50919 Malignant neoplasm of unspecified site of unspecified female breast: Secondary | ICD-10-CM | POA: Diagnosis not present

## 2019-01-05 DIAGNOSIS — N951 Menopausal and female climacteric states: Secondary | ICD-10-CM | POA: Diagnosis not present

## 2019-01-05 DIAGNOSIS — E0590 Thyrotoxicosis, unspecified without thyrotoxic crisis or storm: Secondary | ICD-10-CM | POA: Diagnosis not present

## 2019-01-06 ENCOUNTER — Ambulatory Visit (INDEPENDENT_AMBULATORY_CARE_PROVIDER_SITE_OTHER): Payer: BC Managed Care – PPO | Admitting: Gynecology

## 2019-01-06 ENCOUNTER — Encounter: Payer: Self-pay | Admitting: Gynecology

## 2019-01-06 VITALS — BP 122/76 | Ht 63.0 in | Wt 116.0 lb

## 2019-01-06 DIAGNOSIS — L900 Lichen sclerosus et atrophicus: Secondary | ICD-10-CM | POA: Diagnosis not present

## 2019-01-06 DIAGNOSIS — Z01419 Encounter for gynecological examination (general) (routine) without abnormal findings: Secondary | ICD-10-CM | POA: Diagnosis not present

## 2019-01-06 DIAGNOSIS — N952 Postmenopausal atrophic vaginitis: Secondary | ICD-10-CM

## 2019-01-06 DIAGNOSIS — R2232 Localized swelling, mass and lump, left upper limb: Secondary | ICD-10-CM

## 2019-01-06 DIAGNOSIS — N951 Menopausal and female climacteric states: Secondary | ICD-10-CM | POA: Diagnosis not present

## 2019-01-06 NOTE — Progress Notes (Signed)
    Brandy [name] [birth year] [id number]        58 y.o.  [id number] for annual gynecologic exam.  Notes a lump under her left arm that is been there for months.  She is status post bilateral mastectomies for breast cancer.  The area is not tender although it seems like it is slowly getting larger.  Past medical history,surgical history, problem list, medications, allergies, family history and social history were all reviewed and documented as reviewed in the EPIC chart.  ROS:  Performed with pertinent positives and negatives included in the history, assessment and plan.   Additional significant findings : None   Exam: [name] assistant Vitals:   01/06/19 1413  BP: 122/76  Weight: 116 lb (52.6 kg)  Height: 5\' 3"  (1.6 m)   Body mass index is 20.55 kg/m.  General appearance:  Normal affect, orientation and appearance. Skin: Grossly normal HEENT: Without gross lesions.  No cervical or supraclavicular adenopathy. Thyroid normal.  Lungs:  Clear without wheezing, rales or rhonchi Cardiac: RR, without RMG Abdominal:  Soft, nontender, without masses, guarding, rebound, organomegaly or hernia Breasts:  Examined lying and sitting.  Bilateral mastectomies with reconstruction.  No breast masses, skin changes.  Right axilla normal.  Left axilla with 1 cm mass mobile mid axilla.  No overlying skin changes. Pelvic:  Ext, BUS, Vagina: With atrophic changes.  Symmetrical blanching of the skin from the periclitoral region along labia majora to the perineal body bilaterally consistent with her history of lichen sclerosis.  No concerning lesions.  Cervix: With atrophic changes  Uterus: Anteverted, normal size, shape and contour, midline and mobile nontender   Adnexa: Without masses or tenderness    Anus and perineum: Normal   Rectovaginal: Normal sphincter tone without palpated masses or tenderness.    Assessment/Plan:  58 y.o. G64P3003 female for annual gynecologic exam.   1. Postmenopausal.   Having some hot flushes and sweats.  We discussed options.  Issues of soy-based products in patient with breast cancer discussed.  Contraindication to estrogen products reviewed.  Availability of prescription nonhormonal such as Effexor or also discussed.  At this point the patient prefers to monitor.  She is having no bleeding. 2. History of breast cancer status post bilateral mastectomies with reconstruction.  Left axillary nodule.  Noted for several months.  Mobile nontender.  Recommend starting with ultrasound and possible general surgery referral.  She knows to call me if she does not hear about the ultrasound scheduling and the next week or so.  Continues on tamoxifen.  Actively followed by oncology. 3. Lichen sclerosis.  Biopsy-proven.  Asymptomatic.  Exam without concerning changes.  Follow-up if any symptoms develop. 4. Pap smear/HPV 2019.  No Pap smear done today.  No history of abnormal Pap smears.  Plan repeat Pap smear/HPV at 5-year interval per current screening guidelines. 5. DEXA never.  Continues on tamoxifen.  Will plan at age 62. 38. Colonoscopy 2018.  Repeat at their recommended interval. 7. Health maintenance.  Reports routine blood work recently done at her [facility] office.  Follow-up in 1 year, sooner as needed.   [name] MD, 2:46 PM 01/06/2019

## 2019-01-06 NOTE — Patient Instructions (Signed)
Office will call to arrange for the ultrasound of the left armpit.  Call if you do not hear from them within the next week or so.

## 2019-01-07 ENCOUNTER — Telehealth: Payer: Self-pay | Admitting: *Deleted

## 2019-01-07 DIAGNOSIS — R2232 Localized swelling, mass and lump, left upper limb: Secondary | ICD-10-CM

## 2019-01-07 DIAGNOSIS — L720 Epidermal cyst: Secondary | ICD-10-CM | POA: Diagnosis not present

## 2019-01-07 NOTE — Telephone Encounter (Signed)
-----   Message from [name], MD sent at 01/06/2019  2:52 PM EDT ----- Schedule left axillary ultrasound at the breast center reference 1 cm mid axillary nodule in patient with history of breast cancer status post bilateral mastectomies

## 2019-01-10 ENCOUNTER — Encounter: Payer: Self-pay | Admitting: Hematology and Oncology

## 2019-01-10 ENCOUNTER — Encounter: Payer: Self-pay | Admitting: *Deleted

## 2019-01-10 NOTE — Telephone Encounter (Signed)
Patient scheduled on 01/19/19 @ 01/19/19 @ 12:20pm, sent my chart message with time and date.

## 2019-01-11 ENCOUNTER — Encounter: Payer: Self-pay | Admitting: Hematology and Oncology

## 2019-01-11 NOTE — Telephone Encounter (Signed)
Patient did read mychart message.

## 2019-01-19 ENCOUNTER — Ambulatory Visit
Admission: RE | Admit: 2019-01-19 | Discharge: 2019-01-19 | Disposition: A | Discharge status: Home / Self Care | Payer: BC Managed Care – PPO | Source: Ambulatory Visit | Attending: Gynecology | Admitting: Gynecology

## 2019-01-19 ENCOUNTER — Other Ambulatory Visit: Payer: Self-pay

## 2019-01-19 DIAGNOSIS — R2232 Localized swelling, mass and lump, left upper limb: Secondary | ICD-10-CM

## 2019-01-19 DIAGNOSIS — N6489 Other specified disorders of breast: Secondary | ICD-10-CM | POA: Diagnosis not present

## 2019-01-27 DIAGNOSIS — E559 Vitamin D deficiency, unspecified: Secondary | ICD-10-CM | POA: Diagnosis not present

## 2019-01-27 DIAGNOSIS — J301 Allergic rhinitis due to pollen: Secondary | ICD-10-CM | POA: Diagnosis not present

## 2019-01-27 DIAGNOSIS — E0500 Thyrotoxicosis with diffuse goiter without thyrotoxic crisis or storm: Secondary | ICD-10-CM | POA: Diagnosis not present

## 2019-01-27 DIAGNOSIS — R5381 Other malaise: Secondary | ICD-10-CM | POA: Diagnosis not present

## 2019-02-03 DIAGNOSIS — K5904 Chronic idiopathic constipation: Secondary | ICD-10-CM | POA: Diagnosis not present

## 2019-02-03 DIAGNOSIS — K625 Hemorrhage of anus and rectum: Secondary | ICD-10-CM | POA: Diagnosis not present

## 2019-02-03 DIAGNOSIS — R140 Abdominal distension (gaseous): Secondary | ICD-10-CM | POA: Diagnosis not present

## 2019-02-03 DIAGNOSIS — R682 Dry mouth, unspecified: Secondary | ICD-10-CM | POA: Diagnosis not present

## 2019-02-15 ENCOUNTER — Encounter: Payer: Self-pay | Admitting: Gynecology

## 2019-04-29 DIAGNOSIS — R5381 Other malaise: Secondary | ICD-10-CM | POA: Diagnosis not present

## 2019-04-29 DIAGNOSIS — E0500 Thyrotoxicosis with diffuse goiter without thyrotoxic crisis or storm: Secondary | ICD-10-CM | POA: Diagnosis not present

## 2019-05-02 DIAGNOSIS — I700 Atherosclerosis of aorta: Secondary | ICD-10-CM | POA: Diagnosis not present

## 2019-05-02 DIAGNOSIS — E0590 Thyrotoxicosis, unspecified without thyrotoxic crisis or storm: Secondary | ICD-10-CM | POA: Diagnosis not present

## 2019-05-02 DIAGNOSIS — G43909 Migraine, unspecified, not intractable, without status migrainosus: Secondary | ICD-10-CM | POA: Diagnosis not present

## 2019-05-02 DIAGNOSIS — C50919 Malignant neoplasm of unspecified site of unspecified female breast: Secondary | ICD-10-CM | POA: Diagnosis not present

## 2019-05-06 ENCOUNTER — Ambulatory Visit: Payer: BC Managed Care – PPO | Admitting: Gynecology

## 2019-05-10 ENCOUNTER — Ambulatory Visit: Payer: BC Managed Care – PPO | Admitting: Gynecology

## 2019-05-16 ENCOUNTER — Other Ambulatory Visit: Payer: Self-pay

## 2019-05-16 ENCOUNTER — Ambulatory Visit: Payer: BC Managed Care – PPO | Admitting: Gynecology

## 2019-05-16 ENCOUNTER — Encounter: Payer: Self-pay | Admitting: Gynecology

## 2019-05-16 VITALS — BP 120/78

## 2019-05-16 DIAGNOSIS — R350 Frequency of micturition: Secondary | ICD-10-CM | POA: Diagnosis not present

## 2019-05-16 DIAGNOSIS — R1032 Left lower quadrant pain: Secondary | ICD-10-CM | POA: Diagnosis not present

## 2019-05-16 LAB — URINALYSIS, COMPLETE W/RFL CULTURE
Bacteria, UA: NONE SEEN /HPF
Bilirubin Urine: NEGATIVE
Glucose, UA: NEGATIVE
Hyaline Cast: NONE SEEN /LPF
Ketones, ur: NEGATIVE
Leukocyte Esterase: NEGATIVE
Nitrites, Initial: NEGATIVE
Protein, ur: NEGATIVE
RBC / HPF: NONE SEEN /HPF (ref 0–2)
Specific Gravity, Urine: 1.01 (ref 1.001–1.03)
WBC, UA: NONE SEEN /HPF (ref 0–5)
pH: 6 (ref 5.0–8.0)

## 2019-05-16 LAB — NO CULTURE INDICATED

## 2019-05-16 NOTE — Patient Instructions (Signed)
Follow-up to schedule ultrasound if the left sided pain continues.  Follow-up with general surgery to discuss options for axillary node management  Follow-up if you would like to start on medication for the hot flushes such as Effexor

## 2019-05-16 NOTE — Progress Notes (Signed)
    Brandy [name] 06/06/1960 [id number]        58 y.o.  G3P3003 presents with several issues:  1. Urinary frequency over the last months.  No urgency, dysuria, low back pain, fever or chills. 2. Left lower quadrant pain over the last 2 weeks.  Aching to sharp stabbing.  Having some issues with constipation.  No diarrhea.  No nausea vomiting. 3. Hot flushes with sweats. 4. My thoughts about left axillary lymph nodes and whether to have them excised.  Past medical history,surgical history, problem list, medications, allergies, family history and social history were all reviewed and documented in the EPIC chart.  Directed ROS with pertinent positives and negatives documented in the history of present illness/assessment and plan.  Exam: Blanca assistant BP 120/78 General appearance:  Normal Left axilla with 1 cm mid axillary mobile mass Spine straight without CVA tenderness Abdomen soft nontender without mass guarding rebound Pelvic external BUS vagina with atrophic changes.  Blanching of the skin from the periclitoral to the perianal region bilaterally consistent with history of lichen sclerosis.  Cervix atrophic.  Uterus normal size midline mobile nontender.  Adnexa without masses or tenderness.  Rectal exam is normal  Assessment/Plan:  58 y.o. G3P3003 with:  1. Urinary frequency.  Urine analysis is negative.  No other signs or symptoms of UTI.  No continence.  Will monitor for now. 2. Left lower quadrant pain.  Exam is normal.  Discussed differential to include her constipation contributing to discomfort.  Recommend monitor for now, stool softeners and call if discomfort persists and we will pursue ultrasound rule out nonpalpable ovarian disease.  If discomfort totally resolves and patient will follow. 3. Hot flushes.  We discussed options to include soy with issues of history of breast cancer, prescription nonhormonal such as gabapentin or Effexor.  Side effect potentials reviewed.   At this point the patient wants to think of her options will call back if she would like to start Effexor. 4. Lymph nodes left axilla.  Had ultrasound earlier this year which showed stable benign-appearing lymph nodes.  They had discussed possible excision and follow-up with a general surgeon with the patient and she wanted my opinion.  We reviewed the issue of cannot guarantee without excision although stable over the past 2 years and unlikely malignant.  Recommendations were to follow-up with a general surgeon for their evaluation and recommendation.    [name] MD, 9:54 AM 05/16/2019

## 2019-06-07 DIAGNOSIS — R490 Dysphonia: Secondary | ICD-10-CM | POA: Diagnosis not present

## 2019-06-07 DIAGNOSIS — Z681 Body mass index (BMI) 19 or less, adult: Secondary | ICD-10-CM | POA: Diagnosis not present

## 2019-06-07 DIAGNOSIS — J358 Other chronic diseases of tonsils and adenoids: Secondary | ICD-10-CM | POA: Diagnosis not present

## 2019-06-08 ENCOUNTER — Other Ambulatory Visit: Payer: Self-pay | Admitting: *Deleted

## 2019-06-08 DIAGNOSIS — R1032 Left lower quadrant pain: Secondary | ICD-10-CM

## 2019-06-20 ENCOUNTER — Other Ambulatory Visit: Payer: Self-pay

## 2019-06-21 ENCOUNTER — Ambulatory Visit: Payer: BC Managed Care – PPO | Admitting: Obstetrics and Gynecology

## 2019-06-21 ENCOUNTER — Encounter: Payer: Self-pay | Admitting: Obstetrics and Gynecology

## 2019-06-21 ENCOUNTER — Ambulatory Visit (INDEPENDENT_AMBULATORY_CARE_PROVIDER_SITE_OTHER): Payer: BC Managed Care – PPO

## 2019-06-21 VITALS — BP 118/78

## 2019-06-21 DIAGNOSIS — N9089 Other specified noninflammatory disorders of vulva and perineum: Secondary | ICD-10-CM | POA: Diagnosis not present

## 2019-06-21 DIAGNOSIS — D259 Leiomyoma of uterus, unspecified: Secondary | ICD-10-CM

## 2019-06-21 DIAGNOSIS — L900 Lichen sclerosus et atrophicus: Secondary | ICD-10-CM

## 2019-06-21 DIAGNOSIS — R1032 Left lower quadrant pain: Secondary | ICD-10-CM

## 2019-06-21 DIAGNOSIS — N854 Malposition of uterus: Secondary | ICD-10-CM

## 2019-06-21 NOTE — Patient Instructions (Signed)
Please keep an eye on your lower abdominal pain symptoms and you may want to discuss this further with your primary care physician or GI specialist.  The pelvic ultrasound was normal and your ovaries appeared normal.  There is no attributable gynecologic source for your pain that can be determined at this time.  He certainly can return to the clinic in 1 to 2 months so we can take a look at the vulvar lesion that you have questions about.

## 2019-06-21 NOTE — Progress Notes (Signed)
   Brandy [name]  [birth year] [id number]  HPI The patient is a 59 y.o. G3P3003 who presents today for discussion of pelvic ultrasound results.  In the last few months she has had was "negative" left lower quadrant pain, usually intermittent, today more frequent, no associated precipitating factors.  Mild nausea times no vomiting.  She denies any change in stool volume or caliber or bowel habits.  She states she is chronically somewhat constipated but there is no change with this.  She denies any blood in the stool.  She also mentions that she may have noticed a "spot on the bottom vulvar area that she had questions about given her diagnosis of lichen sclerosis.  She is not having any itching or irritation on the vulvar area.  Past medical history,surgical history, problem list, medications, allergies, family history and social history were all reviewed and documented as reviewed in the EPIC chart.  ROS:  Feeling well. No dyspnea or chest pain on exertion.  No abdominal pain, change in bowel habits, black or bloody stools.  No urinary tract symptoms. GYN ROS: no abnormal bleeding, no discharge.  Physical Exam  BP 118/78   LMP 07/30/2016   General: Pleasant female, no acute distress, alert and oriented PELVIC EXAM: Declined by patient  Pelvic ultrasound Small anteverted uterus 6.68 x 5.0 x 4.3 cm, 2 small fibroids stable from previous ultrasound 1 year ago, the largest of which measures 2.5 cm in maximum dimension, endometrial thickness normal.  Small amount of endometrial cavity fluid.  Bilateral ovaries small and atrophic.  No cysts.  Assessment 1.  Left lower quadrant pain of uncertain etiology 2.  Reported vulvar lesion  Plan 1.  Discussed gust that there is unlikely to be a gynecologic origin to her pelvic pain given the negative pelvic ultrasound today.  I did suggest that she discuss with her GI specialist regarding any further work-up needed and she says she is up-to-date on her  colonoscopy.  Recommend that she keep track of her symptoms and timeliness and relation to other factors and what she is doing at the time she notices the pain.  My thoughts are that it sounds to be more bowel related or possibly musculoskeletal but she preferred not to have a pelvic exam today.  2.  Vulvar lesion none she describes it is difficult to assess without perform pelvic examination.  She would prefer to have this done next time.  I told her to monitor the area and let us know if there are any changes and she is welcome to come back in 1 to 2 months for an evaluation of the vulvar lesion.  She states she has a history of lichen sclerosis discussed that this does potentially increase risk for VIN or vulvar cancers so she needs to be cognizant of that.  All questions were answered by the end of the visit.   Joseph Pierini MD 06/21/19

## 2019-09-05 DIAGNOSIS — E0500 Thyrotoxicosis with diffuse goiter without thyrotoxic crisis or storm: Secondary | ICD-10-CM | POA: Diagnosis not present

## 2019-09-05 DIAGNOSIS — I700 Atherosclerosis of aorta: Secondary | ICD-10-CM | POA: Diagnosis not present

## 2019-09-05 DIAGNOSIS — G43909 Migraine, unspecified, not intractable, without status migrainosus: Secondary | ICD-10-CM | POA: Diagnosis not present

## 2019-09-05 DIAGNOSIS — R1032 Left lower quadrant pain: Secondary | ICD-10-CM | POA: Diagnosis not present

## 2019-09-05 DIAGNOSIS — L900 Lichen sclerosus et atrophicus: Secondary | ICD-10-CM | POA: Diagnosis not present

## 2019-09-05 DIAGNOSIS — J45998 Other asthma: Secondary | ICD-10-CM | POA: Diagnosis not present

## 2019-09-06 ENCOUNTER — Other Ambulatory Visit: Payer: Self-pay | Admitting: Endocrinology

## 2019-09-06 ENCOUNTER — Other Ambulatory Visit (HOSPITAL_COMMUNITY): Payer: Self-pay | Admitting: Endocrinology

## 2019-09-06 DIAGNOSIS — C50919 Malignant neoplasm of unspecified site of unspecified female breast: Secondary | ICD-10-CM

## 2019-09-06 DIAGNOSIS — J358 Other chronic diseases of tonsils and adenoids: Secondary | ICD-10-CM

## 2019-09-19 ENCOUNTER — Ambulatory Visit (HOSPITAL_COMMUNITY)
Admission: RE | Admit: 2019-09-19 | Discharge: 2019-09-19 | Disposition: A | Discharge status: Home / Self Care | Payer: BC Managed Care – PPO | Source: Ambulatory Visit | Attending: Endocrinology | Admitting: Endocrinology

## 2019-09-19 ENCOUNTER — Other Ambulatory Visit: Payer: Self-pay

## 2019-09-19 DIAGNOSIS — J358 Other chronic diseases of tonsils and adenoids: Secondary | ICD-10-CM | POA: Insufficient documentation

## 2019-09-19 DIAGNOSIS — C50919 Malignant neoplasm of unspecified site of unspecified female breast: Secondary | ICD-10-CM | POA: Diagnosis not present

## 2019-09-19 LAB — GLUCOSE, CAPILLARY: Glucose-Capillary: 93 mg/dL (ref 70–99)

## 2019-09-19 MED ORDER — FLUDEOXYGLUCOSE F - 18 (FDG) INJECTION
6.6000 | Freq: Once | INTRAVENOUS | Status: AC
  Administered 2019-09-19: 13:00:00 6.6 via INTRAVENOUS

## 2019-10-03 DIAGNOSIS — C50911 Malignant neoplasm of unspecified site of right female breast: Secondary | ICD-10-CM | POA: Diagnosis not present

## 2019-11-03 ENCOUNTER — Emergency Department (HOSPITAL_COMMUNITY)
Admission: EM | Admit: 2019-11-03 | Discharge: 2019-11-03 | Disposition: A | Discharge status: Home / Self Care | Payer: BC Managed Care – PPO | Attending: Emergency Medicine | Admitting: Emergency Medicine

## 2019-11-03 ENCOUNTER — Emergency Department (HOSPITAL_COMMUNITY): Payer: BC Managed Care – PPO

## 2019-11-03 ENCOUNTER — Other Ambulatory Visit: Payer: Self-pay

## 2019-11-03 ENCOUNTER — Encounter (HOSPITAL_COMMUNITY): Payer: Self-pay

## 2019-11-03 DIAGNOSIS — R42 Dizziness and giddiness: Secondary | ICD-10-CM | POA: Diagnosis not present

## 2019-11-03 DIAGNOSIS — Z853 Personal history of malignant neoplasm of breast: Secondary | ICD-10-CM | POA: Insufficient documentation

## 2019-11-03 DIAGNOSIS — R079 Chest pain, unspecified: Secondary | ICD-10-CM | POA: Diagnosis not present

## 2019-11-03 DIAGNOSIS — R748 Abnormal levels of other serum enzymes: Secondary | ICD-10-CM | POA: Insufficient documentation

## 2019-11-03 DIAGNOSIS — R197 Diarrhea, unspecified: Secondary | ICD-10-CM

## 2019-11-03 DIAGNOSIS — R112 Nausea with vomiting, unspecified: Secondary | ICD-10-CM | POA: Diagnosis not present

## 2019-11-03 DIAGNOSIS — R55 Syncope and collapse: Secondary | ICD-10-CM

## 2019-11-03 DIAGNOSIS — R0789 Other chest pain: Secondary | ICD-10-CM | POA: Insufficient documentation

## 2019-11-03 DIAGNOSIS — R1110 Vomiting, unspecified: Secondary | ICD-10-CM | POA: Diagnosis not present

## 2019-11-03 DIAGNOSIS — R0602 Shortness of breath: Secondary | ICD-10-CM | POA: Diagnosis not present

## 2019-11-03 DIAGNOSIS — R1084 Generalized abdominal pain: Secondary | ICD-10-CM | POA: Insufficient documentation

## 2019-11-03 LAB — CBC WITH DIFFERENTIAL/PLATELET
Abs Immature Granulocytes: 0.03 10*3/uL (ref 0.00–0.07)
Basophils Absolute: 0 10*3/uL (ref 0.0–0.1)
Basophils Relative: 0 %
Eosinophils Absolute: 0 10*3/uL (ref 0.0–0.5)
Eosinophils Relative: 0 %
HCT: 40.7 % (ref 36.0–46.0)
Hemoglobin: 13.4 g/dL (ref 12.0–15.0)
Immature Granulocytes: 0 %
Lymphocytes Relative: 3 %
Lymphs Abs: 0.2 10*3/uL — ABNORMAL LOW (ref 0.7–4.0)
MCH: 30.5 pg (ref 26.0–34.0)
MCHC: 32.9 g/dL (ref 30.0–36.0)
MCV: 92.7 fL (ref 80.0–100.0)
Monocytes Absolute: 0.2 10*3/uL (ref 0.1–1.0)
Monocytes Relative: 3 %
Neutro Abs: 7.3 10*3/uL (ref 1.7–7.7)
Neutrophils Relative %: 94 %
Platelets: 164 10*3/uL (ref 150–400)
RBC: 4.39 MIL/uL (ref 3.87–5.11)
RDW: 12.2 % (ref 11.5–15.5)
WBC: 7.8 10*3/uL (ref 4.0–10.5)
nRBC: 0 % (ref 0.0–0.2)

## 2019-11-03 LAB — TROPONIN I (HIGH SENSITIVITY)
Troponin I (High Sensitivity): <2 ng/L (ref <18)
Troponin I (High Sensitivity): <2 ng/L (ref <18)

## 2019-11-03 LAB — COMPREHENSIVE METABOLIC PANEL
ALT: 31 U/L (ref 0–44)
AST: 34 U/L (ref 15–41)
Albumin: 3.8 g/dL (ref 3.5–5.0)
Alkaline Phosphatase: 56 U/L (ref 38–126)
Anion gap: 8 (ref 5–15)
BUN: 15 mg/dL (ref 6–20)
CO2: 24 mmol/L (ref 22–32)
Calcium: 8.9 mg/dL (ref 8.9–10.3)
Chloride: 106 mmol/L (ref 98–111)
Creatinine, Ser: 0.78 mg/dL (ref 0.44–1.00)
GFR calc Af Amer: >60 mL/min (ref >60)
GFR calc non Af Amer: >60 mL/min (ref >60)
Glucose, Bld: 137 mg/dL — ABNORMAL HIGH (ref 70–99)
Potassium: 4.1 mmol/L (ref 3.5–5.1)
Sodium: 138 mmol/L (ref 135–145)
Total Bilirubin: 0.7 mg/dL (ref 0.3–1.2)
Total Protein: 6.1 g/dL — ABNORMAL LOW (ref 6.5–8.1)

## 2019-11-03 LAB — LIPASE, BLOOD: Lipase: 52 U/L — ABNORMAL HIGH (ref 11–51)

## 2019-11-03 LAB — D-DIMER, QUANTITATIVE: D-Dimer, Quant: 0.36 ug/mL-FEU (ref 0.00–0.50)

## 2019-11-03 MED ORDER — SODIUM CHLORIDE 0.9 % IV BOLUS
1000.0000 mL | Freq: Once | INTRAVENOUS | Status: AC
  Administered 2019-11-03: 1000 mL via INTRAVENOUS

## 2019-11-03 MED ORDER — PROMETHAZINE HCL 25 MG PO TABS
25.0000 mg | ORAL_TABLET | Freq: Four times a day (QID) | ORAL | 0 refills | Status: DC | PRN
Start: 2019-11-03 — End: 2020-05-15

## 2019-11-03 MED ORDER — MORPHINE SULFATE (PF) 4 MG/ML IV SOLN
4.0000 mg | Freq: Once | INTRAVENOUS | Status: AC
  Administered 2019-11-03: 4 mg via INTRAVENOUS
  Filled 2019-11-03: qty 1

## 2019-11-03 MED ORDER — PANTOPRAZOLE SODIUM 40 MG IV SOLR
40.0000 mg | Freq: Once | INTRAVENOUS | Status: AC
  Administered 2019-11-03: 40 mg via INTRAVENOUS
  Filled 2019-11-03: qty 40

## 2019-11-03 MED ORDER — IOHEXOL 300 MG/ML  SOLN
100.0000 mL | Freq: Once | INTRAMUSCULAR | Status: AC | PRN
  Administered 2019-11-03: 100 mL via INTRAVENOUS

## 2019-11-03 MED ORDER — FAMOTIDINE 20 MG PO TABS
20.0000 mg | ORAL_TABLET | Freq: Two times a day (BID) | ORAL | 0 refills | Status: DC
Start: 2019-11-03 — End: 2022-12-29

## 2019-11-03 MED ORDER — PROMETHAZINE HCL 25 MG/ML IJ SOLN
12.5000 mg | Freq: Once | INTRAMUSCULAR | Status: AC
  Administered 2019-11-03: 12.5 mg via INTRAVENOUS
  Filled 2019-11-03: qty 1

## 2019-11-03 NOTE — ED Provider Notes (Signed)
[facility] EMERGENCY DEPARTMENT Provider Note   CSN: [id number] Arrival date & time: 11/03/19  0534   History Chief Complaint  Patient presents with  . Chest Pain  . Loss of Consciousness  . Emesis   Brandy [name] is a 59 y.o. female with past medical history significant for cancer in remission, hypothyroidism, hypercholesterolemia who presents for evaluation for complaints.  Patient states around 9 PM yesterday she developed sudden onset burning sensation to her chest and into her throat.  States she had multiple episodes of NBNB emesis.  Patient states she also started with abdominal cramping and diarrhea.  Patient states she has had too many episodes to count of her emesis and diarrhea.  She states she feels extremely weak.  Had to crawl to the bathroom as she was unable to stand due to weakness.  States while having 2 episodes of emesis she had 2 separate syncopal events.  She describes her chest pain as a "burning sensation."  Has seen Dr. Collene Mares with GI previously.  States her last colonoscopy approximately 4 years ago however she does not think she had any "abnormality."  Patient states "I feel so weak and I do not feel right."  She denies palpitations.  Denies unilateral leg swelling, redness, warmth.  States she does get some chest pain and dyspnea on exertion at baseline.  Patient states she has not been evaluated for this as she thought this was "due to my old age."  No prior history of diverticulitis, recent antibiotic use.  Has not been seen by cardiology previously for her chest pain.  Denies additional aggravating or alleviating factors.  She denies any melena, bright red blood per rectum.  History obtained from patient and past medical records.  No interpreter is used.  HPI     Past Medical History:  Diagnosis Date  . Asthma    adult onset  . Breast cancer ([place]) 2015   bilateral, status post bilateral mastectomy BRCA testing negative  . High  cholesterol   . Hyperthyroidism    last check it was normal 3 months ago  . 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 PONV (postoperative nausea and vomiting)   . Recurrent upper respiratory infection (URI)   . Urticaria   . 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 Bilateral breast cancer ([place]) 01/16/2014  . Chest pain 11/29/2013  . Dyspnea 11/29/2013  . Palpitations 11/29/2013  . VIN (vulval intraepithelial neoplasia) I   . CONSTIPATION 01/18/2009  . WEIGHT GAIN 01/18/2009  . BLOOD IN STOOL, OCCULT 01/18/2009  . GERD 01/12/2009  . NAUSEA 01/12/2009  . VOMITING 01/12/2009  . ABDOMINAL PAIN, UNSPECIFIED SITE 01/12/2009  . DIABETES MELLITUS, GESTATIONAL, HX OF 01/12/2009    Past Surgical History:  Procedure Laterality Date  . AUGMENTATION MAMMAPLASTY Bilateral    11/2014  . BILATERAL TOTAL MASTECTOMY WITH AXILLARY LYMPH NODE DISSECTION  2015  . BREAST SURGERY     Implants post bilateral mastectomy  . CARDIAC CATHETERIZATION  2008  . COLONOSCOPY  10/2013   polyps were normal  . DILATATION & CURETTAGE/HYSTEROSCOPY WITH MYOSURE N/A 11/09/2014   Procedure: DILATATION & CURETTAGE/HYSTEROSCOPY WITH MYOSURE;  Surgeon: [name], MD;  Location: [facility];  Service: Gynecology;  Laterality: N/A;  1:00pm OR time requested.  one hour needed  does not need [employer] rep available.  Marland Kitchen MASTECTOMY Bilateral    04/2014  OB History    Gravida  3   Para  3   Term  3   Preterm      AB      Living  3     SAB      TAB      Ectopic      Multiple      Live Births              Family History  Problem Relation Age of Onset  . Heart disease Father 84  . Hypertension Father   . Diabetes Father   . Diabetes Mother   . Allergic rhinitis Mother   . Asthma Mother   . Hypertension Sister   . Diabetes Sister   . Allergic rhinitis Sister   . Asthma Sister   . Hypertension Brother     . Diabetes Brother   . Stroke Brother   . Cancer Brother        Lung  . Breast cancer Maternal Aunt        40's  . Breast cancer Maternal Grandmother 80  . Cancer Maternal Grandfather        colon  . Heart disease Paternal Grandmother   . Diabetes Paternal Grandmother   . Heart disease Paternal Grandfather   . Diabetes Paternal Grandfather   . Breast cancer Maternal Aunt        40's  . Cancer Maternal Aunt        brain; ? primary; deceased 65  . Allergic rhinitis Sister   . Asthma Sister   . Allergic rhinitis Son   . Asthma Son   . Eczema Neg Hx   . Urticaria Neg Hx     Social History   Tobacco Use  . Smoking status: Never Smoker  . Smokeless tobacco: Never Used  Vaping Use  . Vaping Use: Never used  Substance Use Topics  . Alcohol use: No    Alcohol/week: 0.0 standard drinks  . Drug use: No    Home Medications Prior to Admission medications   Medication Sig Start Date End Date Taking? Authorizing Provider  acetaminophen (TYLENOL) 500 MG tablet Take 1,000 mg by mouth 2 (two) times daily as needed for mild pain.   Yes [provider]  albuterol (PROVENTIL HFA;VENTOLIN HFA) 108 (90 BASE) MCG/ACT inhaler Inhale 2 puffs into the lungs every 6 (six) hours as needed for wheezing or shortness of breath (asthma).   Yes [provider]  ALPRAZolam Duanne Moron) 0.5 MG tablet Take 0.5 mg by mouth 4 (four) times daily as needed for anxiety or sleep. Take 1/2 to 1 tablet daily as needed for acute anxiety/insomnia 09/09/11  Yes [provider]  tamoxifen (NOLVADEX) 20 MG tablet Take 1 tablet (20 mg total) by mouth daily. 08/11/16  Yes Nicholas Lose, MD  Vitamin D, Ergocalciferol, (DRISDOL) 50000 units CAPS capsule Take 50,000 Units by mouth every 7 (seven) days.   Yes [provider]    Allergies    Zofran Alvis Lemmings hcl]  Review of Systems   Review of Systems  Constitutional: Positive for activity change and fatigue. Negative for appetite  change, chills, diaphoresis, fever and unexpected weight change.  Respiratory: Negative.   Cardiovascular: Positive for chest pain. Negative for palpitations and leg swelling.  Gastrointestinal: Positive for abdominal pain, diarrhea, nausea and vomiting. Negative for abdominal distention, blood in stool, constipation and rectal pain.  Genitourinary: Negative.   Musculoskeletal: Negative.   Skin: Negative.   Neurological: Positive for  syncope, weakness and light-headedness. Negative for dizziness, tremors, seizures, facial asymmetry, speech difficulty, numbness and headaches.  All other systems reviewed and are negative.   Physical Exam Updated Vital Signs BP 108/68   Pulse 93   Temp 98.6 F (37 C) (Oral)   Resp (!) 26   Ht _0  (1.626 m)   Wt 50.8 kg   LMP 07/30/2016   SpO2 98%   BMI 19.22 kg/m   Physical Exam Vitals and nursing note reviewed.  Constitutional:      General: She is not in acute distress.    Appearance: She is well-developed. She is not toxic-appearing.  HENT:     Head: Atraumatic.  Eyes:     Pupils: Pupils are equal, round, and reactive to light.  Cardiovascular:     Rate and Rhythm: Normal rate.     Pulses:          Radial pulses are 2+ on the right side and 2+ on the left side.       Dorsalis pedis pulses are 2+ on the right side and 2+ on the left side.     Heart sounds: Normal heart sounds.  Pulmonary:     Effort: Pulmonary effort is normal. No respiratory distress.     Breath sounds: Normal breath sounds.     Comments: Speaks in full sentences without difficulty.  Clear to auscultation bilaterally Chest:     Comments: Equal rise and fall of chest.  No chest tenderness palpation, crepitus or step-offs. Abdominal:     General: Bowel sounds are increased. There is no distension.     Palpations: Abdomen is soft.     Tenderness: There is generalized abdominal tenderness. There is guarding. There is no right CVA tenderness or left CVA tenderness.      Hernia: No hernia is present.     Comments: Diffuse tenderness with guarding.  No rebound.  No overlying skin changes to abdominal wall.  Hyperactive bowel sounds  Musculoskeletal:        General: Normal range of motion.     Cervical back: Normal range of motion.     Comments: Moves all 4 extremities without difficulty.  No bony tenderness.  Compartments soft.  Bevelyn Buckles' sign negative  Skin:    General: Skin is warm and dry.     Capillary Refill: Capillary refill takes 2 to 3 seconds.     Comments: No edema, erythema or warmth.  Fluctuance or induration.  No rashes.  Has some dried stool down her legs.  Neurological:     General: No focal deficit present.     Mental Status: She is alert.     Sensory: Sensation is intact.     Motor: Motor function is intact.     Coordination: Coordination is intact.     Comments: Cranial nerves II through XII grossly intact.  No facial droop.  Equal handgrip bilaterally 5/5 strength bilateral upper and lower extremities without difficulty.    ED Results / Procedures / Treatments   Labs (all labs ordered are listed, but only abnormal results are displayed) Labs Reviewed  CBC WITH DIFFERENTIAL/PLATELET  COMPREHENSIVE METABOLIC PANEL  LIPASE, BLOOD  D-DIMER, QUANTITATIVE (NOT AT [facility])  URINALYSIS, ROUTINE W REFLEX MICROSCOPIC  TROPONIN I (HIGH SENSITIVITY)    EKG EKG Interpretation  Date/Time:  Thursday November 03 2019 05:35:24 EDT Ventricular Rate:  90 PR Interval:    QRS Duration: 92 QT Interval:  368 QTC Calculation: 451 R Axis:   51  Text Interpretation: Sinus rhythm Right atrial enlargement Borderline T wave abnormalities Otherwise no significant change Confirmed by Addison Lank [phone]) on 11/03/2019 5:41:22 AM   Radiology No results found.  Procedures Procedures (including critical care time)  Medications Ordered in ED Medications  pantoprazole (PROTONIX) injection 40 mg (has no administration in time range)  sodium chloride 0.9 %  bolus 1,000 mL (has no administration in time range)  morphine 4 MG/ML injection 4 mg (has no administration in time range)  promethazine (PHENERGAN) injection 12.5 mg (has no administration in time range)   ED Course  I have reviewed the triage vital signs and the nursing notes.  Pertinent labs & imaging results that were available during my care of the patient were reviewed by me and considered in my medical decision making (see chart for details).  59 year old female presents for evaluation of nausea, vomiting, diarrhea as well as syncope and chest pain.  She is afebrile, nonseptic, not ill-appearing.  Has a burning sensation to central chest and into the throat.  Abdomen diffusely tender with guarding.  No overlying skin changes.  Hyperactive bowel sounds.  Patient with nonfocal neuro exam without deficits.  No recent antibiotic use, travel or sick contacts.  History of breast cancer in remission however no evidence of DVT on exam clinically.  Initially mildly tachycardic in room to her rate to 100.  Plan on labs, imaging and reassess. Hold on Orthostatic VS currently as soft pressures in room.  Patient's chest pain sounds more GI related than ACS, PE however given she has had some exertional chest pain previously as well as mildly tachycardic in room, unable to [facility] will obtain cardiac enzymes.  Care transferred to [facility], PA-C who will follow up on labs, imaging, reassess and determine ultimate disposition.      MDM Rules/Calculators/A&P                           Final Clinical Impression(s) / ED Diagnoses Final diagnoses:  Generalized abdominal pain  Nausea vomiting and diarrhea  Syncope, unspecified syncope type    Rx / DC Orders ED Discharge Orders    None       Woodley Petzold A, PA-C 11/03/19 [place], [place], MD 11/04/19 [phone]

## 2019-11-03 NOTE — ED Provider Notes (Signed)
Received patient at signout from [facility].  Refer to provider note for full history and physical examination.  Briefly, patient is a 59 year old female with history of cancer in remission, hypothyroidism, hyperlipidemia presenting for evaluation of chest pain, nausea, vomiting, diarrhea, abdominal pain.  Pending chest pain and abdominal pain work-up.  If D-dimer is positive, plan for CTA of the chest and CT abdomen pelvis with contrast.  She is receiving symptomatic management as well as IV fluids.  Could consider obtaining orthostatics as well.  Physical Exam  BP 108/68   Pulse 93   Temp 98.6 F (37 C) (Oral)   Resp (!) 26   Ht 5\' 4"  (1.626 m)   Wt 50.8 kg   LMP 07/30/2016   SpO2 98%   BMI 19.22 kg/m   Physical Exam Vitals and nursing note reviewed.  Constitutional:      General: She is not in acute distress.    Appearance: She is well-developed.     Comments: Appears fatigued but resting comfortably in bed  HENT:     Head: Normocephalic and atraumatic.  Eyes:     General:        Right eye: No discharge.        Left eye: No discharge.     Conjunctiva/sclera: Conjunctivae normal.  Neck:     Vascular: No JVD.     Trachea: No tracheal deviation.  Cardiovascular:     Rate and Rhythm: Tachycardia present.     Comments: Mildly tachycardic, heart rates occasionally in the upper 90s up to 108 bpm Pulmonary:     Effort: Pulmonary effort is normal.  Abdominal:     General: There is no distension.  Skin:    General: Skin is warm and dry.     Findings: No erythema.  Neurological:     Mental Status: She is alert.  Psychiatric:        Behavior: Behavior normal.     ED Course/Procedures     Procedures  MDM   Labs Reviewed  CBC WITH DIFFERENTIAL/PLATELET - Abnormal; Notable for the following components:      Result Value   Lymphs Abs 0.2 (*)    All other components within normal limits  COMPREHENSIVE METABOLIC PANEL - Abnormal; Notable for the following components:    Glucose, Bld 137 (*)    Total Protein 6.1 (*)    All other components within normal limits  LIPASE, BLOOD - Abnormal; Notable for the following components:   Lipase 52 (*)    All other components within normal limits  D-DIMER, QUANTITATIVE (NOT AT [facility])  URINALYSIS, ROUTINE W REFLEX MICROSCOPIC  TROPONIN I (HIGH SENSITIVITY)  TROPONIN I (HIGH SENSITIVITY)   CT ABDOMEN PELVIS W CONTRAST  Result Date: 11/03/2019 CLINICAL DATA:  Abdominal pain with nausea and vomiting as well as mid chest pain beginning last night. EXAM: CT ABDOMEN AND PELVIS WITH CONTRAST TECHNIQUE: Multidetector CT imaging of the abdomen and pelvis was performed using the standard protocol following bolus administration of intravenous contrast. CONTRAST:  130mL OMNIPAQUE IOHEXOL 300 MG/ML  SOLN COMPARISON:  12/16/2017 FINDINGS: Lower chest: Lung bases are normal. Partially visualized breast implants are intact and unchanged. Hepatobiliary: The liver, gallbladder and biliary tree are normal. Pancreas: Normal. Spleen: Normal. Adrenals/Urinary Tract: Adrenal glands are normal. Kidneys are normal in size without hydronephrosis or nephrolithiasis. Ureters and bladder are normal. Stomach/Bowel: Stomach and small bowel are normal. Appendix is at the upper limits of normal in diameter. There is no adjacent free  fluid or inflammatory change. Colon is decompressed from the transverse colon distally. Vascular/Lymphatic: Abdominal aorta demonstrates minimal calcified plaque without aneurysmal dilatation. No adenopathy. Reproductive: Unremarkable.  Mildly prominent periuterine veins. Other: No free fluid or focal inflammatory change. Musculoskeletal: Mild degenerative change of the spine. IMPRESSION: 1.  No acute findings in the abdomen/pelvis. 2.  Aortic Atherosclerosis (ICD10-I70.0). Electronically Signed   By: Marin Olp M.D.   On: 11/03/2019 10:11   DG Chest Portable 1 View  Result Date: 11/03/2019 CLINICAL DATA:  Chest pain EXAM: PORTABLE  CHEST 1 VIEW COMPARISON:  August 02, 2010 FINDINGS: The heart size and mediastinal contours are within normal limits. Both lungs are clear. The visualized skeletal structures are unremarkable. IMPRESSION: No active disease. Electronically Signed   By: Prudencio Pair M.D.   On: 11/03/2019 06:54    Chest x-ray shows no acute cardiopulmonary abnormalities.  D-dimer was negative so we proceeded with CT abdomen pelvis with contrast alone given overall low suspicion for PE.  This showed no evidence of acute surgical abdominal pathology.  Lab work reviewed and interpreted by myself shows no leukocytosis, no anemia, no metabolic derangements, no renal insufficiency.  Her lipase is very mildly elevated but not so elevated to suggest pancreatitis and overall I have a low suspicion of pancreatitis with no CT evidence of this.  Serial troponins are negative and I doubt ACS/MI at this time.  Doubt dissection, cardiac tamponade, esophageal rupture, pneumonia or pneumothorax.  On reevaluation patient is resting comfortably.  She is tolerated sips of fluids.  She reports feeling fatigued but that her symptoms overall have improved significantly.  Orthostatic vital signs were obtained and she is not orthostatic.  She was mildly tachycardic on reassessment but this improved with IV fluids.  Discussed symptomatic management with Phenergan, Pepcid, advancing diet slowly and pushing fluids.  Recommend follow-up with PCP for reevaluation of symptoms and for recheck of lipase to ensure improvement.  Discussed strict ED return precautions.  Patient and son verbalized understanding of and agreement with plan and patient is stable for discharge at this time.        Renita Papa, PA-C 11/03/19 [place], [place], MD 11/04/19 [phone]

## 2019-11-03 NOTE — Discharge Instructions (Addendum)
1. Medications: You can take 1 to 2 tablets of Tylenol (350mg -1000mg  depending on the dose) every 6 hours as needed for pain.  Do not exceed 4000 mg of Tylenol daily.  If your pain persists you can take a doses of ibuprofen in between doses of Tylenol.  I usually recommend 400 to 600 mg of ibuprofen every 6 hours.  Take this with food to avoid upset stomach issues.  Take Phenergan as needed for nausea.  Wait around 20 minutes before eating or drinking after taking this medication to give this medicine time to work.  Be aware this medicine can cause drowsiness.  You can also take Pepcid for abdominal discomfort twice daily with meals. 2. Treatment: rest, drink plenty of fluids, advance diet slowly.  Start with water and broth then advance to bland foods that will not upset your stomach such as crackers, mashed potatoes, and peanut butter.  Avoid spicy foods, fried foods, alcohol, or acidic foods. 3. Follow Up: Please followup with your primary doctor in 3 days for discussion of your diagnoses and further evaluation after today's visit; if you do not have a primary care doctor use the resource guide provided to find one; Please return to the ER for persistent vomiting, high fevers or worsening symptoms

## 2019-11-03 NOTE — ED Triage Notes (Signed)
Pt brought to ED from home via EMS with c/o chest pain, n/v/d that began last night around 9PM. Pt states CP is centralized, burning sensation; does not radiate. Pt reports she passed out x2 this AM while vomiting. EMS reports EKG unremarkable, NSR. Given 324 asp, 200 NS en route. Pt currently A&Ox4, NAD, VSS.

## 2019-11-03 NOTE — ED Notes (Signed)
Discharge instructions reviewed with pt. No further questions for staff at this time.  

## 2019-11-04 NOTE — ED Provider Notes (Signed)
Attestation: Medical screening examination/treatment/procedure(s) were conducted as a shared visit with non-physician practitioner(s) and myself.  I personally evaluated the patient during the encounter.   Briefly, the patient is a 59 y.o. female with h/o cancer in remission, hypothyroidism, hypercholesterolemia who presents for upper abd burning radiating to chest with emesis and diarrhea.  Vitals:   11/03/19 1230 11/03/19 1256  BP: 128/74 126/75  Pulse: 94 (!) 101  Resp: 18 (!) 22  Temp:  99.8 F (37.7 C)  SpO2: 99% 98%    CONSTITUTIONAL:  ill-appearing, but nontoxic. NAD NEURO:  Alert and oriented x 3, no focal deficits EYES:  pupils equal and reactive ENT/NECK:  trachea midline, no JVD CARDIO:  reg rate, reg rhythm, well-perfused PULM:  None labored breathing GI/GU:  Abdomin non-distended MSK/SPINE:  No gross deformities, no edema SKIN:  no rash, atraumatic PSYCH:  Appropriate speech and behavior   EKG Interpretation  Date/Time:  Thursday November 03 2019 05:35:24 EDT Ventricular Rate:  90 PR Interval:    QRS Duration: 92 QT Interval:  368 QTC Calculation: 451 R Axis:   51 Text Interpretation: Sinus rhythm Right atrial enlargement Borderline T wave abnormalities Otherwise no significant change Confirmed by Addison Lank [phone]) on 11/03/2019 5:41:22 AM       Work up reassuring w/o serious intra-abdominal inflammatory/infectious process or bowel obstruction.  Chest discomfort is consistent with GI etiology.  Low suspicion for primary cardiac process.        Fatima Blank, MD 11/04/19 [phone]

## 2019-11-07 DIAGNOSIS — E0590 Thyrotoxicosis, unspecified without thyrotoxic crisis or storm: Secondary | ICD-10-CM | POA: Diagnosis not present

## 2019-11-07 DIAGNOSIS — E0500 Thyrotoxicosis with diffuse goiter without thyrotoxic crisis or storm: Secondary | ICD-10-CM | POA: Diagnosis not present

## 2019-11-08 DIAGNOSIS — R194 Change in bowel habit: Secondary | ICD-10-CM | POA: Diagnosis not present

## 2019-11-08 DIAGNOSIS — K5904 Chronic idiopathic constipation: Secondary | ICD-10-CM | POA: Diagnosis not present

## 2019-11-08 DIAGNOSIS — Z86010 Personal history of colonic polyps: Secondary | ICD-10-CM | POA: Diagnosis not present

## 2019-11-08 DIAGNOSIS — K5730 Diverticulosis of large intestine without perforation or abscess without bleeding: Secondary | ICD-10-CM | POA: Diagnosis not present

## 2019-11-08 DIAGNOSIS — R110 Nausea: Secondary | ICD-10-CM | POA: Diagnosis not present

## 2019-11-09 DIAGNOSIS — R109 Unspecified abdominal pain: Secondary | ICD-10-CM | POA: Diagnosis not present

## 2019-11-09 DIAGNOSIS — G43909 Migraine, unspecified, not intractable, without status migrainosus: Secondary | ICD-10-CM | POA: Diagnosis not present

## 2019-11-09 DIAGNOSIS — R82998 Other abnormal findings in urine: Secondary | ICD-10-CM | POA: Diagnosis not present

## 2019-11-09 DIAGNOSIS — R319 Hematuria, unspecified: Secondary | ICD-10-CM | POA: Diagnosis not present

## 2019-11-09 DIAGNOSIS — J45998 Other asthma: Secondary | ICD-10-CM | POA: Diagnosis not present

## 2019-12-02 ENCOUNTER — Telehealth: Payer: Self-pay | Admitting: Hematology and Oncology

## 2019-12-02 NOTE — Telephone Encounter (Signed)
Called pt per 7/19 vmail. No answer . And vmail full

## 2019-12-05 ENCOUNTER — Inpatient Hospital Stay: Payer: BC Managed Care – PPO | Attending: Hematology and Oncology | Admitting: Hematology and Oncology

## 2019-12-05 NOTE — Assessment & Plan Note (Deleted)
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%, PR 100%, HER-2 negative Pathological stage: T1 cN0 M0 ER 100%, PR 100%, HER-2 negative ratio 0.9, margins negative, 2 SLN negative  Left breast:Clinical T1 BN 0 M0 invasive ductal carcinoma ER positive PR 45%, HER-2 negative Pathologic stage: T1b N0 M0 ER 100% PR 45% HER-2 negative ratio 1.1 margins clear, 3 sentinel nodes negative Oncotype DX recurrence score 16, 10% risk of recurrence  Current treatment:Tamoxifen 20 mg daily started January 2016switched to anastrozoleJuly 2017stopped November 2017 because of hot flashes, could not tolerate tamoxifen even at 10 mg dose and she discontinued it;resumed it 01/08/2018 at 10 mg daily  Multiple complaints 1. Back pain: PET CT scan 01/19/2018: No evidence of metastatic disease 2. left axillary nodules: Breast MRI: 01/23/2018: Stable lymph nodes with normal morphology.,  No evidence of malignancy 3. Blood in the stool:  sees Dr. Collene Mares  Surveillance: Physical exams no palpable lumps or nodules bilateral mastectomies with reconstruction Return to clinic in 1 year for follow-up

## 2020-01-25 DIAGNOSIS — H5213 Myopia, bilateral: Secondary | ICD-10-CM | POA: Diagnosis not present

## 2020-01-25 DIAGNOSIS — H524 Presbyopia: Secondary | ICD-10-CM | POA: Diagnosis not present

## 2020-01-30 DIAGNOSIS — Z853 Personal history of malignant neoplasm of breast: Secondary | ICD-10-CM | POA: Diagnosis not present

## 2020-01-30 DIAGNOSIS — Z803 Family history of malignant neoplasm of breast: Secondary | ICD-10-CM | POA: Diagnosis not present

## 2020-02-27 ENCOUNTER — Other Ambulatory Visit (HOSPITAL_COMMUNITY): Payer: Self-pay | Admitting: Gastroenterology

## 2020-02-27 ENCOUNTER — Other Ambulatory Visit: Payer: Self-pay | Admitting: Gastroenterology

## 2020-02-27 DIAGNOSIS — R110 Nausea: Secondary | ICD-10-CM

## 2020-03-26 ENCOUNTER — Encounter (HOSPITAL_COMMUNITY): Payer: BC Managed Care – PPO

## 2020-03-26 ENCOUNTER — Ambulatory Visit (HOSPITAL_COMMUNITY): Payer: BC Managed Care – PPO

## 2020-03-30 ENCOUNTER — Ambulatory Visit (HOSPITAL_COMMUNITY): Payer: BC Managed Care – PPO

## 2020-04-10 ENCOUNTER — Ambulatory Visit (HOSPITAL_COMMUNITY): Payer: BC Managed Care – PPO

## 2020-04-16 ENCOUNTER — Ambulatory Visit (HOSPITAL_COMMUNITY): Payer: BC Managed Care – PPO

## 2020-04-24 DIAGNOSIS — I700 Atherosclerosis of aorta: Secondary | ICD-10-CM | POA: Diagnosis not present

## 2020-04-24 DIAGNOSIS — E0500 Thyrotoxicosis with diffuse goiter without thyrotoxic crisis or storm: Secondary | ICD-10-CM | POA: Diagnosis not present

## 2020-05-01 ENCOUNTER — Encounter (HOSPITAL_COMMUNITY): Payer: BC Managed Care – PPO | Attending: Gastroenterology

## 2020-05-01 ENCOUNTER — Ambulatory Visit (HOSPITAL_COMMUNITY): Payer: BC Managed Care – PPO

## 2020-05-01 ENCOUNTER — Encounter (HOSPITAL_COMMUNITY): Payer: Self-pay

## 2020-05-15 ENCOUNTER — Encounter: Payer: Self-pay | Admitting: Nurse Practitioner

## 2020-05-15 ENCOUNTER — Ambulatory Visit: Payer: BC Managed Care – PPO | Admitting: Nurse Practitioner

## 2020-05-15 ENCOUNTER — Other Ambulatory Visit: Payer: Self-pay

## 2020-05-15 VITALS — BP 120/80

## 2020-05-15 DIAGNOSIS — R350 Frequency of micturition: Secondary | ICD-10-CM

## 2020-05-15 DIAGNOSIS — R1032 Left lower quadrant pain: Secondary | ICD-10-CM | POA: Diagnosis not present

## 2020-05-15 DIAGNOSIS — N950 Postmenopausal bleeding: Secondary | ICD-10-CM | POA: Diagnosis not present

## 2020-05-15 DIAGNOSIS — Z853 Personal history of malignant neoplasm of breast: Secondary | ICD-10-CM

## 2020-05-15 LAB — URINALYSIS, COMPLETE W/RFL CULTURE
Bacteria, UA: NONE SEEN /HPF
Bilirubin Urine: NEGATIVE
Glucose, UA: NEGATIVE
Hgb urine dipstick: NEGATIVE
Hyaline Cast: NONE SEEN /LPF
Ketones, ur: NEGATIVE
Leukocyte Esterase: NEGATIVE
Nitrites, Initial: NEGATIVE
Protein, ur: NEGATIVE
RBC / HPF: NONE SEEN /HPF (ref 0–2)
Specific Gravity, Urine: 1.003 (ref 1.001–1.03)
WBC, UA: NONE SEEN /HPF (ref 0–5)
pH: 5.5 (ref 5.0–8.0)

## 2020-05-15 LAB — NO CULTURE INDICATED

## 2020-05-15 NOTE — Progress Notes (Signed)
   Acute Office Visit  Subjective:    Patient ID: Brandy [name], female    DOB: 13-Jan-1961, 59 y.o.   MRN: [id number]   HPI 59 y.o. presents today for hematuria and frequency. She also had vaginal bleeding a week ago x 3 days. Bleeding was moderately heavy with cramping. Postmenopausal since 2018, no HRT. Ultrasound February 2021 for LLQ pain showed 2 small fibroids, otherwise unremarkable with thin endometrium. Pain has persisted since then but is somewhat better. History of bilateral breast cancer, was on Tamoxifen but stopped taking due to side effects about 6 months ago.    Review of Systems  Constitutional: Negative.   Gastrointestinal: Positive for abdominal pain (LLQ).  Genitourinary: Positive for frequency, hematuria and vaginal bleeding. Negative for difficulty urinating, dysuria, flank pain and urgency.       Objective:    Physical Exam Constitutional:      Appearance: Normal appearance.  Abdominal:     Tenderness: There is abdominal tenderness in the left upper quadrant. There is no right CVA tenderness or left CVA tenderness.  Genitourinary:    General: Normal vulva.     Vagina: Normal.     Cervix: Normal.     Uterus: Normal.      BP 120/80   LMP 07/30/2016  Wt Readings from Last 3 Encounters:  11/03/19 112 lb (50.8 kg)  01/06/19 116 lb (52.6 kg)  02/02/18 116 lb 6.4 oz (52.8 kg)   UA negative     Assessment & Plan:   Problem List Items Addressed This Visit   None   Visit Diagnoses    Postmenopausal bleeding    -  Primary   Relevant Orders   US PELVIS TRANSVAGINAL NON-OB (TV ONLY)   Follicle stimulating hormone   TSH   CBC with Differential/Platelet   Comprehensive metabolic panel   Frequency of urination       Relevant Orders   Urinalysis,Complete w/RFL Culture   History of bilateral breast cancer       LLQ abdominal pain       Relevant Orders   US PELVIS TRANSVAGINAL NON-OB (TV ONLY)     Plan: We will schedule pelvic ultrasound to  evaluate postmenopausal bleeding and LLQ pain, especially given her breast cancer history. UA negative.  She is agreeable to plan.     Olivia Mackie [facility] 4:03 PM 05/15/2020

## 2020-05-16 IMAGING — CT CT ABD-PELV W/O CM
1 of 2 series · 14 of 32 positions shown, 19 images · non-contrast
Comparison: None.

CLINICAL DATA: Acute right flank pain.

EXAM:
CT ABDOMEN AND PELVIS WITHOUT CONTRAST
TECHNIQUE: Multidetector CT imaging of the abdomen and pelvis was performed
following the standard protocol without IV contrast.

[Series 2: abd/pelvis w/(date) · axial · 0.63mm/px · z∈[-352,+18]mm · 14 of 82 slices shown, 19 images]
[im 4/82  soft-tissue]
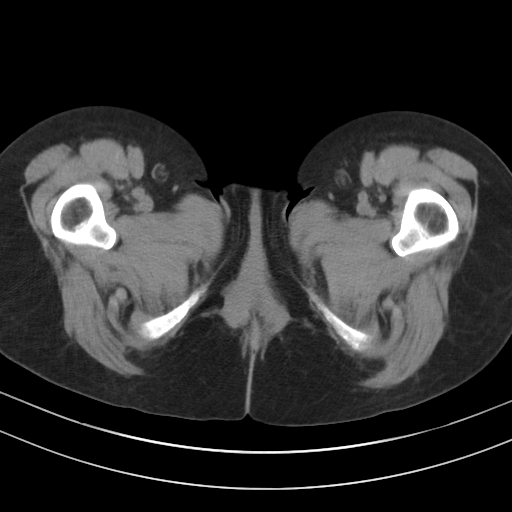
[im 4/82  bone]
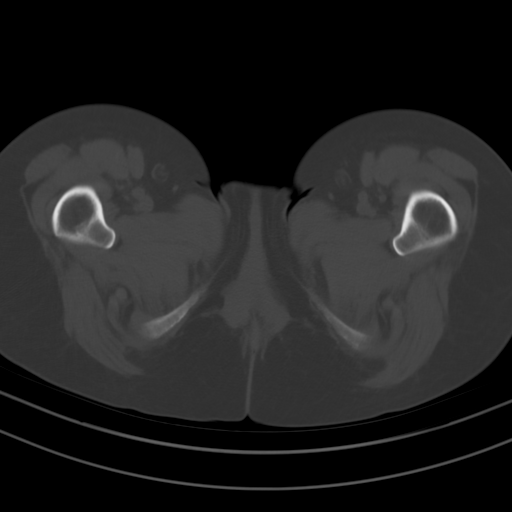
[im 12/82  soft-tissue]
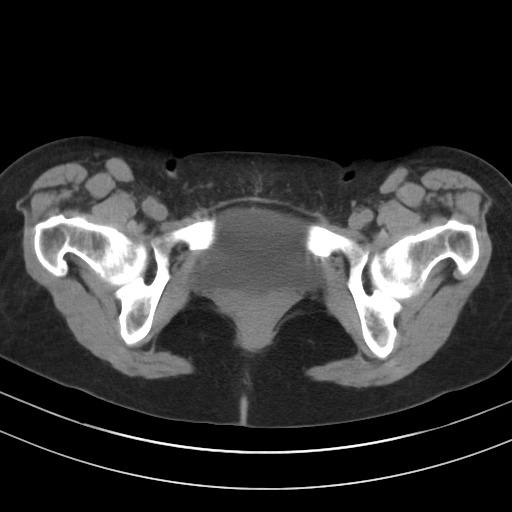
[im 16/82  soft-tissue]
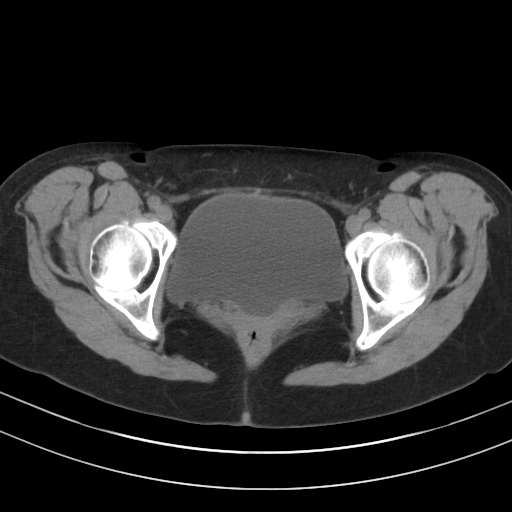
[im 24/82  soft-tissue]
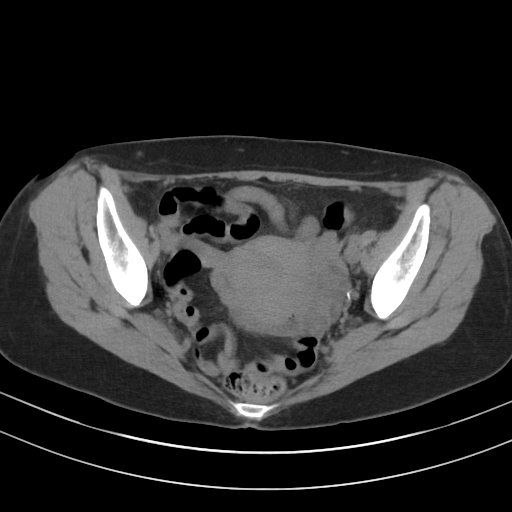
[im 28/82  soft-tissue]
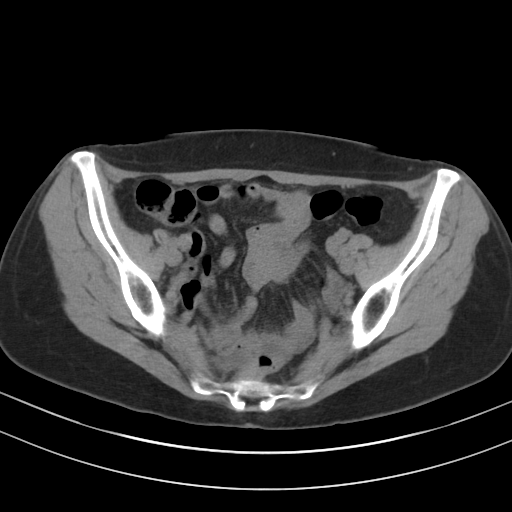
[im 35/82  soft-tissue]
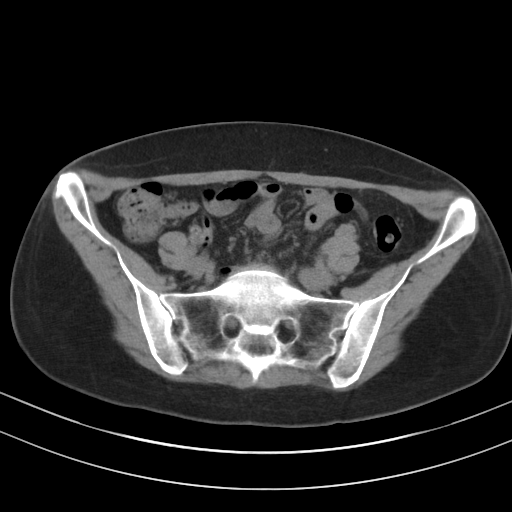
[im 43/82  soft-tissue]
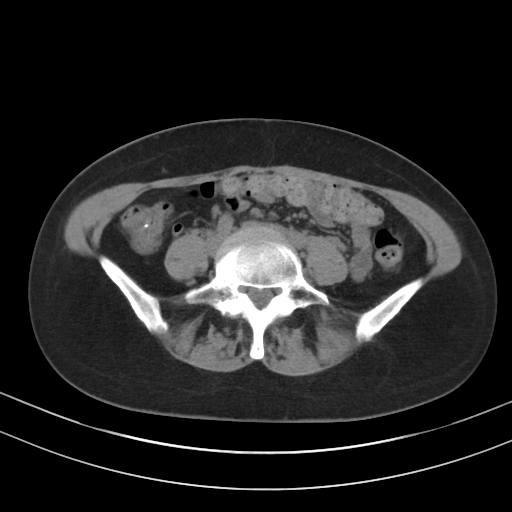
[im 47/82  soft-tissue]
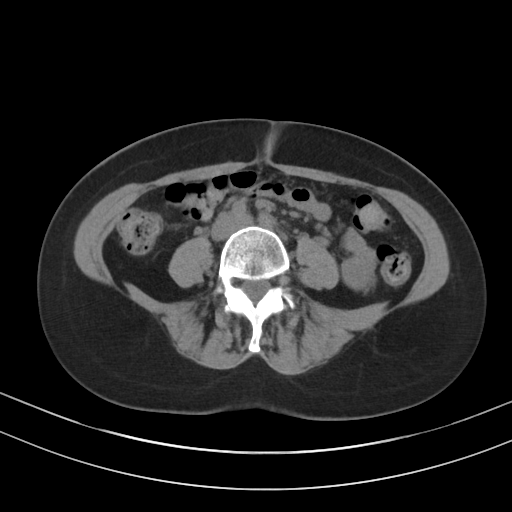
[im 55/82  soft-tissue]
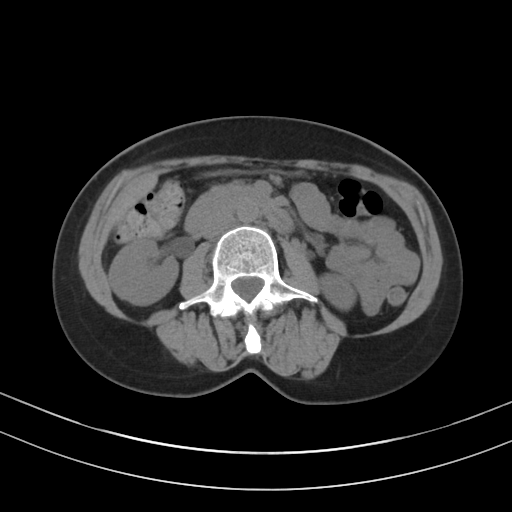
[im 55/82  bone]
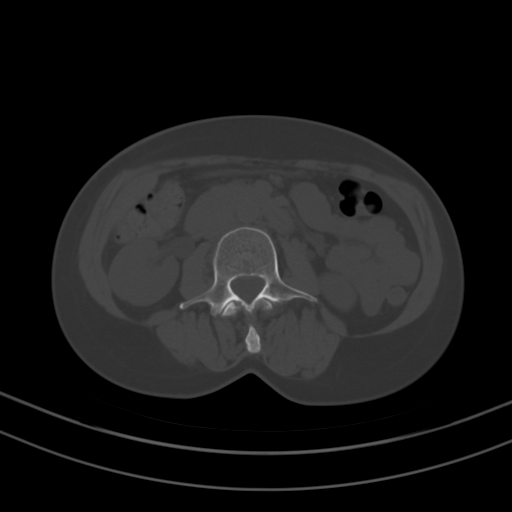
[im 58/82  soft-tissue]
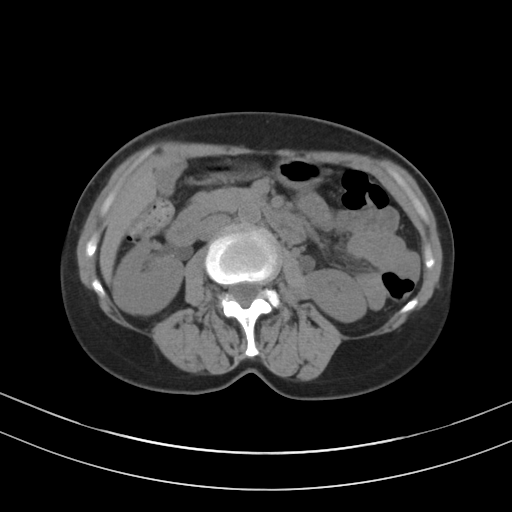
[im 66/82  soft-tissue]
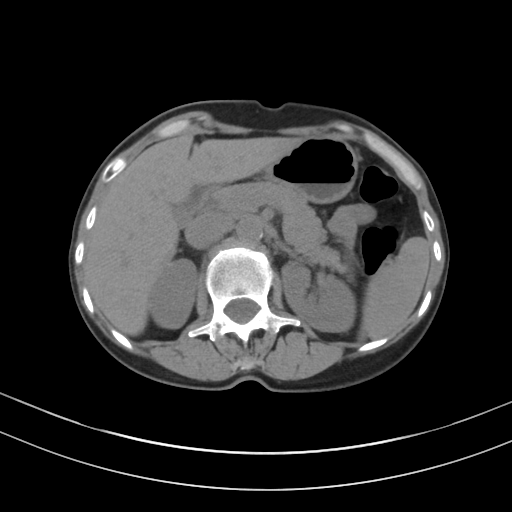
[im 66/82  lung]
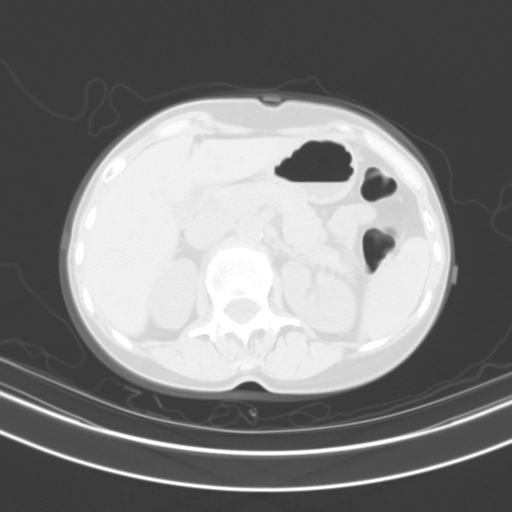
[im 70/82  soft-tissue]
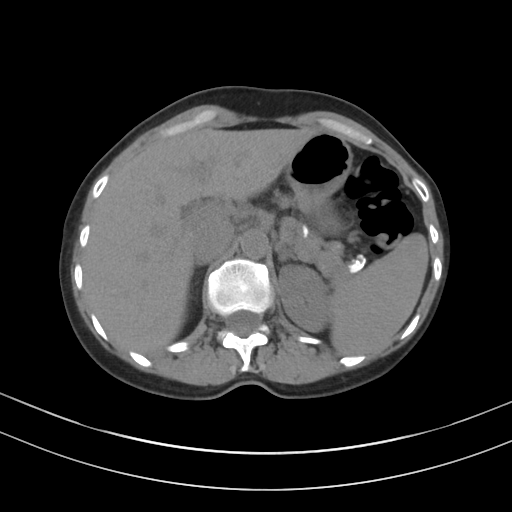
[im 70/82  lung]
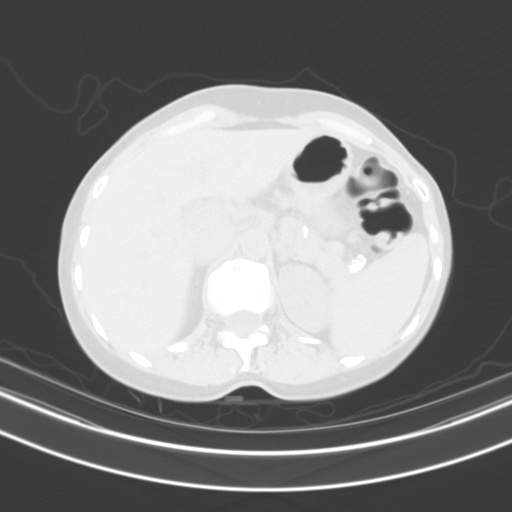
[im 74/82  lung]
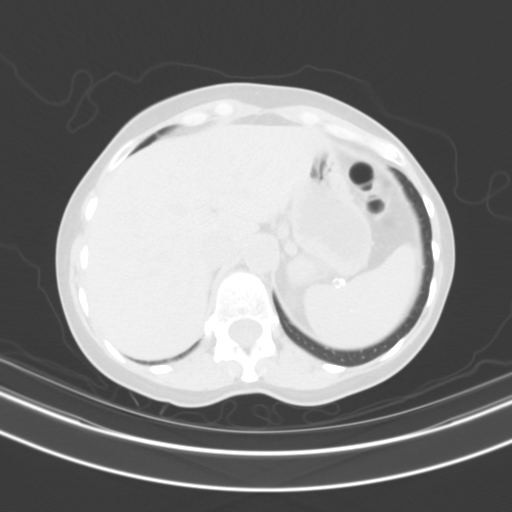
[im 78/82  soft-tissue]
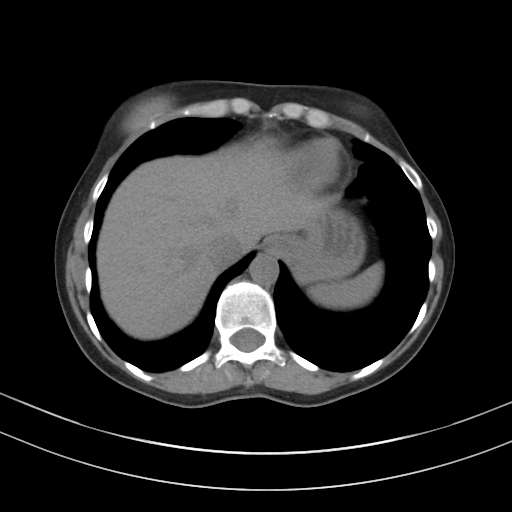
[im 78/82  lung]
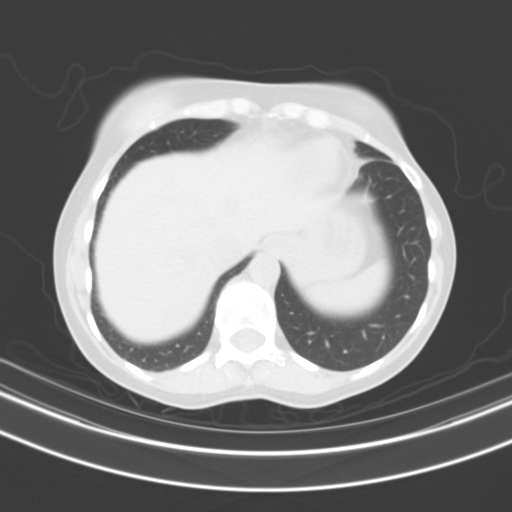

[14 of 32 positions shown; findings below may reference images not displayed]

FINDINGS: Lower chest: No acute abnormality.

Hepatobiliary: No focal liver abnormality is seen. No gallstones,
gallbladder wall thickening, or biliary dilatation.

Pancreas: Unremarkable. No pancreatic ductal dilatation or
surrounding inflammatory changes.

Spleen: Normal in size without focal abnormality.

Adrenals/Urinary Tract: Adrenal glands are unremarkable. Kidneys are
normal, without renal calculi, focal lesion, or hydronephrosis.
Bladder is unremarkable.

Stomach/Bowel: Stomach is within normal limits. Appendix appears
normal. No evidence of bowel wall thickening, distention, or
inflammatory changes.

Vascular/Lymphatic: No significant vascular findings are present. No
enlarged abdominal or pelvic lymph nodes.

Reproductive: Uterus and bilateral adnexa are unremarkable.

Other: No abdominal wall hernia or abnormality. No abdominopelvic
ascites.

Musculoskeletal: No acute or significant osseous findings.
IMPRESSION: No definite abnormality seen in the abdomen or pelvis.

## 2020-05-21 NOTE — Progress Notes (Signed)
Called patient and left detailed message in voice mail per [facility] access note on file.

## 2020-05-29 ENCOUNTER — Ambulatory Visit: Payer: BC Managed Care – PPO | Admitting: Obstetrics and Gynecology

## 2020-05-29 NOTE — Progress Notes (Deleted)
59 y.o. G3P3003 Married White or Caucasian Not Hispanic or Latino female here for annual exam.      Patient's last menstrual period was 07/30/2016.          Sexually active: {yes no:314532}  The current method of family planning is {contraception:315051}.    Exercising: {yes no:314532}  {types:19826} Smoker:  {YES NO:22349}  Health Maintenance: Pap:  *** History of abnormal Pap:  {YES NO:22349} MMG:  *** BMD:   *** Colonoscopy: *** TDaP:  *** Gardasil: ***   reports that she has never smoked. She has never used smokeless tobacco. She reports that she does not drink alcohol and does not use drugs.  Past Medical History:  Diagnosis Date  . Asthma    adult onset  . Breast cancer (HCC) 2015   bilateral, status post bilateral mastectomy BRCA testing negative  . High cholesterol   . Hyperthyroidism    last check it was normal 3 months ago  . 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 PONV (postoperative nausea and vomiting)   . Recurrent upper respiratory infection (URI)   . Urticaria   . 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 AUGMENTATION MAMMAPLASTY Bilateral    11/2014  . BILATERAL TOTAL MASTECTOMY WITH AXILLARY LYMPH NODE DISSECTION  2015  . BREAST SURGERY     Implants post bilateral mastectomy  . CARDIAC CATHETERIZATION  2008  . COLONOSCOPY  10/2013   polyps were normal  . DILATATION & CURETTAGE/HYSTEROSCOPY WITH MYOSURE N/A 11/09/2014   Procedure: DILATATION & CURETTAGE/HYSTEROSCOPY WITH MYOSURE;  Surgeon: Timothy P Fontaine, MD;  Location: [facility];  Service: Gynecology;  Laterality: N/A;  1:00pm OR time requested.  one hour needed  does not need Myosure rep available.  . MASTECTOMY Bilateral    04/2014    Current Outpatient Medications  Medication Sig Dispense Refill  . acetaminophen (TYLENOL) 500 MG tablet Take 1,000 mg by mouth 2 (two) times daily as needed for mild pain.    .  albuterol (PROVENTIL HFA;VENTOLIN HFA) 108 (90 BASE) MCG/ACT inhaler Inhale 2 puffs into the lungs every 6 (six) hours as needed for wheezing or shortness of breath (asthma).    . ALPRAZolam (XANAX) 0.5 MG tablet Take 0.5 mg by mouth 4 (four) times daily as needed for anxiety or sleep. Take 1/2 to 1 tablet daily as needed for acute anxiety/insomnia    . famotidine (PEPCID) 20 MG tablet Take 1 tablet (20 mg total) by mouth 2 (two) times daily. 30 tablet 0  . tamoxifen (NOLVADEX) 20 MG tablet Take 1 tablet (20 mg total) by mouth daily. 90 tablet 3  . Vitamin D, Ergocalciferol, (DRISDOL) 50000 units CAPS capsule Take 50,000 Units by mouth every 7 (seven) days.     No current facility-administered medications for this visit.    Family History  Problem Relation Age of Onset  . Heart disease Father 69  . Hypertension Father   . Diabetes Father   . Diabetes Mother   . Allergic rhinitis Mother   . Asthma Mother   . Hypertension Sister   . Diabetes Sister   . Allergic rhinitis Sister   . Asthma Sister   . Hypertension Brother   . Diabetes Brother   . Stroke Brother   . Cancer Brother        Lung  . Breast cancer Maternal Aunt        40's  . Breast cancer   Maternal Grandmother 15  . Cancer Maternal Grandfather        colon  . Heart disease Paternal Grandmother   . Diabetes Paternal Grandmother   . Heart disease Paternal Grandfather   . Diabetes Paternal Grandfather   . Breast cancer Maternal Aunt        40's  . Cancer Maternal Aunt        brain; ? primary; deceased 68  . Allergic rhinitis Sister   . Asthma Sister   . Allergic rhinitis Son   . Asthma Son   . Eczema Neg Hx   . Urticaria Neg Hx     Review of Systems  Exam:   LMP 07/30/2016   Weight change: _0 @ Height:      Ht Readings from Last 3 Encounters:  11/03/19 5' 4" (1.626 m)  01/06/19 5' 3" (1.6 m)  02/02/18 5' 2.7" (1.593 m)    General appearance: alert, cooperative and appears stated age Head:  Normocephalic, without obvious abnormality, atraumatic Neck: no adenopathy, supple, symmetrical, trachea midline and thyroid {CHL AMB PHY EX THYROID NORM DEFAULT:[phone]::"normal to inspection and palpation"} Lungs: clear to auscultation bilaterally Cardiovascular: regular rate and rhythm Breasts: {Exam; breast:13139::"normal appearance, no masses or tenderness"} Abdomen: soft, non-tender; non distended,  no masses,  no organomegaly Extremities: extremities normal, atraumatic, no cyanosis or edema Skin: Skin color, texture, turgor normal. No rashes or lesions Lymph nodes: Cervical, supraclavicular, and axillary nodes normal. No abnormal inguinal nodes palpated Neurologic: Grossly normal   Pelvic: External genitalia:  no lesions              Urethra:  normal appearing urethra with no masses, tenderness or lesions              Bartholins and Skenes: normal                 Vagina: normal appearing vagina with normal color and discharge, no lesions              Cervix: {CHL AMB PHY EX CERVIX NORM DEFAULT:[phone]::"no lesions"}               Bimanual Exam:  Uterus:  {CHL AMB PHY EX UTERUS NORM [phone]::"normal size, contour, position, consistency, mobility, non-tender"}              Adnexa: {CHL AMB PHY EX ADNEXA NO MASS DEFAULT:[phone]::"no mass, fullness, tenderness"}               Rectovaginal: Confirms               Anus:  normal sphincter tone, no lesions  *** chaperoned for the exam.  A:  Well Woman with normal exam  P:

## 2020-06-07 ENCOUNTER — Ambulatory Visit: Payer: BC Managed Care – PPO | Admitting: Nurse Practitioner

## 2020-06-07 ENCOUNTER — Other Ambulatory Visit: Payer: BC Managed Care – PPO

## 2020-10-30 DIAGNOSIS — C50919 Malignant neoplasm of unspecified site of unspecified female breast: Secondary | ICD-10-CM | POA: Diagnosis not present

## 2020-10-30 DIAGNOSIS — E559 Vitamin D deficiency, unspecified: Secondary | ICD-10-CM | POA: Diagnosis not present

## 2020-10-30 DIAGNOSIS — E0500 Thyrotoxicosis with diffuse goiter without thyrotoxic crisis or storm: Secondary | ICD-10-CM | POA: Diagnosis not present

## 2020-10-30 DIAGNOSIS — I700 Atherosclerosis of aorta: Secondary | ICD-10-CM | POA: Diagnosis not present

## 2020-10-30 DIAGNOSIS — Z79899 Other long term (current) drug therapy: Secondary | ICD-10-CM | POA: Diagnosis not present

## 2020-12-18 DIAGNOSIS — R519 Headache, unspecified: Secondary | ICD-10-CM | POA: Diagnosis not present

## 2020-12-18 DIAGNOSIS — R110 Nausea: Secondary | ICD-10-CM | POA: Diagnosis not present

## 2020-12-18 DIAGNOSIS — R197 Diarrhea, unspecified: Secondary | ICD-10-CM | POA: Diagnosis not present

## 2020-12-18 DIAGNOSIS — R5383 Other fatigue: Secondary | ICD-10-CM | POA: Diagnosis not present

## 2020-12-18 DIAGNOSIS — R0981 Nasal congestion: Secondary | ICD-10-CM | POA: Diagnosis not present

## 2020-12-18 DIAGNOSIS — J029 Acute pharyngitis, unspecified: Secondary | ICD-10-CM | POA: Diagnosis not present

## 2021-06-11 DIAGNOSIS — E559 Vitamin D deficiency, unspecified: Secondary | ICD-10-CM | POA: Diagnosis not present

## 2021-06-11 DIAGNOSIS — E0500 Thyrotoxicosis with diffuse goiter without thyrotoxic crisis or storm: Secondary | ICD-10-CM | POA: Diagnosis not present

## 2021-07-02 NOTE — Progress Notes (Deleted)
61 y.o. G34P3003 Married White or Caucasian Not Hispanic or Latino female here for annual exam.      Patient's last menstrual period was 07/30/2016.          Sexually active: {yes no:314532}  The current method of family planning is {contraception:315051}.    Exercising: {yes no:314532}  {types:19826} Smoker:  {YES NO:22349}  Health Maintenance: Pap:  08/14/17  WNL HR HPV  History of abnormal Pap:  {YES NO:22349} MMG:  see epic has had a mastectomy  BMD:   *** Colonoscopy: 11/05/16 f/u 10 years  TDaP:  *** Gardasil: n/a   reports that she has never smoked. She has never used smokeless tobacco. She reports that she does not drink alcohol and does not use drugs.  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  one hour needed  does not need Myosure rep available.   MASTECTOMY Bilateral    04/2014    Current Outpatient Medications  Medication Sig Dispense Refill   acetaminophen (TYLENOL) 500 MG tablet Take 1,000 mg by mouth 2 (two)  times daily as needed for mild pain.     albuterol (PROVENTIL HFA;VENTOLIN HFA) 108 (90 BASE) MCG/ACT inhaler Inhale 2 puffs into the lungs every 6 (six) hours as needed for wheezing or shortness of breath (asthma).     ALPRAZolam (XANAX) 0.5 MG tablet Take 0.5 mg by mouth 4 (four) times daily as needed for anxiety or sleep. Take 1/2 to 1 tablet daily as needed for acute anxiety/insomnia     famotidine (PEPCID) 20 MG tablet Take 1 tablet (20 mg total) by mouth 2 (two) times daily. 30 tablet 0   tamoxifen (NOLVADEX) 20 MG tablet Take 1 tablet (20 mg total) by mouth daily. 90 tablet 3   Vitamin D, Ergocalciferol, (DRISDOL) 50000 units CAPS capsule Take 50,000 Units by mouth every 7 (seven) days.     No current facility-administered medications for this visit.    Family History  Problem Relation Age of Onset   Heart disease Father 57   Hypertension Father    Diabetes Father    Diabetes Mother    Allergic rhinitis Mother    Asthma Mother    Hypertension Sister    Diabetes Sister    Allergic rhinitis Sister    Asthma Sister    Hypertension Brother    Diabetes Brother    Stroke Brother    Cancer Brother        Lung   Breast  cancer Maternal Aunt        40's   Breast cancer Maternal Grandmother 46   Cancer Maternal Grandfather        colon   Heart disease Paternal Grandmother    Diabetes Paternal Grandmother    Heart disease Paternal Grandfather    Diabetes Paternal Grandfather    Breast cancer Maternal Aunt        1's   Cancer Maternal Aunt        brain; ? primary; deceased 3   Allergic rhinitis Sister    Asthma Sister    Allergic rhinitis Son    Asthma Son    Eczema Neg Hx    Urticaria Neg Hx     Review of Systems  Exam:   LMP 07/30/2016   Weight change: '@WEIGHTCHANGE' @ Height:      Ht Readings from Last 3 Encounters:  11/03/19 '5\' 4"'  (1.626 m)  01/06/19 '5\' 3"'  (1.6 m)  02/02/18 5' 2.7" (1.593 m)    General appearance: alert, cooperative and appears stated  age Head: Normocephalic, without obvious abnormality, atraumatic Neck: no adenopathy, supple, symmetrical, trachea midline and thyroid {CHL AMB PHY EX THYROID NORM DEFAULT:[phone]::"normal to inspection and palpation"} Lungs: clear to auscultation bilaterally Cardiovascular: regular rate and rhythm Breasts: {Exam; breast:13139::"normal appearance, no masses or tenderness"} Abdomen: soft, non-tender; non distended,  no masses,  no organomegaly Extremities: extremities normal, atraumatic, no cyanosis or edema Skin: Skin color, texture, turgor normal. No rashes or lesions Lymph nodes: Cervical, supraclavicular, and axillary nodes normal. No abnormal inguinal nodes palpated Neurologic: Grossly normal   Pelvic: External genitalia:  no lesions              Urethra:  normal appearing urethra with no masses, tenderness or lesions              Bartholins and Skenes: normal                 Vagina: normal appearing vagina with normal color and discharge, no lesions              Cervix: {CHL AMB PHY EX CERVIX NORM DEFAULT:[phone]::"no lesions"}               Bimanual Exam:  Uterus:  {CHL AMB PHY EX UTERUS NORM DEFAULT:[phone]::"normal size, contour, position, consistency, mobility, non-tender"}              Adnexa: {CHL AMB PHY EX ADNEXA NO MASS DEFAULT:[phone]::"no mass, fullness, tenderness"}               Rectovaginal: Confirms               Anus:  normal sphincter tone, no lesions  *** chaperoned for the exam.  A:  Well Woman with normal exam  P:

## 2021-07-10 ENCOUNTER — Ambulatory Visit: Payer: BC Managed Care – PPO | Admitting: Obstetrics and Gynecology

## 2021-07-15 NOTE — Progress Notes (Deleted)
61 y.o. G10P3003 Married White or Caucasian Not Hispanic or Latino female here for annual exam.   ?  ? ?Patient's last menstrual period was 07/30/2016.          ?Sexually active: {yes no:314532}  ?The current method of family planning is {contraception:315051}.    ?Exercising: {yes no:314532}  {types:19826} ?Smoker:  {YES NO:22349} ? ?Health Maintenance: ?Pap:  08-14-17 normal ?History of abnormal Pap:  {YES NO:22349} ?MMG:  01-20-14 Breast cancer Bilateral mastectomy ?BMD:   *** ?Colonoscopy: 2018 small diverticula and hemorrhoids ?TDaP:  *** ?Gardasil: *** ? ? reports that she has never smoked. She has never used smokeless tobacco. She reports that she does not drink alcohol and does not use drugs. ? ?Past Medical History:  ?Diagnosis Date  ? Asthma   ? adult onset  ? Breast cancer ([place]) 2015  ? bilateral, status post bilateral mastectomy BRCA testing negative  ? High cholesterol   ? Hyperthyroidism   ? last check it was normal 3 months ago  ? 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 PONV (postoperative nausea and vomiting)   ? Recurrent upper respiratory infection (URI)   ? Urticaria   ? 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 ? ?Past Surgical History:  ?Procedure Laterality Date  ? AUGMENTATION MAMMAPLASTY Bilateral   ? 11/2014  ? BILATERAL TOTAL MASTECTOMY WITH AXILLARY LYMPH NODE DISSECTION  2015  ? BREAST SURGERY    ? Implants post bilateral mastectomy  ? CARDIAC CATHETERIZATION  2008  ? COLONOSCOPY  10/2013  ? polyps were normal  ? DILATATION & CURETTAGE/HYSTEROSCOPY WITH MYOSURE N/A 11/09/2014  ? Procedure: [place];  Surgeon: [name], MD;  Location: [facility];  Service: Gynecology;  Laterality: N/A;  1:00pm OR time requested. ? ?one hour needed ? ?does not need Myosure rep available.  ? MASTECTOMY Bilateral   ? 04/2014  ? ? ?Current Outpatient Medications  ?Medication Sig Dispense Refill  ? acetaminophen (TYLENOL) 500 MG tablet Take  1,000 mg by mouth 2 (two) times daily as needed for mild pain.    ? albuterol (PROVENTIL HFA;VENTOLIN HFA) 108 (90 BASE) MCG/ACT inhaler Inhale 2 puffs into the lungs every 6 (six) hours as needed for wheezing or shortness of breath (asthma).    ? ALPRAZolam (XANAX) 0.5 MG tablet Take 0.5 mg by mouth 4 (four) times daily as needed for anxiety or sleep. Take 1/2 to 1 tablet daily as needed for acute anxiety/insomnia    ? famotidine (PEPCID) 20 MG tablet Take 1 tablet (20 mg total) by mouth 2 (two) times daily. 30 tablet 0  ? tamoxifen (NOLVADEX) 20 MG tablet Take 1 tablet (20 mg total) by mouth daily. 90 tablet 3  ? Vitamin D, Ergocalciferol, (DRISDOL) 50000 units CAPS capsule Take 50,000 Units by mouth every 7 (seven) days.    ? ?No current facility-administered medications for this visit.  ? ? ?Family History  ?Problem Relation Age of Onset  ? Heart disease Father 72  ? Hypertension Father   ? Diabetes Father   ? Diabetes Mother   ? Allergic rhinitis Mother   ? Asthma Mother   ? Hypertension Sister   ? Diabetes Sister   ? Allergic rhinitis Sister   ? Asthma Sister   ? Hypertension Brother   ? Diabetes Brother   ? Stroke Brother   ? Cancer Brother   ?     Lung  ? Breast cancer Maternal Aunt   ?  18's  ? Breast cancer Maternal Grandmother 80  ? Cancer Maternal Grandfather   ?     colon  ? Heart disease Paternal Grandmother   ? Diabetes Paternal Grandmother   ? Heart disease Paternal Grandfather   ? Diabetes Paternal Grandfather   ? Breast cancer Maternal Aunt   ?     40's  ? Cancer Maternal Aunt   ?     brain; ? primary; deceased 73  ? Allergic rhinitis Sister   ? Asthma Sister   ? Allergic rhinitis Son   ? Asthma Son   ? Eczema Neg Hx   ? Urticaria Neg Hx   ? ? ?Review of Systems ? ?Exam:   ?LMP 07/30/2016   Weight change: '@WEIGHTCHANGE' @ Height:      ?Ht Readings from Last 3 Encounters:  ?11/03/19 '5\' 4"'  (1.626 m)  ?01/06/19 '5\' 3"'  (1.6 m)  ?02/02/18 5' 2.7" (1.593 m)  ? ? ?General appearance: alert, cooperative  and appears stated age ?Head: Normocephalic, without obvious abnormality, atraumatic ?Neck: no adenopathy, supple, symmetrical, trachea midline and thyroid {CHL AMB PHY EX THYROID NORM DEFAULT:[phone]::"normal to inspection and palpation"} ?Lungs: clear to auscultation bilaterally ?Cardiovascular: regular rate and rhythm ?Breasts: {Exam; breast:13139::"normal appearance, no masses or tenderness"} ?Abdomen: soft, non-tender; non distended,  no masses,  no organomegaly ?Extremities: extremities normal, atraumatic, no cyanosis or edema ?Skin: Skin color, texture, turgor normal. No rashes or lesions ?Lymph nodes: Cervical, supraclavicular, and axillary nodes normal. ?No abnormal inguinal nodes palpated ?Neurologic: Grossly normal ? ? ?Pelvic: External genitalia:  no lesions ?             Urethra:  normal appearing urethra with no masses, tenderness or lesions ?             Bartholins and Skenes: normal    ?             Vagina: normal appearing vagina with normal color and discharge, no lesions ?             Cervix: {CHL AMB PHY EX CERVIX NORM [phone]::"no lesions"} ?              ?Bimanual Exam:  Uterus:  {CHL AMB PHY EX UTERUS NORM [phone]::"normal size, contour, position, consistency, mobility, non-tender"} ?             Adnexa: {CHL AMB PHY EX ADNEXA NO MASS [phone]::"no mass, fullness, tenderness"} ?              Rectovaginal: Confirms ?              Anus:  normal sphincter tone, no lesions ? ?*** chaperoned for the exam. ? ?A:  Well Woman with normal exam ? ?P:    ? ?  ?

## 2021-07-19 ENCOUNTER — Ambulatory Visit: Payer: BC Managed Care – PPO | Admitting: Obstetrics and Gynecology

## 2021-09-16 ENCOUNTER — Ambulatory Visit (INDEPENDENT_AMBULATORY_CARE_PROVIDER_SITE_OTHER): Payer: BC Managed Care – PPO | Admitting: Obstetrics and Gynecology

## 2021-09-16 ENCOUNTER — Other Ambulatory Visit (HOSPITAL_COMMUNITY)
Admission: RE | Admit: 2021-09-16 | Discharge: 2021-09-16 | Disposition: A | Discharge status: Home / Self Care | Payer: BC Managed Care – PPO | Source: Ambulatory Visit | Attending: Obstetrics and Gynecology | Admitting: Obstetrics and Gynecology

## 2021-09-16 ENCOUNTER — Encounter: Payer: Self-pay | Admitting: Obstetrics and Gynecology

## 2021-09-16 VITALS — BP 134/80 | Ht 63.0 in | Wt 120.0 lb

## 2021-09-16 DIAGNOSIS — R350 Frequency of micturition: Secondary | ICD-10-CM | POA: Diagnosis not present

## 2021-09-16 DIAGNOSIS — N950 Postmenopausal bleeding: Secondary | ICD-10-CM

## 2021-09-16 DIAGNOSIS — Z833 Family history of diabetes mellitus: Secondary | ICD-10-CM

## 2021-09-16 DIAGNOSIS — Z124 Encounter for screening for malignant neoplasm of cervix: Secondary | ICD-10-CM | POA: Diagnosis not present

## 2021-09-16 DIAGNOSIS — Z853 Personal history of malignant neoplasm of breast: Secondary | ICD-10-CM

## 2021-09-16 DIAGNOSIS — E559 Vitamin D deficiency, unspecified: Secondary | ICD-10-CM

## 2021-09-16 DIAGNOSIS — Z0000 Encounter for general adult medical examination without abnormal findings: Secondary | ICD-10-CM | POA: Diagnosis not present

## 2021-09-16 DIAGNOSIS — Z01419 Encounter for gynecological examination (general) (routine) without abnormal findings: Secondary | ICD-10-CM

## 2021-09-16 DIAGNOSIS — Z8249 Family history of ischemic heart disease and other diseases of the circulatory system: Secondary | ICD-10-CM | POA: Diagnosis not present

## 2021-09-16 DIAGNOSIS — R61 Generalized hyperhidrosis: Secondary | ICD-10-CM | POA: Diagnosis not present

## 2021-09-16 NOTE — Progress Notes (Signed)
62 y.o. G12P3003 Married White or Caucasian Not Hispanic or Latino female here for annual exam.  ? ?She also c/o urinary frequency. Voiding normal amounts. Up 2 x at night to void.  ? ?H/O bilateral breast cancer, s/p bilateral mastectomies and reconstruction. Previously on tamoxifen. She went off of the tamoxifen at least 6 months ago. Doesn't feel well on it.  ? ?She has night sweats a couple of times a night, she is drenched.  ? ?H/O lichen sclerosis, biopsy proven.  ? ?In 12/21 she saw Marny Lowenstein, NP c/o an episode of vaginal bleeding. A w/u was ordered, but not done.  ?She notices intermittent blood when she wipes after voiding, occasionally notices it in her underwear.  ? ?Sexually active on occasion. She has some discomfort. Uses a lubricant, still uncomfortable. Not interested in the possibility of vaginal estrogen.  ? ?Mom died from lung cancer in 11-Apr-2023.  ? ?Brother currently with metastatic lung cancer.  ? ?Sister died from liver cancer. ? ?One living sister, doing okay.  ?  ? ?Patient's last menstrual period was 07/30/2016.          ?Sexually active: Yes.    ?The current method of family planning is post menopausal status.    ?Exercising: Yes.    Home exercise routine includes treadmill and walking 1 hrs per day. ?Smoker:  no ? ?Health Maintenance: ?Pap:  08/14/2017 ?History of abnormal Pap:  no ?MMG:  01/20/2014 double  after mastectomy ?BMD:   n/a ?Colonoscopy: 10/2016, repeat scheduled in June.  ?TDaP:  10 years ago  ?Gardasil: n/a ? ? reports that she has never smoked. She has never been exposed to tobacco smoke. She has never used smokeless tobacco. She reports that she does not drink alcohol and does not use drugs. Owns a heavy equipment company.  ?3 grown children, 5 grandchildren. Everyone is local.  ? ?Past Medical History:  ?Diagnosis Date  ? Asthma   ? adult onset  ? Breast cancer ([place]) 2015  ? bilateral, status post bilateral mastectomy BRCA testing negative  ? High cholesterol   ?  Hyperthyroidism   ? last check it was normal 3 months ago  ? 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 PONV (postoperative nausea and vomiting)   ? Recurrent upper respiratory infection (URI)   ? Urticaria   ? 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 ? ?Past Surgical History:  ?Procedure Laterality Date  ? AUGMENTATION MAMMAPLASTY Bilateral   ? 11/2014  ? BILATERAL TOTAL MASTECTOMY WITH AXILLARY LYMPH NODE DISSECTION  2015  ? BREAST SURGERY    ? Implants post bilateral mastectomy  ? CARDIAC CATHETERIZATION  2008  ? COLONOSCOPY  10/2013  ? polyps were normal  ? DILATATION & CURETTAGE/HYSTEROSCOPY WITH MYOSURE N/A 11/09/2014  ? Procedure: [place];  Surgeon: [name], MD;  Location: [facility];  Service: Gynecology;  Laterality: N/A;  1:00pm OR time requested. ? ?one hour needed ? ?does not need Myosure rep available.  ? MASTECTOMY Bilateral   ? 04/2014  ? ? ?Current Outpatient Medications  ?Medication Sig Dispense Refill  ? acetaminophen (TYLENOL) 500 MG tablet Take 1,000 mg by mouth 2 (two) times daily as needed for mild pain.    ? albuterol (PROVENTIL HFA;VENTOLIN HFA) 108 (90 BASE) MCG/ACT inhaler Inhale 2 puffs into the lungs every 6 (six) hours as needed for wheezing or shortness of breath (asthma).    ? ALPRAZolam (XANAX) 0.5 MG tablet Take 0.5  mg by mouth 4 (four) times daily as needed for anxiety or sleep. Take 1/2 to 1 tablet daily as needed for acute anxiety/insomnia    ? famotidine (PEPCID) 20 MG tablet Take 1 tablet (20 mg total) by mouth 2 (two) times daily. 30 tablet 0  ? Vitamin D, Ergocalciferol, (DRISDOL) 50000 units CAPS capsule Take 50,000 Units by mouth every 7 (seven) days.    ? ?No current facility-administered medications for this visit.  ? ? ?Family History  ?Problem Relation Age of Onset  ? Heart disease Father 28  ? Hypertension Father   ? Diabetes Father   ? Diabetes Mother   ? Allergic rhinitis Mother   ? Asthma Mother    ? Hypertension Sister   ? Diabetes Sister   ? Allergic rhinitis Sister   ? Asthma Sister   ? Hypertension Brother   ? Diabetes Brother   ? Stroke Brother   ? Cancer Brother   ?     Lung  ? Breast cancer Maternal Aunt   ?     40's  ? Breast cancer Maternal Grandmother 35  ? Cancer Maternal Grandfather   ?     colon  ? Heart disease Paternal Grandmother   ? Diabetes Paternal Grandmother   ? Heart disease Paternal Grandfather   ? Diabetes Paternal Grandfather   ? Breast cancer Maternal Aunt   ?     40's  ? Cancer Maternal Aunt   ?     brain; ? primary; deceased 17  ? Allergic rhinitis Sister   ? Asthma Sister   ? Allergic rhinitis Son   ? Asthma Son   ? Eczema Neg Hx   ? Urticaria Neg Hx   ? ? ?Review of Systems ? ?Exam:   ?BP 134/80 (BP Location: Right Arm, Patient Position: Sitting, Cuff Size: Normal)   Ht '5\' 3"'  (1.6 m)   Wt 120 lb (54.4 kg)   LMP 07/30/2016   BMI 21.26 kg/m?   Weight change: '@WEIGHTCHANGE' @ Height:   Height: '5\' 3"'  (160 cm)  ?Ht Readings from Last 3 Encounters:  ?09/16/21 '5\' 3"'  (1.6 m)  ?11/03/19 '5\' 4"'  (1.626 m)  ?01/06/19 '5\' 3"'  (1.6 m)  ? ? ?General appearance: alert, cooperative and appears stated age ?Head: Normocephalic, without obvious abnormality, atraumatic ?Neck: no adenopathy, supple, symmetrical, trachea midline and thyroid  ?Lungs: clear to auscultation bilaterally ?Cardiovascular: regular rate and rhythm ?Breasts:  bilateral implants are soft, no masses ?Abdomen: soft, non-tender; non distended,  no masses,  no organomegaly ?Extremities: extremities normal, atraumatic, no cyanosis or edema ?Skin: Skin color, texture, turgor normal. No rashes or lesions ?Lymph nodes: Cervical, supraclavicular, and axillary nodes normal. ?No abnormal inguinal nodes palpated ?Neurologic: Grossly normal ? ? ?Pelvic: External genitalia:  no lesions, some whitening on the external labia majora only, minimal agglutination of labia minora to majora ?             Urethra:  normal appearing urethra with no  masses, tenderness or lesions ?             Bartholins and Skenes: normal    ?             Vagina: atrophic appearing vagina with normal color and discharge, no lesions ?             Cervix: no lesions ?              ?Bimanual Exam:  Uterus:  normal size, contour, position, consistency,  mobility, non-tender ?             Adnexa: no mass, fullness, tenderness ?              Rectovaginal: Confirms ?              Anus:  normal sphincter tone, no lesions ? ?Marisa Sprinkles, CMA chaperoned for the exam. ? ? ?1. Well woman exam ?No mammogram needed ?Colonoscopy is scheduled ? ?2. Urinary frequency ?- Urinalysis,Complete w/RFL Culture ? ?3. Postmenopausal bleeding ?- US PELVIS TRANSVAGINAL NON-OB (TV ONLY); Future ? ?4. Screening for cervical cancer ?- Cytology - PAP ? ?5. Night sweats ?Discussed the option of estroven pm, effexor, and gabapentin ? ?6. Laboratory exam ordered as part of routine general medical examination ?- CBC ?- Comprehensive metabolic panel ? ?7. Family history of diabetes mellitus (DM) ?- Hemoglobin A1c ? ?8. Vitamin D deficiency ?- VITAMIN D 25 Hydroxy (Vit-D Deficiency, Fractures) ? ?9. History of bilateral breast cancer ?S/P bilateral mastectomies ? ?10. Family history of early CAD ?- Lipid panel ? ?

## 2021-09-16 NOTE — Patient Instructions (Signed)
Try uberlube for vaginal lubrication ? ?Consider Gabapentin for night sweats.  ? ?EXERCISE   We recommended that you start or continue a regular exercise program for good health. Physical activity is anything that gets your body moving, some is better than none. The CDC recommends 150 minutes per week of Moderate-Intensity Aerobic Activity and 2 or more days of Muscle Strengthening Activity. ? ?Benefits of exercise are limitless: helps weight loss/weight maintenance, improves mood and energy, helps with depression and anxiety, improves sleep, tones and strengthens muscles, improves balance, improves bone density, protects from chronic conditions such as heart disease, high blood pressure and diabetes and so much more. ?To learn more visit: WhyNotPoker.uy ? ?DIET: Good nutrition starts with a healthy diet of fruits, vegetables, whole grains, and lean protein sources. Drink plenty of water for hydration. Minimize empty calories, sodium, sweets. For more information about dietary recommendations visit: GeekRegister.com.ee and http://schaefer-mitchell.com/ ? ?ALCOHOL:  Women should limit their alcohol intake to no more than 7 drinks/beers/glasses of wine (combined, not each!) per week. Moderation of alcohol intake to this level decreases your risk of breast cancer and liver damage.  If you are concerned that you may have a problem, or your friends have told you they are concerned about your drinking, there are many resources to help. A well-known program that is free, effective, and available to all people all over the nation is Alcoholics Anonymous.  Check out this site to learn more: BlockTaxes.se ? ? ?CALCIUM AND VITAMIN D:  Adequate intake of calcium and Vitamin D are recommended for bone health.  You should be getting between 1000-1200 mg of calcium and 800 units of Vitamin D daily between diet and supplements ? ?PAP SMEARS:   Pap smears, to check for cervical cancer or precancers,  have traditionally been done yearly, scientific advances have shown that most women can have pap smears less often.  However, every woman still should have a physical exam from her gynecologist every year. It will include a breast check, inspection of the vulva and vagina to check for abnormal growths or skin changes, a visual exam of the cervix, and then an exam to evaluate the size and shape of the uterus and ovaries. We will also provide age appropriate advice regarding health maintenance, like when you should have certain vaccines, screening for sexually transmitted diseases, bone density testing, colonoscopy, mammograms, etc.  ? ?MAMMOGRAMS:  All women over 28 years old should have a routine mammogram.  ? ?COLON CANCER SCREENING: Now recommend starting at age 46. At this time colonoscopy is not covered for routine screening until 50. There are take home tests that can be done between 45-49.  ? ?COLONOSCOPY:  Colonoscopy to screen for colon cancer is recommended for all women at age 79.  We know, you hate the idea of the prep.  We agree, BUT, having colon cancer and not knowing it is worse!!  Colon cancer so often starts as a polyp that can be seen and removed at colonscopy, which can quite literally save your life!  And if your first colonoscopy is normal and you have no family history of colon cancer, most women don't have to have it again for 10 years.  Once every ten years, you can do something that may end up saving your life, right?  We will be happy to help you get it scheduled when you are ready.  Be sure to check your insurance coverage so you understand how much it will cost.  It may  be covered as a preventative service at no cost, but you should check your particular policy.   ? ? ? ?Breast Self-Awareness ?Breast self-awareness means being familiar with how your breasts look and feel. It involves checking your breasts regularly and reporting any  changes to your health care provider. ?Practicing breast self-awareness is important. A change in your breasts can be a sign of a serious medical problem. Being familiar with how your breasts look and feel allows you to find any problems early, when treatment is more likely to be successful. All women should practice breast self-awareness, including women who have had breast implants. ?How to do a breast self-exam ?One way to learn what is normal for your breasts and whether your breasts are changing is to do a breast self-exam. To do a breast self-exam: ?Look for Changes ? ?Remove all the clothing above your waist. ?Stand in front of a mirror in a room with good lighting. ?Put your hands on your hips. ?Push your hands firmly downward. ?Compare your breasts in the mirror. Look for differences between them (asymmetry), such as: ?Differences in shape. ?Differences in size. ?Puckers, dips, and bumps in one breast and not the other. ?Look at each breast for changes in your skin, such as: ?Redness. ?Scaly areas. ?Look for changes in your nipples, such as: ?Discharge. ?Bleeding. ?Dimpling. ?Redness. ?A change in position. ?Feel for Changes ?Carefully feel your breasts for lumps and changes. It is best to do this while lying on your back on the floor and again while sitting or standing in the shower or tub with soapy water on your skin. Feel each breast in the following way: ?Place the arm on the side of the breast you are examining above your head. ?Feel your breast with the other hand. ?Start in the nipple area and make ? inch (2 cm) overlapping circles to feel your breast. Use the pads of your three middle fingers to do this. Apply light pressure, then medium pressure, then firm pressure. The light pressure will allow you to feel the tissue closest to the skin. The medium pressure will allow you to feel the tissue that is a little deeper. The firm pressure will allow you to feel the tissue close to the ribs. ?Continue  the overlapping circles, moving downward over the breast until you feel your ribs below your breast. ?Move one finger-width toward the center of the body. Continue to use the ? inch (2 cm) overlapping circles to feel your breast as you move slowly up toward your collarbone. ?Continue the up and down exam using all three pressures until you reach your armpit. ? ?Write Down What You Find ? ?Write down what is normal for each breast and any changes that you find. Keep a written record with breast changes or normal findings for each breast. By writing this information down, you do not need to depend only on memory for size, tenderness, or location. Write down where you are in your menstrual cycle, if you are still menstruating. ?If you are having trouble noticing differences in your breasts, do not get discouraged. With time you will become more familiar with the variations in your breasts and more comfortable with the exam. ?How often should I examine my breasts? ?Examine your breasts every month. If you are breastfeeding, the best time to examine your breasts is after a feeding or after using a breast pump. If you menstruate, the best time to examine your breasts is 5-7 days after your period is  over. During your period, your breasts are lumpier, and it may be more difficult to notice changes. ?When should I see my health care provider? ?See your health care provider if you notice: ?A change in shape or size of your breasts or nipples. ?A change in the skin of your breast or nipples, such as a reddened or scaly area. ?Unusual discharge from your nipples. ?A lump or thick area that was not there before. ?Pain in your breasts. ?Anything that concerns you. ? ?

## 2021-09-17 LAB — LIPID PANEL
Cholesterol: 214 mg/dL — ABNORMAL HIGH (ref <200)
HDL: 82 mg/dL (ref >=50)
LDL Cholesterol (Calc): 116 mg/dL (calc) — ABNORMAL HIGH
Non-HDL Cholesterol (Calc): 132 mg/dL (calc) — ABNORMAL HIGH (ref <130)
Total CHOL/HDL Ratio: 2.6 (calc) (ref <5.0)
Triglycerides: 68 mg/dL (ref <150)

## 2021-09-17 LAB — COMPREHENSIVE METABOLIC PANEL
AG Ratio: 1.8 (calc) (ref 1.0–2.5)
ALT: 25 U/L (ref 6–29)
AST: 26 U/L (ref 10–35)
Albumin: 4.5 g/dL (ref 3.6–5.1)
Alkaline phosphatase (APISO): 67 U/L (ref 37–153)
BUN: 13 mg/dL (ref 7–25)
CO2: 27 mmol/L (ref 20–32)
Calcium: 9.8 mg/dL (ref 8.6–10.4)
Chloride: 105 mmol/L (ref 98–110)
Creat: 0.77 mg/dL (ref 0.50–1.05)
Globulin: 2.5 g/dL (calc) (ref 1.9–3.7)
Glucose, Bld: 88 mg/dL (ref 65–99)
Potassium: 4.4 mmol/L (ref 3.5–5.3)
Sodium: 141 mmol/L (ref 135–146)
Total Bilirubin: 0.4 mg/dL (ref 0.2–1.2)
Total Protein: 7 g/dL (ref 6.1–8.1)

## 2021-09-17 LAB — CYTOLOGY - PAP
Comment: NEGATIVE
Diagnosis: NEGATIVE
High risk HPV: NEGATIVE

## 2021-09-17 LAB — HEMOGLOBIN A1C
Hgb A1c MFr Bld: 5 % of total Hgb (ref <5.7)
Mean Plasma Glucose: 97 mg/dL
eAG (mmol/L): 5.4 mmol/L

## 2021-09-17 LAB — CBC
HCT: 40.3 % (ref 35.0–45.0)
Hemoglobin: 13.5 g/dL (ref 11.7–15.5)
MCH: 30.6 pg (ref 27.0–33.0)
MCHC: 33.5 g/dL (ref 32.0–36.0)
MCV: 91.4 fL (ref 80.0–100.0)
MPV: 10.7 fL (ref 7.5–12.5)
Platelets: 227 10*3/uL (ref 140–400)
RBC: 4.41 10*6/uL (ref 3.80–5.10)
RDW: 11.8 % (ref 11.0–15.0)
WBC: 5 10*3/uL (ref 3.8–10.8)

## 2021-09-17 LAB — VITAMIN D 25 HYDROXY (VIT D DEFICIENCY, FRACTURES): Vit D, 25-Hydroxy: 86 ng/mL (ref 30–100)

## 2021-09-18 LAB — URINALYSIS, COMPLETE W/RFL CULTURE
Bilirubin Urine: NEGATIVE
Glucose, UA: NEGATIVE
Hgb urine dipstick: NEGATIVE
Hyaline Cast: NONE SEEN /LPF
Ketones, ur: NEGATIVE
Nitrites, Initial: NEGATIVE
Protein, ur: NEGATIVE
RBC / HPF: NONE SEEN /HPF (ref 0–2)
Specific Gravity, Urine: 1.005 (ref 1.001–1.035)
pH: 6 (ref 5.0–8.0)

## 2021-09-18 LAB — CULTURE INDICATED

## 2021-09-18 LAB — URINE CULTURE
MICRO NUMBER:: 13333147
SPECIMEN QUALITY:: ADEQUATE

## 2021-09-26 DIAGNOSIS — Z800 Family history of malignant neoplasm of digestive organs: Secondary | ICD-10-CM | POA: Diagnosis not present

## 2021-09-26 DIAGNOSIS — K5730 Diverticulosis of large intestine without perforation or abscess without bleeding: Secondary | ICD-10-CM | POA: Diagnosis not present

## 2021-09-26 DIAGNOSIS — Z86010 Personal history of colonic polyps: Secondary | ICD-10-CM | POA: Diagnosis not present

## 2021-09-26 DIAGNOSIS — K5904 Chronic idiopathic constipation: Secondary | ICD-10-CM | POA: Diagnosis not present

## 2021-09-27 DIAGNOSIS — E0500 Thyrotoxicosis with diffuse goiter without thyrotoxic crisis or storm: Secondary | ICD-10-CM | POA: Diagnosis not present

## 2021-10-07 DIAGNOSIS — L820 Inflamed seborrheic keratosis: Secondary | ICD-10-CM | POA: Diagnosis not present

## 2021-10-11 NOTE — Progress Notes (Signed)
GYNECOLOGY  VISIT   HPI: 61 y.o.   Married White or Caucasian Not Hispanic or Latino  female   [id number] with Patient's last menstrual period was 07/30/2016.   here for [place] to eval for PMB. She has over a 1 year h/o light spotting, can occur a few times a week, typically notices after she voids. Occasionally notices it in her underwear.   H/O bilateral breast cancer, s/p bilateral mastectomies and reconstruction. Previously on tamoxifen. She went off of the tamoxifen at least 6 months ago.   She has vaginal atrophy, hasn't taken estrogen because of her h/o breast cancer. Not sexually active secondary to atrophy.  H/O lichen sclerosis.  In 5/23 she had a urinalysis which was negative for blood.    GYNECOLOGIC HISTORY: Patient's last menstrual period was 07/30/2016. Contraception: PM Menopausal hormone therapy: n/a        OB History     Gravida  3   Para  3   Term  3   Preterm      AB      Living  3      SAB      IAB      Ectopic      Multiple      Live Births                 Patient Active Problem List   Diagnosis Date Noted   Bilateral breast cancer ([place]) 01/16/2014   Chest pain 11/29/2013   Dyspnea 11/29/2013   Palpitations 11/29/2013   VIN (vulval intraepithelial neoplasia) I    CONSTIPATION 01/18/2009   WEIGHT GAIN 01/18/2009   BLOOD IN STOOL, OCCULT 01/18/2009   GERD 01/12/2009   NAUSEA 01/12/2009   VOMITING 01/12/2009   ABDOMINAL PAIN, UNSPECIFIED SITE 01/12/2009   DIABETES MELLITUS, GESTATIONAL, HX OF 01/12/2009    Past Medical History:  Diagnosis Date   Asthma    adult onset   Breast cancer ([place]) 2015   bilateral, status post bilateral mastectomy BRCA testing negative   High cholesterol    Hyperthyroidism    last check it was normal 3 months ago   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)    PONV (postoperative nausea and vomiting)    Recurrent upper respiratory infection (URI)    Urticaria    VIN (vulval  intraepithelial neoplasia) I 12/2004    Past Surgical History:  Procedure Laterality Date   AUGMENTATION MAMMAPLASTY Bilateral    11/2014   BILATERAL TOTAL MASTECTOMY WITH AXILLARY LYMPH NODE DISSECTION  2015   BREAST SURGERY     Implants post bilateral mastectomy   CARDIAC CATHETERIZATION  2008   COLONOSCOPY  10/2013   polyps were normal   DILATATION & CURETTAGE/HYSTEROSCOPY WITH MYOSURE N/A 11/09/2014   Procedure: DILATATION & CURETTAGE/HYSTEROSCOPY WITH MYOSURE;  Surgeon: [name], MD;  Location: [facility];  Service: Gynecology;  Laterality: N/A;  1:00pm OR time requested.  one hour needed  does not need Myosure rep available.   MASTECTOMY Bilateral    04/2014    Current Outpatient Medications  Medication Sig Dispense Refill   acetaminophen (TYLENOL) 500 MG tablet Take 1,000 mg by mouth 2 (two) times daily as needed for mild pain.     albuterol (PROVENTIL HFA;VENTOLIN HFA) 108 (90 BASE) MCG/ACT inhaler Inhale 2 puffs into the lungs every 6 (six) hours as needed for wheezing or shortness of breath (asthma).     ALPRAZolam (XANAX) 0.5 MG tablet Take 0.5 mg by mouth 4 (  four) times daily as needed for anxiety or sleep. Take 1/2 to 1 tablet daily as needed for acute anxiety/insomnia     famotidine (PEPCID) 20 MG tablet Take 1 tablet (20 mg total) by mouth 2 (two) times daily. 30 tablet 0   promethazine (PHENERGAN) 25 MG tablet 1 tab(s) orally every 6 hours for 30 days     Vitamin D, Ergocalciferol, (DRISDOL) 50000 units CAPS capsule Take 50,000 Units by mouth every 7 (seven) days.     zolpidem (AMBIEN) 10 MG tablet Take 10 mg by mouth at bedtime as needed.     No current facility-administered medications for this visit.     ALLERGIES: Zofran [ondansetron hcl]  Family History  Problem Relation Age of Onset   Heart disease Father 48   Hypertension Father    Diabetes Father    Diabetes Mother    Allergic rhinitis Mother    Asthma Mother    Hypertension Sister    Diabetes  Sister    Allergic rhinitis Sister    Asthma Sister    Hypertension Brother    Diabetes Brother    Stroke Brother    Cancer Brother        Lung   Breast cancer Maternal Aunt        40's   Breast cancer Maternal Grandmother 65   Cancer Maternal Grandfather        colon   Heart disease Paternal Grandmother    Diabetes Paternal Grandmother    Heart disease Paternal Grandfather    Diabetes Paternal Grandfather    Breast cancer Maternal Aunt        57's   Cancer Maternal Aunt        brain; ? primary; deceased 47   Allergic rhinitis Sister    Asthma Sister    Allergic rhinitis Son    Asthma Son    Eczema Neg Hx    Urticaria Neg Hx     Social History   Socioeconomic History   Marital status: Married    Spouse name: Not on file   Number of children: 3   Years of education: Not on file   Highest education level: Not on file  Occupational History    Employer: [employer]    Comment: Agricultural consultant  Tobacco Use   Smoking status: Never    Passive exposure: Never   Smokeless tobacco: Never  Vaping Use   Vaping Use: Never used  Substance and Sexual Activity   Alcohol use: No    Alcohol/week: 0.0 standard drinks   Drug use: No   Sexual activity: Yes    Comment: intercourse age 53, sexual partners less than 5  Other Topics Concern   Not on file  Social History Narrative   Not on file   Social Determinants of Health   Financial Resource Strain: Not on file  Food Insecurity: Not on file  Transportation Needs: Not on file  Physical Activity: Not on file  Stress: Not on file  Social Connections: Not on file  Intimate Partner Violence: Not on file    Review of Systems  All other systems reviewed and are negative.  Ultrasound: see report. Thin endometrial stripe of 1.5 mm. 2 small intramural myomas, normal atrophic ovaries bilaterally.   PHYSICAL EXAMINATION:    BP (!) 142/90   Pulse 72   LMP 07/30/2016   SpO2 92%     General appearance: alert, cooperative and  appears stated age  Pelvic: External genitalia:  no  lesions, atrophic, small lacerations noted at the introitus (s/p ultrasound), some whitening on the external labia majora only, minimal agglutination of labia minora to majora              Urethra:  normal appearing urethra with no masses, tenderness or lesions              Bartholins and Skenes: normal                 Vagina: very atrophic appearing vagina with normal color and discharge, no lesions              Cervix: no lesions               Chaperone was present for exam.  1. Postmenopausal bleeding Very light spotting several times a week. Reassuring pelvic ultrasound with thin stripe. She has vulvar and vaginal atrophic. Could be bleeding from her thin vaginal mucosa or tears externally. H/O bilateral breast cancer and bilateral mastectomies. -I will check with her Oncologist about the possibility of vaginal estrogen -Discussed using vaseline externally -Discussed patting the vulva dry instead of wiping.  -Recommended she f/u on a day she is spotting to try and determine the source -If she continues to bleed without clear source consider sonohysterogram/endometrial biopsy  2. Vaginal atrophy See above  3. Lichen sclerosus See above  4. History of bilateral breast cancer See above.   CC: Dr Lindi Adie  In addition to reviewing the ultrasound results, over 20 minutes were spent in history, exam and management.

## 2021-10-22 ENCOUNTER — Ambulatory Visit (INDEPENDENT_AMBULATORY_CARE_PROVIDER_SITE_OTHER): Payer: BC Managed Care – PPO

## 2021-10-22 ENCOUNTER — Encounter: Payer: Self-pay | Admitting: Obstetrics and Gynecology

## 2021-10-22 ENCOUNTER — Ambulatory Visit: Payer: BC Managed Care – PPO | Admitting: Obstetrics and Gynecology

## 2021-10-22 VITALS — BP 142/90 | HR 72

## 2021-10-22 DIAGNOSIS — Z853 Personal history of malignant neoplasm of breast: Secondary | ICD-10-CM

## 2021-10-22 DIAGNOSIS — L900 Lichen sclerosus et atrophicus: Secondary | ICD-10-CM

## 2021-10-22 DIAGNOSIS — N952 Postmenopausal atrophic vaginitis: Secondary | ICD-10-CM

## 2021-10-22 DIAGNOSIS — N950 Postmenopausal bleeding: Secondary | ICD-10-CM

## 2022-04-07 DIAGNOSIS — Z853 Personal history of malignant neoplasm of breast: Secondary | ICD-10-CM | POA: Diagnosis not present

## 2022-05-01 DIAGNOSIS — Z9289 Personal history of other medical treatment: Secondary | ICD-10-CM | POA: Diagnosis not present

## 2022-05-01 DIAGNOSIS — M79621 Pain in right upper arm: Secondary | ICD-10-CM | POA: Diagnosis not present

## 2022-05-01 DIAGNOSIS — R590 Localized enlarged lymph nodes: Secondary | ICD-10-CM | POA: Diagnosis not present

## 2022-05-01 DIAGNOSIS — Z853 Personal history of malignant neoplasm of breast: Secondary | ICD-10-CM | POA: Diagnosis not present

## 2022-06-03 DIAGNOSIS — I700 Atherosclerosis of aorta: Secondary | ICD-10-CM | POA: Diagnosis not present

## 2022-06-03 DIAGNOSIS — K635 Polyp of colon: Secondary | ICD-10-CM | POA: Diagnosis not present

## 2022-06-03 DIAGNOSIS — E559 Vitamin D deficiency, unspecified: Secondary | ICD-10-CM | POA: Diagnosis not present

## 2022-06-03 DIAGNOSIS — E0500 Thyrotoxicosis with diffuse goiter without thyrotoxic crisis or storm: Secondary | ICD-10-CM | POA: Diagnosis not present

## 2022-11-26 DIAGNOSIS — D2239 Melanocytic nevi of other parts of face: Secondary | ICD-10-CM | POA: Diagnosis not present

## 2022-11-26 DIAGNOSIS — L2089 Other atopic dermatitis: Secondary | ICD-10-CM | POA: Diagnosis not present

## 2022-11-26 DIAGNOSIS — L0109 Other impetigo: Secondary | ICD-10-CM | POA: Diagnosis not present

## 2022-11-26 DIAGNOSIS — L438 Other lichen planus: Secondary | ICD-10-CM | POA: Diagnosis not present

## 2022-12-11 DIAGNOSIS — L218 Other seborrheic dermatitis: Secondary | ICD-10-CM | POA: Diagnosis not present

## 2022-12-11 DIAGNOSIS — L821 Other seborrheic keratosis: Secondary | ICD-10-CM | POA: Diagnosis not present

## 2022-12-11 DIAGNOSIS — L814 Other melanin hyperpigmentation: Secondary | ICD-10-CM | POA: Diagnosis not present

## 2022-12-11 DIAGNOSIS — D2271 Melanocytic nevi of right lower limb, including hip: Secondary | ICD-10-CM | POA: Diagnosis not present

## 2022-12-29 ENCOUNTER — Encounter: Payer: Self-pay | Admitting: Nurse Practitioner

## 2022-12-29 ENCOUNTER — Ambulatory Visit (INDEPENDENT_AMBULATORY_CARE_PROVIDER_SITE_OTHER): Payer: BC Managed Care – PPO | Admitting: Nurse Practitioner

## 2022-12-29 VITALS — BP 110/64 | HR 72 | Resp 16 | Ht 62.5 in | Wt 121.0 lb

## 2022-12-29 DIAGNOSIS — E559 Vitamin D deficiency, unspecified: Secondary | ICD-10-CM

## 2022-12-29 DIAGNOSIS — E041 Nontoxic single thyroid nodule: Secondary | ICD-10-CM | POA: Diagnosis not present

## 2022-12-29 DIAGNOSIS — E785 Hyperlipidemia, unspecified: Secondary | ICD-10-CM | POA: Diagnosis not present

## 2022-12-29 DIAGNOSIS — Z01419 Encounter for gynecological examination (general) (routine) without abnormal findings: Secondary | ICD-10-CM | POA: Diagnosis not present

## 2022-12-29 DIAGNOSIS — R58 Hemorrhage, not elsewhere classified: Secondary | ICD-10-CM | POA: Diagnosis not present

## 2022-12-29 DIAGNOSIS — R319 Hematuria, unspecified: Secondary | ICD-10-CM | POA: Diagnosis not present

## 2022-12-29 DIAGNOSIS — L900 Lichen sclerosus et atrophicus: Secondary | ICD-10-CM | POA: Diagnosis not present

## 2022-12-29 LAB — CBC WITH DIFFERENTIAL/PLATELET
Absolute Monocytes: 330 cells/uL (ref 200–950)
Basophils Absolute: 41 cells/uL (ref 0–200)
Basophils Relative: 0.7 %
Eosinophils Absolute: 41 cells/uL (ref 15–500)
Eosinophils Relative: 0.7 %
HCT: 41.2 % (ref 35.0–45.0)
Hemoglobin: 13.8 g/dL (ref 11.7–15.5)
Lymphs Abs: 1593 cells/uL (ref 850–3900)
MCH: 30.6 pg (ref 27.0–33.0)
MCHC: 33.5 g/dL (ref 32.0–36.0)
MCV: 91.4 fL (ref 80.0–100.0)
MPV: 10.6 fL (ref 7.5–12.5)
Monocytes Relative: 5.6 %
Neutro Abs: 3894 cells/uL (ref 1500–7800)
Neutrophils Relative %: 66 %
Platelets: 259 10*3/uL (ref 140–400)
RBC: 4.51 10*6/uL (ref 3.80–5.10)
RDW: 11.9 % (ref 11.0–15.0)
Total Lymphocyte: 27 %
WBC: 5.9 10*3/uL (ref 3.8–10.8)

## 2022-12-29 LAB — URINALYSIS, COMPLETE W/RFL CULTURE
Bacteria, UA: NONE SEEN /HPF
Bilirubin Urine: NEGATIVE
Glucose, UA: NEGATIVE
Hgb urine dipstick: NEGATIVE
Hyaline Cast: NONE SEEN /LPF
Ketones, ur: NEGATIVE
Nitrites, Initial: NEGATIVE
Protein, ur: NEGATIVE
RBC / HPF: NONE SEEN /HPF (ref 0–2)
Specific Gravity, Urine: 1.004 (ref 1.001–1.035)
pH: 6 (ref 5.0–8.0)

## 2022-12-29 LAB — CULTURE INDICATED

## 2022-12-29 NOTE — Progress Notes (Unsigned)
   Brandy [name] [birth year] [id number]   History:  62 y.o. G3P3003 presents for annual exam. Postmenopausal - no HRT. PMB last year, thin endometrium on ultrasound. Still has occasional light bleeding. Sees in in the toilet after urinating. No vaginal pain, rarely sexually active. No dysuria, frequency or urgency. Normal pap history. H/O lichen sclerosus, biopsy-proven, no symptoms. H/O 2015 bilateral breast cancer, s/p bilateral mastectomies and reconstruction. Previously on tamoxifen but stopped last year due to not feeling well on it. Noticed a knot in neck a few weeks ago. No pain, no affecting swallowing. Used to see endocrinology. Reports abnormal TSH in the past.  Gynecologic History Patient's last menstrual period was 07/30/2016.   Contraception/Family planning: post menopausal status Sexually active: No  Health Maintenance Last Pap: 09/16/2021. Results were: Normal neg HPV, 5-year repeat Last mammogram: 01/20/2014 Last colonoscopy: 11/05/2016, 5-year recall Last Dexa: Never  Past medical history, past surgical history, family history and social history were all reviewed and documented in the EPIC chart. 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   ROS:  A ROS was performed and pertinent positives and negatives are included.  Exam:  Vitals:   12/29/22 1603  BP: 110/64  Pulse: 72  Resp: 16  Weight: 121 lb (54.9 kg)  Height: 5' 2.5" (1.588 m)   Body mass index is 21.78 kg/m.  General appearance:  Normal Thyroid:  ~1 cm mobile mass superior to thyroid gland, also enlarged cervical lymph node to left of nodule. Non-tender.  Respiratory  Auscultation:  Clear without wheezing or rhonchi Cardiovascular  Auscultation:  Regular rate, without rubs, murmurs or gallops  Edema/varicosities:  Not grossly evident Abdominal  Soft,nontender, without masses, guarding or rebound.  Liver/spleen:  No organomegaly noted  Hernia:  None appreciated  Skin  Inspection:  Grossly  normal Breasts: Examined lying and sitting. Double mastectomy with reconstruction.  Genitourinary   Inguinal/mons:  Normal without inguinal adenopathy  External genitalia:  Normal appearing vulva with no masses, tenderness, or lesions  BUS/Urethra/Skene's glands:  Normal  Vagina:  Normal appearing with normal color and discharge, no lesions  Cervix:  Normal appearing without discharge or lesions  Uterus:  Normal in size, shape and contour.  Midline and mobile, nontender  Adnexa/parametria:     Rt: Normal in size, without masses or tenderness.   Lt: Normal in size, without masses or tenderness.  Anus and perineum: Normal  Digital rectal exam: Deferred  Patient informed chaperone available to be present for breast and pelvic exam. Patient has requested no chaperone to be present. Patient has been advised what will be completed during breast and pelvic exam.   Assessment/Plan:  62 y.o. G3P3003 for annual exam.   Well female exam with routine gynecological exam - Plan: CBC with Differential/Platelet, Comprehensive metabolic panel  Vitamin D deficiency - Plan: VITAMIN D 25 Hydroxy (Vit-D Deficiency, Fractures)  Thyroid nodule - Plan: Thyroid Panel With TSH  Hyperlipidemia, unspecified hyperlipidemia type - Plan: Lipid panel  Hematuria, unspecified type - Plan: Urinalysis,Complete w/RFL Culture    Return in 1 year for annual or sooner if needed.   Olivia Mackie DNP, 4:38 PM 12/29/2022

## 2022-12-30 ENCOUNTER — Telehealth: Payer: Self-pay

## 2022-12-30 DIAGNOSIS — E041 Nontoxic single thyroid nodule: Secondary | ICD-10-CM

## 2022-12-30 DIAGNOSIS — R591 Generalized enlarged lymph nodes: Secondary | ICD-10-CM

## 2022-12-30 NOTE — Telephone Encounter (Signed)
-----   Message from Brandy [name] sent at 12/29/2022  4:40 PM EDT ----- Regarding: Neck and thyroid ultrasound Please send referral for ultrasound of thyroid and soft tissue of neck for mass and lymphadenopathy. Thanks.

## 2022-12-30 NOTE — Telephone Encounter (Signed)
Order placed and pt notified to call and schedule appt via mychart msg.

## 2022-12-31 ENCOUNTER — Other Ambulatory Visit: Payer: Self-pay | Admitting: Nurse Practitioner

## 2022-12-31 DIAGNOSIS — N3000 Acute cystitis without hematuria: Secondary | ICD-10-CM

## 2022-12-31 MED ORDER — NITROFURANTOIN MONOHYD MACRO 100 MG PO CAPS
100.0000 mg | ORAL_CAPSULE | Freq: Two times a day (BID) | ORAL | 0 refills | Status: AC
Start: 2022-12-31 — End: ?

## 2023-01-05 NOTE — Telephone Encounter (Signed)
Per appt notes:  "01/02/23 2ND ATTEMPT/EPIC/LMOM/KG 12/30/2022 1ST ATTEMPT/LVM/EPIC/DT"

## 2023-01-06 DIAGNOSIS — R7989 Other specified abnormal findings of blood chemistry: Secondary | ICD-10-CM | POA: Diagnosis not present

## 2023-01-06 DIAGNOSIS — E0500 Thyrotoxicosis with diffuse goiter without thyrotoxic crisis or storm: Secondary | ICD-10-CM | POA: Diagnosis not present

## 2023-01-09 NOTE — Telephone Encounter (Signed)
Pt scheduled for 01/14/2023. Routing to provider for final review and closing encounter.

## 2023-01-14 ENCOUNTER — Ambulatory Visit
Admission: RE | Admit: 2023-01-14 | Discharge: 2023-01-14 | Disposition: A | Discharge status: Home / Self Care | Payer: BC Managed Care – PPO | Source: Ambulatory Visit | Attending: Nurse Practitioner | Admitting: Nurse Practitioner

## 2023-01-14 DIAGNOSIS — R591 Generalized enlarged lymph nodes: Secondary | ICD-10-CM

## 2023-01-14 DIAGNOSIS — E041 Nontoxic single thyroid nodule: Secondary | ICD-10-CM

## 2023-02-12 DIAGNOSIS — E0500 Thyrotoxicosis with diffuse goiter without thyrotoxic crisis or storm: Secondary | ICD-10-CM | POA: Diagnosis not present

## 2023-09-15 DIAGNOSIS — Z1211 Encounter for screening for malignant neoplasm of colon: Secondary | ICD-10-CM | POA: Diagnosis not present

## 2023-09-15 DIAGNOSIS — K625 Hemorrhage of anus and rectum: Secondary | ICD-10-CM | POA: Diagnosis not present

## 2023-09-15 DIAGNOSIS — K5730 Diverticulosis of large intestine without perforation or abscess without bleeding: Secondary | ICD-10-CM | POA: Diagnosis not present

## 2023-09-15 DIAGNOSIS — Z860100 Personal history of colon polyps, unspecified: Secondary | ICD-10-CM | POA: Diagnosis not present

## 2023-10-14 DIAGNOSIS — K648 Other hemorrhoids: Secondary | ICD-10-CM | POA: Diagnosis not present

## 2023-10-14 DIAGNOSIS — K625 Hemorrhage of anus and rectum: Secondary | ICD-10-CM | POA: Diagnosis not present

## 2023-10-14 DIAGNOSIS — K5730 Diverticulosis of large intestine without perforation or abscess without bleeding: Secondary | ICD-10-CM | POA: Diagnosis not present

## 2023-10-14 DIAGNOSIS — K6389 Other specified diseases of intestine: Secondary | ICD-10-CM | POA: Diagnosis not present

## 2023-10-14 DIAGNOSIS — D125 Benign neoplasm of sigmoid colon: Secondary | ICD-10-CM | POA: Diagnosis not present

## 2023-10-14 DIAGNOSIS — Z1211 Encounter for screening for malignant neoplasm of colon: Secondary | ICD-10-CM | POA: Diagnosis not present

## 2023-10-14 DIAGNOSIS — K921 Melena: Secondary | ICD-10-CM | POA: Diagnosis not present

## 2023-10-14 DIAGNOSIS — K635 Polyp of colon: Secondary | ICD-10-CM | POA: Diagnosis not present

## 2023-10-14 DIAGNOSIS — Z860100 Personal history of colon polyps, unspecified: Secondary | ICD-10-CM | POA: Diagnosis not present

## 2023-10-14 DIAGNOSIS — R110 Nausea: Secondary | ICD-10-CM | POA: Diagnosis not present

## 2023-10-14 DIAGNOSIS — Z860101 Personal history of adenomatous and serrated colon polyps: Secondary | ICD-10-CM | POA: Diagnosis not present

## 2023-11-10 DIAGNOSIS — K635 Polyp of colon: Secondary | ICD-10-CM | POA: Diagnosis not present

## 2023-11-10 DIAGNOSIS — R030 Elevated blood-pressure reading, without diagnosis of hypertension: Secondary | ICD-10-CM | POA: Diagnosis not present

## 2023-11-10 DIAGNOSIS — E0500 Thyrotoxicosis with diffuse goiter without thyrotoxic crisis or storm: Secondary | ICD-10-CM | POA: Diagnosis not present

## 2023-11-10 DIAGNOSIS — E559 Vitamin D deficiency, unspecified: Secondary | ICD-10-CM | POA: Diagnosis not present

## 2024-01-08 ENCOUNTER — Ambulatory Visit: Admitting: Nurse Practitioner

## 2024-01-08 NOTE — Progress Notes (Deleted)
   Brandy [name] 10/06/1960 [id number]   History:  63 y.o. G3P3003 presents for annual exam. Postmenopausal - no HRT. H/O lichen sclerosus, biopsy-proven, no symptoms. Normal pap history. H/O 2015 bilateral breast cancer, s/p bilateral mastectomies and reconstruction. Previously on tamoxifen  but stopped last year due to not feeling well on it.   Gynecologic History Patient's last menstrual period was 07/30/2016.   Contraception/Family planning: post menopausal status Sexually active: No  Health Maintenance Last Pap: 09/16/2021. Results were: Normal neg HPV Last mammogram: 01/20/2014 Last colonoscopy: 11/05/2016, 5-year recall Last Dexa: Never      No data to display           Past medical history, past surgical history, family history and social history were all reviewed and documented in the EPIC chart. 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   ROS:  A ROS was performed and pertinent positives and negatives are included.  Exam:  There were no vitals filed for this visit.  There is no height or weight on file to calculate BMI.  General appearance:  Normal Thyroid :  ~1 cm mobile mass superior to thyroid  gland, also enlarged cervical lymph node to left of nodule. Non-tender.  Respiratory  Auscultation:  Clear without wheezing or rhonchi Cardiovascular  Auscultation:  Regular rate, without rubs, murmurs or gallops  Edema/varicosities:  Not grossly evident Abdominal  Soft,nontender, without masses, guarding or rebound.  Liver/spleen:  No organomegaly noted  Hernia:  None appreciated  Skin  Inspection:  Grossly normal Breasts: Double mastectomy with reconstruction.  Genitourinary   Inguinal/mons:  Normal without inguinal adenopathy  External genitalia:  Pearly appearance c/w LS, no evidence of bleeding or excoriation  BUS/Urethra/Skene's glands:  Normal  Vagina:  Normal appearing with normal color and discharge, no lesions  Cervix:  Normal appearing without  discharge or lesions  Uterus:  Normal in size, shape and contour.  Midline and mobile, nontender  Adnexa/parametria:     Rt: Normal in size, without masses or tenderness.   Lt: Normal in size, without masses or tenderness.  Anus and perineum: Hypopigmentation c/w LS  Digital rectal exam: Deferred  Dereck Keas, CMA present as chaperone.   Assessment/Plan:  63 y.o. G3P3003 for annual exam.   Well female exam with routine gynecological exam - Education provided on SBEs, importance of preventative screenings, current guidelines, high calcium diet, regular exercise, and multivitamin daily. Has PCP.   Lichen sclerosus - Asymptomatic. Skin changes present.   Screening for cervical cancer - Normal Pap history.  Will repeat at 5-year interval per guidelines.  Screening for breast cancer - Double mastectomy. No longer screening.   Screening for colon cancer - 2018 colonoscopy. Due now and encouraged to schedule.  Screening for osteoporosis - Average risk. Will plan DXA at age 89.   No follow-ups on file.    [name] DNP, 3:08 PM 01/08/2024

## 2024-01-25 DIAGNOSIS — L820 Inflamed seborrheic keratosis: Secondary | ICD-10-CM | POA: Diagnosis not present

## 2024-01-25 DIAGNOSIS — D2272 Melanocytic nevi of left lower limb, including hip: Secondary | ICD-10-CM | POA: Diagnosis not present

## 2024-01-25 DIAGNOSIS — L814 Other melanin hyperpigmentation: Secondary | ICD-10-CM | POA: Diagnosis not present

## 2024-01-25 DIAGNOSIS — L821 Other seborrheic keratosis: Secondary | ICD-10-CM | POA: Diagnosis not present

## 2024-02-29 ENCOUNTER — Other Ambulatory Visit: Payer: Self-pay | Admitting: Gastroenterology

## 2024-02-29 DIAGNOSIS — R1011 Right upper quadrant pain: Secondary | ICD-10-CM

## 2024-03-02 ENCOUNTER — Other Ambulatory Visit

## 2024-03-07 ENCOUNTER — Ambulatory Visit
Admission: RE | Admit: 2024-03-07 | Discharge: 2024-03-07 | Disposition: A | Source: Ambulatory Visit | Attending: Gastroenterology | Admitting: Gastroenterology

## 2024-03-07 DIAGNOSIS — R1011 Right upper quadrant pain: Secondary | ICD-10-CM | POA: Diagnosis not present

## 2024-04-20 DIAGNOSIS — Z1212 Encounter for screening for malignant neoplasm of rectum: Secondary | ICD-10-CM | POA: Diagnosis not present

## 2024-04-20 DIAGNOSIS — E0500 Thyrotoxicosis with diffuse goiter without thyrotoxic crisis or storm: Secondary | ICD-10-CM | POA: Diagnosis not present

## 2024-05-03 ENCOUNTER — Other Ambulatory Visit: Payer: Self-pay | Admitting: Endocrinology

## 2024-05-03 DIAGNOSIS — Z853 Personal history of malignant neoplasm of breast: Secondary | ICD-10-CM

## 2024-05-29 ENCOUNTER — Ambulatory Visit
Admission: RE | Admit: 2024-05-29 | Discharge: 2024-05-29 | Disposition: A | Source: Ambulatory Visit | Attending: Endocrinology | Admitting: Endocrinology

## 2024-05-29 DIAGNOSIS — Z853 Personal history of malignant neoplasm of breast: Secondary | ICD-10-CM

## 2024-05-29 MED ORDER — GADOPICLENOL 0.5 MMOL/ML IV SOLN
6.0000 mL | Freq: Once | INTRAVENOUS | Status: AC | PRN
  Administered 2024-05-29: 6 mL via INTRAVENOUS

## 2024-05-30 ENCOUNTER — Other Ambulatory Visit: Payer: Self-pay | Admitting: Endocrinology

## 2024-05-30 DIAGNOSIS — Z853 Personal history of malignant neoplasm of breast: Secondary | ICD-10-CM

## 2024-06-05 ENCOUNTER — Inpatient Hospital Stay: Admission: RE | Admit: 2024-06-05 | Source: Ambulatory Visit
